# Patient Record
Sex: Male | Born: 1950 | Race: White | Hispanic: No | Marital: Single | Smoking: Former smoker
Health system: Southern US, Community
[De-identification: ages and names within clinical notes are randomized; demographics above are authoritative.]

## PROBLEM LIST (undated history)

## (undated) DIAGNOSIS — I420 Dilated cardiomyopathy: Secondary | ICD-10-CM

## (undated) DIAGNOSIS — N390 Urinary tract infection, site not specified: Secondary | ICD-10-CM

## (undated) DIAGNOSIS — I10 Essential (primary) hypertension: Secondary | ICD-10-CM

## (undated) DIAGNOSIS — M1990 Unspecified osteoarthritis, unspecified site: Secondary | ICD-10-CM

## (undated) DIAGNOSIS — N200 Calculus of kidney: Secondary | ICD-10-CM

## (undated) DIAGNOSIS — N1330 Unspecified hydronephrosis: Secondary | ICD-10-CM

## (undated) DIAGNOSIS — R739 Hyperglycemia, unspecified: Secondary | ICD-10-CM

## (undated) DIAGNOSIS — K8020 Calculus of gallbladder without cholecystitis without obstruction: Secondary | ICD-10-CM

## (undated) DIAGNOSIS — E119 Type 2 diabetes mellitus without complications: Secondary | ICD-10-CM

## (undated) HISTORY — PX: KNEE ARTHROSCOPY: SUR90

## (undated) HISTORY — PX: NOSE SURGERY: SHX723

## (undated) HISTORY — DX: Urinary tract infection, site not specified: N39.0

## (undated) HISTORY — DX: Dilated cardiomyopathy: I42.0

## (undated) HISTORY — DX: Unspecified osteoarthritis, unspecified site: M19.90

## (undated) HISTORY — DX: Type 2 diabetes mellitus without complications: E11.9

## (undated) HISTORY — DX: Calculus of gallbladder without cholecystitis without obstruction: K80.20

---

## 2009-08-20 ENCOUNTER — Ambulatory Visit: Payer: Self-pay | Admitting: Family Medicine

## 2009-08-26 ENCOUNTER — Ambulatory Visit (HOSPITAL_COMMUNITY): Admission: RE | Admit: 2009-08-26 | Discharge: 2009-08-26 | Payer: Self-pay | Admitting: Family Medicine

## 2009-09-09 ENCOUNTER — Encounter (INDEPENDENT_AMBULATORY_CARE_PROVIDER_SITE_OTHER): Payer: Self-pay | Admitting: Family Medicine

## 2009-09-09 ENCOUNTER — Ambulatory Visit: Payer: Self-pay | Admitting: Internal Medicine

## 2009-09-09 LAB — CONVERTED CEMR LAB
Alkaline Phosphatase: 77 units/L (ref 39–117)
Basophils Absolute: 0 10*3/uL (ref 0.0–0.1)
Basophils Relative: 0 % (ref 0–1)
Cholesterol: 302 mg/dL — ABNORMAL HIGH (ref 0–200)
Eosinophils Absolute: 0.2 10*3/uL (ref 0.0–0.7)
Eosinophils Relative: 3 % (ref 0–5)
Glucose, Bld: 284 mg/dL — ABNORMAL HIGH (ref 70–99)
HCT: 49.1 % (ref 39.0–52.0)
HDL: 37 mg/dL — ABNORMAL LOW (ref >39)
Hemoglobin: 16.3 g/dL (ref 13.0–17.0)
LDL Cholesterol: 204 mg/dL — ABNORMAL HIGH (ref 0–99)
Lymphocytes Relative: 27 % (ref 12–46)
MCV: 91.3 fL (ref 78.0–100.0)
Monocytes Absolute: 0.5 10*3/uL (ref 0.1–1.0)
RDW: 13.5 % (ref 11.5–15.5)
Sodium: 134 meq/L — ABNORMAL LOW (ref 135–145)
Total CHOL/HDL Ratio: 8.2
Total Protein: 7 g/dL (ref 6.0–8.3)
Triglycerides: 304 mg/dL — ABNORMAL HIGH (ref <150)
VLDL: 61 mg/dL — ABNORMAL HIGH (ref 0–40)

## 2009-09-18 ENCOUNTER — Ambulatory Visit: Payer: Self-pay | Admitting: Internal Medicine

## 2009-10-04 ENCOUNTER — Encounter (INDEPENDENT_AMBULATORY_CARE_PROVIDER_SITE_OTHER): Payer: Self-pay | Admitting: Family Medicine

## 2009-10-04 ENCOUNTER — Ambulatory Visit: Payer: Self-pay | Admitting: Internal Medicine

## 2009-10-04 LAB — CONVERTED CEMR LAB
ALT: 14 units/L (ref 0–53)
Albumin: 4.6 g/dL (ref 3.5–5.2)
Alkaline Phosphatase: 67 units/L (ref 39–117)
CO2: 25 meq/L (ref 19–32)
Calcium: 9.3 mg/dL (ref 8.4–10.5)
Chloride: 102 meq/L (ref 96–112)
Total Bilirubin: 0.4 mg/dL (ref 0.3–1.2)

## 2009-10-16 ENCOUNTER — Ambulatory Visit: Payer: Self-pay | Admitting: Internal Medicine

## 2018-04-04 ENCOUNTER — Encounter (HOSPITAL_COMMUNITY): Payer: Self-pay

## 2018-04-04 ENCOUNTER — Ambulatory Visit (HOSPITAL_COMMUNITY)
Admission: EM | Admit: 2018-04-04 | Discharge: 2018-04-04 | Disposition: A | Discharge status: Home / Self Care | Payer: Self-pay | Attending: Family Medicine | Admitting: Family Medicine

## 2018-04-04 DIAGNOSIS — R112 Nausea with vomiting, unspecified: Secondary | ICD-10-CM

## 2018-04-04 MED ORDER — ONDANSETRON 4 MG PO TBDP: 4.0000 mg | ORAL_TABLET | Freq: Three times a day (TID) | ORAL | 0 refills | Status: DC | PRN

## 2018-04-04 NOTE — ED Provider Notes (Signed)
[facility]    CSN: [id number] Arrival date & time: 04/04/18  1650     History   Chief Complaint Chief Complaint  Patient presents with  . Emesis  . URI    HPI LAMOUNT BANKSON is a 67 y.o. male.   Patient started vomiting about 48 hours ago.  He denies any pain but has had some hiccups.  He denies postnasal drainage and while in triage notes that he was having symptoms of congestion and drainage he denies that when I asked him.  He did have some wheezing last week but says that resolved.  Also denies cough.  HPI  History reviewed. No pertinent past medical history.  There are no active problems to display for this patient.   Past Surgical History:  Procedure Laterality Date  . KNEE ARTHROSCOPY    . NOSE SURGERY         Home Medications    Prior to Admission medications   Not on File    Family History History reviewed. No pertinent family history.  Social History Social History   Tobacco Use  . Smoking status: Not on file  Substance Use Topics  . Alcohol use: Not on file  . Drug use: Not on file     Allergies   Patient has no known allergies.   Review of Systems Review of Systems  Constitutional: Negative.   HENT: Negative.   Respiratory: Negative.   Cardiovascular: Negative.   Gastrointestinal: Positive for vomiting.  Genitourinary: Negative.   Musculoskeletal: Negative.   Neurological: Negative.      Physical Exam Triage Vital Signs ED Triage Vitals  Enc Vitals Group     BP 04/04/18 1746 121/80     Pulse Rate 04/04/18 1746 (!) 108     Resp 04/04/18 1746 18     Temp 04/04/18 1746 97.7 F (36.5 C)     Temp Source 04/04/18 1746 Oral     SpO2 04/04/18 1746 94 %     Weight --      Height --      Head Circumference --      Peak Flow --      Pain Score 04/04/18 1747 0     Pain Loc --      Pain Edu? --      Excl. in GC? --    No data found.  Updated Vital Signs BP 121/80 (BP Location: Left Arm)   Pulse (!) 108    Temp 97.7 F (36.5 C) (Oral)   Resp 18   SpO2 94%   Visual Acuity Right Eye Distance:   Left Eye Distance:   Bilateral Distance:    Right Eye Near:   Left Eye Near:    Bilateral Near:     Physical Exam  Constitutional: He is oriented to person, place, and time. He appears well-developed and well-nourished.  HENT:  Mouth/Throat: Oropharynx is clear and moist.  Cardiovascular: Normal rate and regular rhythm.  Pulmonary/Chest: Effort normal and breath sounds normal.  Abdominal: Soft. Bowel sounds are normal.  Neurological: He is alert and oriented to person, place, and time.     UC Treatments / Results  Labs (all labs ordered are listed, but only abnormal results are displayed) Labs Reviewed - No data to display  EKG None  Radiology No results found.  Procedures Procedures (including critical care time)  Medications Ordered in UC Medications - No data to display  Initial Impression / Assessment and Plan / UC  Course  I have reviewed the triage vital signs and the nursing notes.  Pertinent labs & imaging results that were available during my care of the patient were reviewed by me and considered in my medical decision making (see chart for details).     Vomiting of uncertain etiology.  Usual contributors including GI problems like ulcers pancreatitis cholecystitis are negative by history.  He denies postnasal drainage.  Will treat symptomatically with Zofran Final Clinical Impressions(s) / UC Diagnoses   Final diagnoses:  None   Discharge Instructions   None    ED Prescriptions    None     Controlled Substance Prescriptions [place] Controlled Substance Registry consulted? No   Frederica Kuster, MD 04/04/18 [phone]

## 2018-04-04 NOTE — ED Triage Notes (Signed)
Pt presents with vomiting and cold symptoms; nasal drainage and congestion.

## 2018-04-25 DIAGNOSIS — R69 Illness, unspecified: Secondary | ICD-10-CM | POA: Diagnosis not present

## 2018-04-25 DIAGNOSIS — I10 Essential (primary) hypertension: Secondary | ICD-10-CM | POA: Diagnosis not present

## 2018-04-25 DIAGNOSIS — R55 Syncope and collapse: Secondary | ICD-10-CM | POA: Diagnosis not present

## 2018-04-25 DIAGNOSIS — K529 Noninfective gastroenteritis and colitis, unspecified: Secondary | ICD-10-CM | POA: Diagnosis not present

## 2018-05-03 ENCOUNTER — Other Ambulatory Visit: Payer: Self-pay

## 2018-05-03 ENCOUNTER — Encounter (HOSPITAL_COMMUNITY): Payer: Self-pay | Admitting: Emergency Medicine

## 2018-05-03 ENCOUNTER — Inpatient Hospital Stay (HOSPITAL_COMMUNITY)
Admission: EM | Admit: 2018-05-03 | Discharge: 2018-05-19 | DRG: 216 | Disposition: A | Discharge status: Home Health | Payer: Medicare HMO | Attending: Thoracic Surgery (Cardiothoracic Vascular Surgery) | Admitting: Thoracic Surgery (Cardiothoracic Vascular Surgery)

## 2018-05-03 ENCOUNTER — Emergency Department (HOSPITAL_COMMUNITY): Payer: Medicare HMO

## 2018-05-03 DIAGNOSIS — Z23 Encounter for immunization: Secondary | ICD-10-CM | POA: Diagnosis not present

## 2018-05-03 DIAGNOSIS — I110 Hypertensive heart disease with heart failure: Principal | ICD-10-CM | POA: Diagnosis present

## 2018-05-03 DIAGNOSIS — D62 Acute posthemorrhagic anemia: Secondary | ICD-10-CM | POA: Diagnosis not present

## 2018-05-03 DIAGNOSIS — I509 Heart failure, unspecified: Secondary | ICD-10-CM | POA: Diagnosis not present

## 2018-05-03 DIAGNOSIS — I5021 Acute systolic (congestive) heart failure: Secondary | ICD-10-CM | POA: Diagnosis not present

## 2018-05-03 DIAGNOSIS — R05 Cough: Secondary | ICD-10-CM | POA: Diagnosis not present

## 2018-05-03 DIAGNOSIS — I5041 Acute combined systolic (congestive) and diastolic (congestive) heart failure: Secondary | ICD-10-CM | POA: Diagnosis not present

## 2018-05-03 DIAGNOSIS — I420 Dilated cardiomyopathy: Secondary | ICD-10-CM | POA: Diagnosis present

## 2018-05-03 DIAGNOSIS — J810 Acute pulmonary edema: Secondary | ICD-10-CM | POA: Diagnosis not present

## 2018-05-03 DIAGNOSIS — I248 Other forms of acute ischemic heart disease: Secondary | ICD-10-CM

## 2018-05-03 DIAGNOSIS — Z87891 Personal history of nicotine dependence: Secondary | ICD-10-CM

## 2018-05-03 DIAGNOSIS — R079 Chest pain, unspecified: Secondary | ICD-10-CM | POA: Diagnosis not present

## 2018-05-03 DIAGNOSIS — I25110 Atherosclerotic heart disease of native coronary artery with unstable angina pectoris: Secondary | ICD-10-CM | POA: Diagnosis not present

## 2018-05-03 DIAGNOSIS — I959 Hypotension, unspecified: Secondary | ICD-10-CM | POA: Diagnosis present

## 2018-05-03 DIAGNOSIS — I340 Nonrheumatic mitral (valve) insufficiency: Secondary | ICD-10-CM | POA: Diagnosis not present

## 2018-05-03 DIAGNOSIS — I214 Non-ST elevation (NSTEMI) myocardial infarction: Secondary | ICD-10-CM | POA: Diagnosis present

## 2018-05-03 DIAGNOSIS — Z79899 Other long term (current) drug therapy: Secondary | ICD-10-CM | POA: Diagnosis not present

## 2018-05-03 DIAGNOSIS — Z823 Family history of stroke: Secondary | ICD-10-CM

## 2018-05-03 DIAGNOSIS — R7989 Other specified abnormal findings of blood chemistry: Secondary | ICD-10-CM

## 2018-05-03 DIAGNOSIS — Z951 Presence of aortocoronary bypass graft: Secondary | ICD-10-CM

## 2018-05-03 DIAGNOSIS — Z01810 Encounter for preprocedural cardiovascular examination: Secondary | ICD-10-CM | POA: Diagnosis not present

## 2018-05-03 DIAGNOSIS — R059 Cough, unspecified: Secondary | ICD-10-CM

## 2018-05-03 DIAGNOSIS — I255 Ischemic cardiomyopathy: Secondary | ICD-10-CM | POA: Diagnosis present

## 2018-05-03 DIAGNOSIS — I10 Essential (primary) hypertension: Secondary | ICD-10-CM

## 2018-05-03 DIAGNOSIS — J9811 Atelectasis: Secondary | ICD-10-CM | POA: Diagnosis not present

## 2018-05-03 DIAGNOSIS — I5043 Acute on chronic combined systolic (congestive) and diastolic (congestive) heart failure: Secondary | ICD-10-CM | POA: Diagnosis not present

## 2018-05-03 DIAGNOSIS — E1165 Type 2 diabetes mellitus with hyperglycemia: Secondary | ICD-10-CM | POA: Diagnosis present

## 2018-05-03 DIAGNOSIS — I4891 Unspecified atrial fibrillation: Secondary | ICD-10-CM | POA: Diagnosis not present

## 2018-05-03 DIAGNOSIS — J811 Chronic pulmonary edema: Secondary | ICD-10-CM | POA: Diagnosis not present

## 2018-05-03 DIAGNOSIS — E119 Type 2 diabetes mellitus without complications: Secondary | ICD-10-CM | POA: Diagnosis not present

## 2018-05-03 DIAGNOSIS — R531 Weakness: Secondary | ICD-10-CM | POA: Diagnosis not present

## 2018-05-03 DIAGNOSIS — I272 Pulmonary hypertension, unspecified: Secondary | ICD-10-CM | POA: Diagnosis present

## 2018-05-03 DIAGNOSIS — K5900 Constipation, unspecified: Secondary | ICD-10-CM | POA: Diagnosis not present

## 2018-05-03 DIAGNOSIS — R0602 Shortness of breath: Secondary | ICD-10-CM | POA: Diagnosis not present

## 2018-05-03 DIAGNOSIS — I25810 Atherosclerosis of coronary artery bypass graft(s) without angina pectoris: Secondary | ICD-10-CM

## 2018-05-03 DIAGNOSIS — I2510 Atherosclerotic heart disease of native coronary artery without angina pectoris: Secondary | ICD-10-CM | POA: Diagnosis not present

## 2018-05-03 DIAGNOSIS — Z954 Presence of other heart-valve replacement: Secondary | ICD-10-CM | POA: Diagnosis not present

## 2018-05-03 DIAGNOSIS — E875 Hyperkalemia: Secondary | ICD-10-CM | POA: Diagnosis present

## 2018-05-03 DIAGNOSIS — I25119 Atherosclerotic heart disease of native coronary artery with unspecified angina pectoris: Secondary | ICD-10-CM | POA: Diagnosis not present

## 2018-05-03 DIAGNOSIS — J90 Pleural effusion, not elsewhere classified: Secondary | ICD-10-CM | POA: Diagnosis not present

## 2018-05-03 DIAGNOSIS — E785 Hyperlipidemia, unspecified: Secondary | ICD-10-CM | POA: Diagnosis present

## 2018-05-03 DIAGNOSIS — Z9889 Other specified postprocedural states: Secondary | ICD-10-CM

## 2018-05-03 DIAGNOSIS — E782 Mixed hyperlipidemia: Secondary | ICD-10-CM

## 2018-05-03 DIAGNOSIS — T17920A Food in respiratory tract, part unspecified causing asphyxiation, initial encounter: Secondary | ICD-10-CM | POA: Diagnosis not present

## 2018-05-03 DIAGNOSIS — R110 Nausea: Secondary | ICD-10-CM | POA: Diagnosis not present

## 2018-05-03 DIAGNOSIS — Z8249 Family history of ischemic heart disease and other diseases of the circulatory system: Secondary | ICD-10-CM | POA: Diagnosis not present

## 2018-05-03 DIAGNOSIS — I341 Nonrheumatic mitral (valve) prolapse: Secondary | ICD-10-CM | POA: Diagnosis not present

## 2018-05-03 DIAGNOSIS — I493 Ventricular premature depolarization: Secondary | ICD-10-CM | POA: Diagnosis not present

## 2018-05-03 LAB — GLUCOSE, CAPILLARY: GLUCOSE-CAPILLARY: 123 mg/dL — AB (ref 70–99)

## 2018-05-03 LAB — COMPREHENSIVE METABOLIC PANEL
ALT: 35 U/L (ref 0–44)
ANION GAP: 13 (ref 5–15)
AST: 34 U/L (ref 15–41)
Albumin: 3.7 g/dL (ref 3.5–5.0)
Alkaline Phosphatase: 45 U/L (ref 38–126)
BUN: 9 mg/dL (ref 8–23)
CALCIUM: 9.1 mg/dL (ref 8.9–10.3)
CO2: 21 mmol/L — AB (ref 22–32)
Chloride: 97 mmol/L — ABNORMAL LOW (ref 98–111)
Creatinine, Ser: 1.12 mg/dL (ref 0.61–1.24)
GFR calc Af Amer: >60 mL/min (ref >60)
Glucose, Bld: 176 mg/dL — ABNORMAL HIGH (ref 70–99)
POTASSIUM: 4.2 mmol/L (ref 3.5–5.1)
Sodium: 131 mmol/L — ABNORMAL LOW (ref 135–145)
TOTAL PROTEIN: 6.8 g/dL (ref 6.5–8.1)
Total Bilirubin: 1.9 mg/dL — ABNORMAL HIGH (ref 0.3–1.2)

## 2018-05-03 LAB — TROPONIN I
Troponin I: 0.05 ng/mL (ref <0.03)
Troponin I: 0.09 ng/mL (ref <0.03)

## 2018-05-03 LAB — CBC WITH DIFFERENTIAL/PLATELET
Abs Immature Granulocytes: 0.03 10*3/uL (ref 0.00–0.07)
BASOS ABS: 0.1 10*3/uL (ref 0.0–0.1)
BASOS PCT: 1 %
Eosinophils Absolute: 0.1 10*3/uL (ref 0.0–0.5)
Eosinophils Relative: 1 %
HCT: 45.3 % (ref 39.0–52.0)
Hemoglobin: 14.1 g/dL (ref 13.0–17.0)
IMMATURE GRANULOCYTES: 0 %
Lymphocytes Relative: 20 %
Lymphs Abs: 1.6 10*3/uL (ref 0.7–4.0)
MCH: 29.4 pg (ref 26.0–34.0)
MCHC: 31.1 g/dL (ref 30.0–36.0)
MCV: 94.4 fL (ref 80.0–100.0)
Monocytes Absolute: 0.8 10*3/uL (ref 0.1–1.0)
Monocytes Relative: 10 %
NEUTROS ABS: 5.5 10*3/uL (ref 1.7–7.7)
NEUTROS PCT: 68 %
NRBC: 0 % (ref 0.0–0.2)
PLATELETS: 363 10*3/uL (ref 150–400)
RBC: 4.8 MIL/uL (ref 4.22–5.81)
RDW: 13.4 % (ref 11.5–15.5)
WBC: 8.1 10*3/uL (ref 4.0–10.5)

## 2018-05-03 LAB — I-STAT TROPONIN, ED: TROPONIN I, POC: 0.09 ng/mL — AB (ref 0.00–0.08)

## 2018-05-03 LAB — CBG MONITORING, ED: GLUCOSE-CAPILLARY: 144 mg/dL — AB (ref 70–99)

## 2018-05-03 LAB — BRAIN NATRIURETIC PEPTIDE: B NATRIURETIC PEPTIDE 5: 2703.3 pg/mL — AB (ref 0.0–100.0)

## 2018-05-03 MED ORDER — INSULIN ASPART 100 UNIT/ML ~~LOC~~ SOLN: 0.0000 [IU] | Freq: Every day | SUBCUTANEOUS | Status: DC

## 2018-05-03 MED ORDER — ENOXAPARIN SODIUM 40 MG/0.4ML ~~LOC~~ SOLN: 40.0000 mg | SUBCUTANEOUS | Status: DC

## 2018-05-03 MED ORDER — ATORVASTATIN CALCIUM 80 MG PO TABS
80.0000 mg | ORAL_TABLET | Freq: Every day | ORAL | Status: DC
  Administered 2018-05-03 – 2018-05-12 (×10): 80 mg via ORAL
  Filled 2018-05-03 (×11): qty 1

## 2018-05-03 MED ORDER — ONDANSETRON HCL 4 MG PO TABS: 4.0000 mg | ORAL_TABLET | Freq: Four times a day (QID) | ORAL | Status: DC | PRN

## 2018-05-03 MED ORDER — ACETAMINOPHEN 650 MG RE SUPP: 650.0000 mg | Freq: Four times a day (QID) | RECTAL | Status: DC | PRN

## 2018-05-03 MED ORDER — FUROSEMIDE 10 MG/ML IJ SOLN
40.0000 mg | Freq: Four times a day (QID) | INTRAMUSCULAR | Status: DC
  Administered 2018-05-04 – 2018-05-05 (×7): 40 mg via INTRAVENOUS
  Filled 2018-05-03 (×7): qty 4

## 2018-05-03 MED ORDER — FUROSEMIDE 10 MG/ML IJ SOLN
40.0000 mg | Freq: Two times a day (BID) | INTRAMUSCULAR | Status: DC
  Administered 2018-05-03: 40 mg via INTRAVENOUS
  Filled 2018-05-03: qty 4

## 2018-05-03 MED ORDER — SODIUM CHLORIDE 0.9% FLUSH: 3.0000 mL | INTRAVENOUS | Status: DC | PRN

## 2018-05-03 MED ORDER — SODIUM CHLORIDE 0.9 % IV SOLN
250.0000 mL | INTRAVENOUS | Status: DC | PRN
  Administered 2018-05-13: 15:00:00 via INTRAVENOUS

## 2018-05-03 MED ORDER — ONDANSETRON HCL 4 MG/2ML IJ SOLN: 4.0000 mg | Freq: Four times a day (QID) | INTRAMUSCULAR | Status: DC | PRN

## 2018-05-03 MED ORDER — ZOLPIDEM TARTRATE 5 MG PO TABS: 5.0000 mg | ORAL_TABLET | Freq: Every evening | ORAL | Status: DC | PRN

## 2018-05-03 MED ORDER — SODIUM CHLORIDE 0.9% FLUSH
3.0000 mL | Freq: Two times a day (BID) | INTRAVENOUS | Status: DC
  Administered 2018-05-03 – 2018-05-12 (×10): 3 mL via INTRAVENOUS

## 2018-05-03 MED ORDER — HEPARIN (PORCINE) 25000 UT/250ML-% IV SOLN
1400.0000 [IU]/h | INTRAVENOUS | Status: DC
  Administered 2018-05-03: 950 [IU]/h via INTRAVENOUS
  Filled 2018-05-03: qty 250

## 2018-05-03 MED ORDER — INSULIN ASPART 100 UNIT/ML ~~LOC~~ SOLN
0.0000 [IU] | Freq: Three times a day (TID) | SUBCUTANEOUS | Status: DC
  Administered 2018-05-03 – 2018-05-06 (×5): 1 [IU] via SUBCUTANEOUS
  Administered 2018-05-07 – 2018-05-08 (×2): 2 [IU] via SUBCUTANEOUS
  Administered 2018-05-08: 1 [IU] via SUBCUTANEOUS
  Administered 2018-05-08 – 2018-05-09 (×2): 2 [IU] via SUBCUTANEOUS
  Administered 2018-05-09 – 2018-05-10 (×2): 1 [IU] via SUBCUTANEOUS
  Administered 2018-05-10: 2 [IU] via SUBCUTANEOUS
  Administered 2018-05-10 – 2018-05-11 (×3): 1 [IU] via SUBCUTANEOUS
  Administered 2018-05-11 – 2018-05-12 (×2): 2 [IU] via SUBCUTANEOUS
  Filled 2018-05-03: qty 1

## 2018-05-03 MED ORDER — IPRATROPIUM-ALBUTEROL 0.5-2.5 (3) MG/3ML IN SOLN
3.0000 mL | Freq: Once | RESPIRATORY_TRACT | Status: AC
  Administered 2018-05-03: 3 mL via RESPIRATORY_TRACT
  Filled 2018-05-03: qty 3

## 2018-05-03 MED ORDER — FUROSEMIDE 10 MG/ML IJ SOLN
40.0000 mg | Freq: Once | INTRAMUSCULAR | Status: AC
  Administered 2018-05-03: 40 mg via INTRAVENOUS
  Filled 2018-05-03: qty 4

## 2018-05-03 MED ORDER — INFLUENZA VAC SPLIT QUAD 0.5 ML IM SUSY
0.5000 mL | PREFILLED_SYRINGE | INTRAMUSCULAR | Status: AC
  Administered 2018-05-08: 0.5 mL via INTRAMUSCULAR
  Filled 2018-05-03: qty 0.5

## 2018-05-03 MED ORDER — ACETAMINOPHEN 325 MG PO TABS: 650.0000 mg | ORAL_TABLET | Freq: Four times a day (QID) | ORAL | Status: DC | PRN

## 2018-05-03 MED ORDER — HEPARIN BOLUS VIA INFUSION
4000.0000 [IU] | Freq: Once | INTRAVENOUS | Status: AC
  Administered 2018-05-03: 4000 [IU] via INTRAVENOUS
  Filled 2018-05-03: qty 4000

## 2018-05-03 NOTE — H&P (Signed)
[facility]                                                                                    Patient Demographics  Michael Banks, is a 67 y.o. male  CSN: [id number]  MRN: [id number]  DOB - 06/05/1951  Admit Date - 05/03/2018  Outpatient Primary MD for the patient is Patient, No Pcp Per   With History of -  No past medical history on file.    Past Surgical History:  Procedure Laterality Date  . KNEE ARTHROSCOPY    . NOSE SURGERY      in for   Chief Complaint  Patient presents with  . Shortness of Breath     HPI  Michael Banks  is a 67 y.o. male, with past medical history significant for self treated hypertension on no medications, presenting with 1 week history of increasing shortness of breath but no chest pain or lower extremity edema.  Patient has no history of coronary artery disease or congestive heart failure but reports that around 1 week ago he started having left arm pain and numbness on exercise that was relieved by sitting down.  Patient denies frank chest pain or palpitations.  In the emergency room the patient was found to be in pulmonary edema and his BNP was 2703.  His troponin was 0.09.  Cardiology was consulted as well    Review of Systems    In addition to the HPI above,  No Fever-chills, No Headache, No changes with Vision or hearing, No problems swallowing food or Liquids, No Chest pain,  No Abdominal pain, No Nausea or Vommitting, Bowel movements are regular, No Blood in stool or Urine, No dysuria, No new skin rashes or bruises, No new joints pains-aches,  No new weakness, tingling, numbness in any extremity, No recent weight gain or loss, No polyuria, polydypsia or polyphagia, No significant Mental Stressors.  A full 10 point Review of Systems was done, except as stated above, all other Review of Systems were negative.   Social History Social History   Tobacco Use  . Smoking status: Former Games developermoker  . Smokeless  tobacco: Never Used  Substance Use Topics  . Alcohol use: Never    Frequency: Never     Family History  For hypertension in his father  Prior to Admission medications   Medication Sig Start Date End Date Taking? Authorizing Provider  DM-Doxylamine-Acetaminophen (NYQUIL COLD & FLU PO) Take 15 mLs by mouth as needed (cold).   Yes [provider]  ondansetron (ZOFRAN ODT) 4 MG disintegrating tablet Take 1 tablet (4 mg total) by mouth every 8 (eight) hours as needed for nausea or vomiting. 04/04/18  Yes Frederica KusterMiller, Stephen M, MD    No Known Allergies  Physical Exam  Vitals  Blood pressure 106/81, pulse (!) 107, resp. rate (!) 22, SpO2 98 %.   1. General Young male, well-developed, anxious  2. Not Suicidal or Homicidal, Awake Alert, Oriented X 3.  3. No F.N deficits, grossly.  4. Ears and Eyes appear Normal, Conjunctivae clear, PERRLA. Moist Oral Mucosa.  5. Supple Neck, No JVD, No cervical lymphadenopathy appriciated, No Carotid Bruits.  6. Symmetrical Chest wall movement, bilateral basilar crackles with decreased breath sounds at the bases as well.  7. RRR, tachycardic.  8. Positive Bowel Sounds, Abdomen Soft, Non tender, No organomegaly appriciated,No rebound -guarding or rigidity.  9.  No Cyanosis, Normal Skin Turgor, No Skin Rash or Bruise.  10. Good muscle tone,  joints appear normal , no effusions, Normal ROM.    Data Review  CBC Recent Labs  Lab 05/03/18 1324  WBC 8.1  HGB 14.1  HCT 45.3  PLT 363  MCV 94.4  MCH 29.4  MCHC 31.1  RDW 13.4  LYMPHSABS 1.6  MONOABS 0.8  EOSABS 0.1  BASOSABS 0.1   ------------------------------------------------------------------------------------------------------------------  Chemistries  Recent Labs  Lab 05/03/18 1324  NA 131*  K 4.2  CL 97*  CO2 21*  GLUCOSE 176*  BUN 9  CREATININE 1.12  CALCIUM 9.1  AST 34  ALT 35  ALKPHOS 45  BILITOT 1.9*    ------------------------------------------------------------------------------------------------------------------ CrCl cannot be calculated (Unknown ideal weight.). ------------------------------------------------------------------------------------------------------------------ No results for input(s): TSH, T4TOTAL, T3FREE, THYROIDAB in the last 72 hours.  Invalid input(s): FREET3   Coagulation profile No results for input(s): INR, PROTIME in the last 168 hours. ------------------------------------------------------------------------------------------------------------------- No results for input(s): DDIMER in the last 72 hours. -------------------------------------------------------------------------------------------------------------------  Cardiac Enzymes No results for input(s): CKMB, TROPONINI, MYOGLOBIN in the last 168 hours.  Invalid input(s): CK ------------------------------------------------------------------------------------------------------------------ Invalid input(s): POCBNP   ---------------------------------------------------------------------------------------------------------------  Urinalysis No results found for: COLORURINE, APPEARANCEUR, LABSPEC, PHURINE, GLUCOSEU, HGBUR, BILIRUBINUR, KETONESUR, PROTEINUR, UROBILINOGEN, NITRITE, LEUKOCYTESUR  ----------------------------------------------------------------------------------------------------------------   Imaging results:   Dg Chest 2 View  Result Date: 05/03/2018 CLINICAL DATA:  Cough and shortness of breath EXAM: CHEST - 2 VIEW COMPARISON:  None. FINDINGS: There are small pleural effusions bilaterally. There is atelectatic change in the left base. There is cardiomegaly with mild pulmonary venous hypertension. There is underlying interstitial edema. No frank consolidation. No adenopathy evident. There is degenerative change in the thoracic spine. IMPRESSION: Pulmonary vascular congestion. Small  pleural effusions bilaterally with interstitial edema. There may be a degree of underlying congestive heart failure. No frank consolidation, although there is atelectatic change in the left base. Electronically Signed   By: Bretta Bang III M.D.   On: 05/03/2018 13:51    My personal review of EKG: Sinus tach at 113 bpm with LVH  Assessment & Plan  Pulmonary edema/congestive heart failure, unknown type Check echocardiogram Serial troponins Lasix IV Cardiology consulted  Elevated troponins Check serial troponin  History of hypertension, not on medications  Elevated blood sugar, check hemoglobin A1c and start insulin sliding scale   DVT Prophylaxis Lovenox  AM Labs Ordered, also please review Full Orders    Code Status full  Disposition Plan: Home  Time spent in minutes : 44 minutes  Condition GUARDED   @SIGNATURE @

## 2018-05-03 NOTE — ED Notes (Signed)
Pt transported to xray 

## 2018-05-03 NOTE — Progress Notes (Signed)
ANTICOAGULATION CONSULT NOTE - Initial Consult  Pharmacy Consult for Heparin Indication: chest pain/ACS  No Known Allergies  Patient Measurements: Height: 5\' 10"  (177.8 cm) Weight: 175 lb (79.4 kg) IBW/kg (Calculated) : 73 Heparin Dosing Weight: 79.4   Vital Signs: BP: 106/78 (11/12 1845) Pulse Rate: 106 (11/12 1845)  Labs: Recent Labs    05/03/18 1324 05/03/18 1706  HGB 14.1  --   HCT 45.3  --   PLT 363  --   CREATININE 1.12  --   TROPONINI  --  0.05*    Estimated Creatinine Clearance: 68.8 mL/min (by C-G formula based on SCr of 1.12 mg/dL).   Medical History: No past medical history on file.  Assessment: Patient is 25 yoM presenting with shortness of breath and left arm pain with elevated troponins. Pharmacy consulted for heparin dosing. No anticoagulation PTA. CBC wnl.   Goal of Therapy:  Heparin level 0.3-0.7 units/ml Monitor platelets by anticoagulation protocol: Yes   Plan:  Give 4000 units bolus x 1 Start heparin infusion at 950 units/hr Check anti-Xa level in 6 hours and daily while on heparin Continue to monitor H&H and platelets  Monitor s/sx bleed  Chauncey Mann, Pharmacy Student

## 2018-05-03 NOTE — ED Notes (Signed)
Pt satting 88% on room air while standing to urinate. Pt placed on 2L O2, brought up to 96% and returned to bed.

## 2018-05-03 NOTE — Progress Notes (Signed)
  Vital Signs MEWS/VS Documentation      05/03/2018 1845 05/03/2018 1900 05/03/2018 1930 05/03/2018 2002   MEWS Score:  1  0  0  2   MEWS Score Color:  Green  Green  Green  Yellow   Resp:  16  16  -  20   Pulse:  (!) 106  100  99  (!) 105   BP:  106/78  110/85  102/83  92/70   Temp:  -  -  -  98.6 F (37 C)   O2 Device:  -  Nasal Cannula  -  Nasal Cannula   O2 Flow Rate (L/min):  -  2 L/min  -  2 L/min           Suzan SlickAshley N Tobias-Diakun 05/03/2018,10:39 PM

## 2018-05-03 NOTE — Consult Note (Addendum)
Cardiology Consult    Patient ID: Michael Banks MRN: [id number], DOB/AGE: 02/19/1951   Admit date: 05/03/2018 Date of Consult: 05/03/2018  Primary Physician: Patient, No Pcp Per Primary Cardiologist: Tobias Alexander, MD (New Consult) Requesting Provider: Geoffery Lyons, MD (Emergency Department)  Patient Profile    Michael Banks is a 67 y.o. male with a history of hypertension, hyperlipidemia, and tobacco abuse, who is being seen today for the evaluation of shortness of breath at the request of Dr. Judd Lien.  History of Present Illness    Mr. Michael Banks is a 67 year old male with no known CAD who does has never had any cardiac workup. Patient recently presented to the ED on 04/04/2018 with nausea and vomiting. Etiology unknown but patient was discharged with Zofran. Since then, patient reports worsening shortness of breath with exertion, orthopnea, and PND over the last 3 weeks as well as general fatigue and weakness. Patient reports shortness of breath with minimal exertion and has even had to stop and sit down and catch his breath. His orthopnea and PND have also been so bad that he has not been able to get much sleep. He has noticed worsening abdominal bloating over the past couple of weeks as well. Patient also reports a productive cough for the past week. He denies any chest pain but does report a couple of episodes of left arm pain with exertion over the last 1.5 months that resolved with Aspirin. Patient states he watches his sodium intake and typically follows the DASH diet. He was previously diagnosed with hypertension and hyperlipidemia and was started on Atenolol and Lipitor but those medications were reportedly discontinued by his PCP due to improvement of his blood pressure and cholesterol. He last saw his PCP last week after going 10 years without being seen.   Upon arrival to the ED, patient mildly tachycardic at 113 bpm but vitals stable. EKG showed sinus tachycardia (rate 113 bpm)  and LVH with secondary repolarization abnormalities. I-stat troponin minimally elevated at 0.09. Chest x-ray showed pulmonary vascular congestion with small pleural effusions bilaterally and interstitial edema. BNP elevated at 2,703.3. CBC unremarkable. Na 131, K 4.2, Glucose 176, SCr 1.12. Patient given IV Lasix 40mg  and a Duoneb in the ED.   Patient has a 30+ pack year smoking history but recently quit last week after moving into a place that does not allow smoking. He reports previous alcohol and marijuana use but none currently. He does have a family history of CAD with his brother dying of a "heart attack" at 62 years old.   Past Medical History   No past medical history on file.  Past Surgical History:  Procedure Laterality Date  . KNEE ARTHROSCOPY    . NOSE SURGERY       Allergies  No Known Allergies  Inpatient Medications      Family History    Family History  Problem Relation Age of Onset  . CVA Father        died of "cerebral hemorrhage"  . CAD Brother        died of "heart attack" at age 32   He indicated that his mother is deceased. He indicated that his father is deceased. He indicated that the status of his brother is unknown.   Social History    Social History   Socioeconomic History  . Marital status: Single    Spouse name: Not on file  . Number of children: Not on file  . Years of  education: Not on file  . Highest education level: Not on file  Occupational History  . Not on file  Social Needs  . Financial resource strain: Not on file  . Food insecurity:    Worry: Not on file    Inability: Not on file  . Transportation needs:    Medical: Not on file    Non-medical: Not on file  Tobacco Use  . Smoking status: Former Games developer  . Smokeless tobacco: Never Used  Substance and Sexual Activity  . Alcohol use: Never    Frequency: Never  . Drug use: Not on file  . Sexual activity: Not on file  Lifestyle  . Physical activity:    Days per week: Not on  file    Minutes per session: Not on file  . Stress: Not on file  Relationships  . Social connections:    Talks on phone: Not on file    Gets together: Not on file    Attends religious service: Not on file    Active member of club or organization: Not on file    Attends meetings of clubs or organizations: Not on file    Relationship status: Not on file  . Intimate partner violence:    Fear of current or ex partner: Not on file    Emotionally abused: Not on file    Physically abused: Not on file    Forced sexual activity: Not on file  Other Topics Concern  . Not on file  Social History Narrative  . Not on file     Review of Systems    Review of Systems  Constitutional: Positive for malaise/fatigue. Negative for chills, diaphoresis and fever.  HENT: Negative for congestion and sore throat.   Eyes: Positive for blurred vision.  Respiratory: Positive for cough, sputum production and shortness of breath.   Cardiovascular: Positive for orthopnea, leg swelling and PND. Negative for chest pain and palpitations.  Gastrointestinal: Negative for abdominal pain, diarrhea, nausea and vomiting.  Musculoskeletal: Negative for falls and myalgias.       Left arm pain with exertion  Skin: Positive for rash (eczema).  Neurological: Positive for tingling (feet at night) and weakness. Negative for dizziness, loss of consciousness and headaches.  Endo/Heme/Allergies: Negative for environmental allergies.  Psychiatric/Behavioral: Positive for substance abuse (tobacco use).    Physical Exam    Blood pressure (!) 112/91, pulse (!) 108, resp. rate 17, SpO2 94 %.  General: 67 y.o. Caucasian male resting comfortably in no acute distress. Pleasant and cooperative. Currently on supplemental O2 via nasal cannula. HEENT: Normal  Neck: Supple. Elevated JVD. No carotid bruits. Lungs: No increased work of breathing. Minimal crackles in right lower lobe but lungs relatively clear. No wheezes or rhonchi  appreciated. Heart: Tachycardic with regular rhythm. Distinct S1 and S2. II/VI systolic murmur best heard at apex. No gallops or rubs appreciated. Abdomen: Soft, non-distended, and non-tender to palpation. Bowel sounds present.   Extremities: Mild bilateral lower extremity edema. Radial pulses 2+ and equal bilaterally. Neuro: Alert and oriented x3. No focal deficits. Moves all extremities spontaneously. Psych: Normal affect.  Labs    Troponin (Point of Care Test) Recent Labs    05/03/18 1328  TROPIPOC 0.09*   No results for input(s): CKTOTAL, CKMB, TROPONINI in the last 72 hours. Lab Results  Component Value Date   WBC 8.1 05/03/2018   HGB 14.1 05/03/2018   HCT 45.3 05/03/2018   MCV 94.4 05/03/2018   PLT 363 05/03/2018  Recent Labs  Lab 05/03/18 1324  NA 131*  K 4.2  CL 97*  CO2 21*  BUN 9  CREATININE 1.12  CALCIUM 9.1  PROT 6.8  BILITOT 1.9*  ALKPHOS 45  ALT 35  AST 34  GLUCOSE 176*   Lab Results  Component Value Date   CHOL 302 (H) 09/09/2009   HDL 37 (L) 09/09/2009   LDLCALC 204 (H) 09/09/2009   TRIG 304 (H) 09/09/2009   No results found for: [facility]   Radiology Studies    Dg Chest 2 View  Result Date: 05/03/2018 CLINICAL DATA:  Cough and shortness of breath EXAM: CHEST - 2 VIEW COMPARISON:  None. FINDINGS: There are small pleural effusions bilaterally. There is atelectatic change in the left base. There is cardiomegaly with mild pulmonary venous hypertension. There is underlying interstitial edema. No frank consolidation. No adenopathy evident. There is degenerative change in the thoracic spine. IMPRESSION: Pulmonary vascular congestion. Small pleural effusions bilaterally with interstitial edema. There may be a degree of underlying congestive heart failure. No frank consolidation, although there is atelectatic change in the left base. Electronically Signed   By: Bretta Bang III M.D.   On: 05/03/2018 13:51    EKG     EKG: EKG was personally  reviewed and demonstrates: Sinus tachycardia, rate 113 bpm, and LVH with repolarization abnormalities (no prior tracings available for comparison).   Telemetry: Telemetry was personally reviewed and demonstrates: Sinus tachycardia with rate between 100s and 110s. bpm.  Cardiac Imaging    None. Echo pending.  Assessment & Plan    1. Acute Congestive Heart Failure  - Patient presents with worsening shortness of breath, orthopnea, and PND over the last 3 weeks.  - Chest x-ray showed pulmonary vascular congestion with small pleural effusions bilaterally and interstitial edema.  - BNP elevated at 2,703.3. - Patient given IV Lasix 40mg  and Duoneb in the ED. - Will check Echo.  - Continue diuresis with IV Lasix 40mg  twice daily.   - Pending Echo results, may need to add evidence based heart failure medications. If results show reduced EF or wall motion abnormalities, will likely need ischemic workup.  - Monitor daily weights and strict I's&O's. - Monitor renal function daily while being diuresed.   2. Elevated Troponin - Patient denies any current angina or any history of exertional chest pain but does note multiple episodes of left arm pain with exertion over the past 1.5 months which resolves with Aspirin.  - EKG showed sinus tachycardia and LVH with secondary repolarization abnormalities (no previous tracing available for comparison).  - I-stat troponin minimally elevated at 0.09. - Will cycle troponin. - Will check Echo. - Will check fasting lipid panel and Hgb A1c. - Troponin may be elevated due to demand ischemia in the setting of acute CHF and tachycardia. If Echo reveals reduced EF or wall motion abnormalities, may need further ischemic evaluation with cardiac catheterization.  Signed, Corrin Parker, PA-C 05/03/2018, 5:11 PM  For questions or updates, please contact   Please consult [email or web address] for contact info under Cardiology/STEMI.  The patient was seen, examined and  discussed with Corrin Parker, PA-C and I agree with the above.    67 y.o. male with a history of hypertension, hyperlipidemia, and tobacco abuse, with FH of early CAD in his brother who presented with DOE x 1 months, progressively worsening as well as 2 pillow orthopnea, PND, cough, no fever, no chest pain.  BNP 2700, Crea 1.1, Hb 14.1, Troponin 0.09.  LDL 204. TG 304. Physical exam shows JVDs up to jaw, S1,2, mild systolic murmur, B/L crackles up to mid lungs, no significant pulmonary edema. ECG shows SR, Q wave in the anterior leads, non specific changes.  A/P Acute CHF (echo pending) Elevated troponin Untreated HTN HLP  Start heparin infusion, lasix 40 mg iv Q6H, order echocardiogram, trand troponin, if increases --Cath in the am (keep NPO), if downtrending or flat - coronary CTA.  Tobias Alexander, MD 05/03/2018

## 2018-05-03 NOTE — ED Notes (Signed)
Pt requesting rest

## 2018-05-03 NOTE — ED Provider Notes (Signed)
[facility] EMERGENCY DEPARTMENT Provider Note   CSN: [id number] Arrival date & time: 05/03/18    History   Chief Complaint Chief Complaint  Patient presents with  . Shortness of Breath    HPI Michael Banks is a 67 y.o. male.  Patient is a 67 year old male with past medical history of tobacco abuse presenting with complaints of difficulty breathing.  This is been worsening for the past several days.  Patient has an extensive history of tobacco use, however quit approximately 1 week ago due to his breathing.  He was seen by his primary doctor, however no tests were performed.  Patient denies chest pain.  He does report cough and chest congestion, however cannot cough anything up.  The history is provided by the patient.  Shortness of Breath  This is a new problem. The average episode lasts 3 weeks. The problem occurs continuously.The problem has been gradually worsening. Associated symptoms include cough. Pertinent negatives include no fever, no sputum production, no chest pain, no leg pain and no leg swelling. It is unknown what precipitated the problem. He has tried nothing for the symptoms.    No past medical history on file.  There are no active problems to display for this patient.   Past Surgical History:  Procedure Laterality Date  . KNEE ARTHROSCOPY    . NOSE SURGERY          Home Medications    Prior to Admission medications   Medication Sig Start Date End Date Taking? Authorizing Provider  ondansetron (ZOFRAN ODT) 4 MG disintegrating tablet Take 1 tablet (4 mg total) by mouth every 8 (eight) hours as needed for nausea or vomiting. 04/04/18   Frederica Kuster, MD    Family History No family history on file.  Social History Social History   Tobacco Use  . Smoking status: Former Games developer  . Smokeless tobacco: Never Used  Substance Use Topics  . Alcohol use: Never    Frequency: Never  . Drug use: Not on file     Allergies     Patient has no known allergies.   Review of Systems Review of Systems  Constitutional: Negative for fever.  Respiratory: Positive for cough and shortness of breath. Negative for sputum production.   Cardiovascular: Negative for chest pain and leg swelling.  All other systems reviewed and are negative.    Physical Exam Updated Vital Signs BP 119/87 (BP Location: Right Arm)   Pulse 91   Resp 20   SpO2 100%   Physical Exam  Constitutional: He is oriented to person, place, and time. He appears well-developed and well-nourished. No distress.  HENT:  Head: Normocephalic and atraumatic.  Mouth/Throat: Oropharynx is clear and moist.  Neck: Normal range of motion. Neck supple.  Cardiovascular: Normal rate and regular rhythm. Exam reveals no friction rub.  No murmur heard. Pulmonary/Chest: Effort normal. No respiratory distress. He has no wheezes. He has rhonchi in the right middle field and the left middle field. He has no rales.  There are slight rhonchi audible bilaterally.  Abdominal: Soft. Bowel sounds are normal. He exhibits no distension. There is no tenderness.  Musculoskeletal: Normal range of motion. He exhibits no edema.  Neurological: He is alert and oriented to person, place, and time. Coordination normal.  Skin: Skin is warm and dry. He is not diaphoretic.  Nursing note and vitals reviewed.    ED Treatments / Results  Labs (all labs ordered are listed, but only abnormal results  are displayed) Labs Reviewed  COMPREHENSIVE METABOLIC PANEL  CBC WITH DIFFERENTIAL/PLATELET  BRAIN NATRIURETIC PEPTIDE  I-STAT TROPONIN, ED    EKG None  Radiology No results found.  Procedures Procedures (including critical care time)  Medications Ordered in ED Medications  ipratropium-albuterol (DUONEB) 0.5-2.5 (3) MG/3ML nebulizer solution 3 mL (has no administration in time range)     Initial Impression / Assessment and Plan / ED Course  I have reviewed the triage vital  signs and the nursing notes.  Pertinent labs & imaging results that were available during my care of the patient were reviewed by me and considered in my medical decision making (see chart for details).  Patient presents here with a one-week history of dyspnea.  He reports orthopnea and dyspnea on exertion.  His work-up today reveals a BNP of 2700, troponin of 0.09, and findings of congestive heart failure/pulmonary edema on his chest x-ray.  He was given IV Lasix and will be admitted to the hospitalist service for further work-up.  He will likely require an echocardiogram and cardiology consultation.  I have spoken with Dr. Sharyon MedicusHijazi who agrees to admit.  Final Clinical Impressions(s) / ED Diagnoses   Final diagnoses:  None    ED Discharge Orders    None       Michael Lyonselo, Michael House, MD 05/03/18 1513

## 2018-05-03 NOTE — ED Triage Notes (Signed)
Pt arrives to today by [facility] called out for sob that started while he was slying flat- pt felt like he was choking. Pt lives alone in a retirement apartment. Pt states his pcp has told him he may need a cpap machine. Pt reports his abd feeling bloated and has been using laxatives- pt states he had 4 BM's yesterday.

## 2018-05-04 ENCOUNTER — Inpatient Hospital Stay (HOSPITAL_COMMUNITY): Payer: Medicare HMO

## 2018-05-04 DIAGNOSIS — I340 Nonrheumatic mitral (valve) insufficiency: Secondary | ICD-10-CM

## 2018-05-04 DIAGNOSIS — I248 Other forms of acute ischemic heart disease: Secondary | ICD-10-CM

## 2018-05-04 DIAGNOSIS — I5021 Acute systolic (congestive) heart failure: Secondary | ICD-10-CM

## 2018-05-04 LAB — GLUCOSE, CAPILLARY
GLUCOSE-CAPILLARY: 99 mg/dL (ref 70–99)
GLUCOSE-CAPILLARY: 118 mg/dL — AB (ref 70–99)
Glucose-Capillary: 116 mg/dL — ABNORMAL HIGH (ref 70–99)

## 2018-05-04 LAB — CBC
HCT: 39 % (ref 39.0–52.0)
HEMOGLOBIN: 13 g/dL (ref 13.0–17.0)
MCH: 30.4 pg (ref 26.0–34.0)
MCHC: 33.3 g/dL (ref 30.0–36.0)
MCV: 91.3 fL (ref 80.0–100.0)
PLATELETS: 279 10*3/uL (ref 150–400)
RBC: 4.27 MIL/uL (ref 4.22–5.81)
RDW: 13.3 % (ref 11.5–15.5)
WBC: 7.4 10*3/uL (ref 4.0–10.5)
nRBC: 0 % (ref 0.0–0.2)

## 2018-05-04 LAB — HEMOGLOBIN A1C
HEMOGLOBIN A1C: 7.1 % — AB (ref 4.8–5.6)
Mean Plasma Glucose: 157.07 mg/dL

## 2018-05-04 LAB — LIPID PANEL
CHOL/HDL RATIO: 4.2 ratio
Cholesterol: 129 mg/dL (ref 0–200)
HDL: 31 mg/dL — ABNORMAL LOW (ref >40)
LDL CALC: 87 mg/dL (ref 0–99)
Triglycerides: 57 mg/dL (ref <150)
VLDL: 11 mg/dL (ref 0–40)

## 2018-05-04 LAB — HIV ANTIBODY (ROUTINE TESTING W REFLEX): HIV SCREEN 4TH GENERATION: NONREACTIVE

## 2018-05-04 LAB — BASIC METABOLIC PANEL
ANION GAP: 12 (ref 5–15)
BUN: 9 mg/dL (ref 8–23)
CALCIUM: 8.7 mg/dL — AB (ref 8.9–10.3)
CO2: 27 mmol/L (ref 22–32)
Chloride: 98 mmol/L (ref 98–111)
Creatinine, Ser: 1.04 mg/dL (ref 0.61–1.24)
GFR calc non Af Amer: >60 mL/min (ref >60)
GLUCOSE: 127 mg/dL — AB (ref 70–99)
POTASSIUM: 3.8 mmol/L (ref 3.5–5.1)
SODIUM: 137 mmol/L (ref 135–145)

## 2018-05-04 LAB — ECHOCARDIOGRAM COMPLETE
Height: 70 in
WEIGHTICAEL: 2928 [oz_av]

## 2018-05-04 LAB — HEPARIN LEVEL (UNFRACTIONATED)
Heparin Unfractionated: 0.16 IU/mL — ABNORMAL LOW (ref 0.30–0.70)
Heparin Unfractionated: 0.25 IU/mL — ABNORMAL LOW (ref 0.30–0.70)

## 2018-05-04 LAB — TROPONIN I: TROPONIN I: 0.07 ng/mL — AB (ref <0.03)

## 2018-05-04 MED ORDER — SODIUM CHLORIDE 0.9% FLUSH
3.0000 mL | Freq: Two times a day (BID) | INTRAVENOUS | Status: DC
  Administered 2018-05-04 – 2018-05-05 (×2): 3 mL via INTRAVENOUS

## 2018-05-04 MED ORDER — ASPIRIN 81 MG PO CHEW
81.0000 mg | CHEWABLE_TABLET | ORAL | Status: AC
  Administered 2018-05-05: 81 mg via ORAL
  Filled 2018-05-04: qty 1

## 2018-05-04 MED ORDER — SODIUM CHLORIDE 0.9 % IV SOLN: 250.0000 mL | INTRAVENOUS | Status: DC | PRN

## 2018-05-04 MED ORDER — SODIUM CHLORIDE 0.9% FLUSH: 3.0000 mL | INTRAVENOUS | Status: DC | PRN

## 2018-05-04 MED ORDER — HEPARIN BOLUS VIA INFUSION
2000.0000 [IU] | Freq: Once | INTRAVENOUS | Status: AC
  Administered 2018-05-04: 2000 [IU] via INTRAVENOUS
  Filled 2018-05-04: qty 2000

## 2018-05-04 MED ORDER — SODIUM CHLORIDE 0.9 % IV SOLN
INTRAVENOUS | Status: DC
  Administered 2018-05-05: 05:00:00 via INTRAVENOUS

## 2018-05-04 NOTE — Progress Notes (Signed)
  Echocardiogram 2D Echocardiogram has been performed.  Celene SkeenVijay  Junette Bernat 05/04/2018, 12:18 PM

## 2018-05-04 NOTE — Progress Notes (Signed)
ANTICOAGULATION CONSULT NOTE  Pharmacy Consult for Heparin Indication: chest pain/ACS  No Known Allergies  Patient Measurements: Height: 5\' 10"  (177.8 cm) Weight: 183 lb (83 kg)(Scale B) IBW/kg (Calculated) : 73 Heparin Dosing Weight: 79.4   Vital Signs: Temp: 97.7 F (36.5 C) (11/13 0023) Temp Source: Oral (11/13 0023) BP: 110/73 (11/13 0510) Pulse Rate: 92 (11/13 0510)  Labs: Recent Labs    05/03/18 1324 05/03/18 1706 05/03/18 2217 05/04/18 0117 05/04/18 0433 05/04/18 0826  HGB 14.1  --   --   --  13.0  --   HCT 45.3  --   --   --  39.0  --   PLT 363  --   --   --  279  --   HEPARINUNFRC  --   --   --  0.16*  --  0.25*  CREATININE 1.12  --   --   --   --   --   TROPONINI  --  0.05* 0.09*  --  0.07*  --     Estimated Creatinine Clearance: 68.8 mL/min (by C-G formula based on SCr of 1.12 mg/dL).   Medical History: History reviewed. No pertinent past medical history.  Assessment: Patient is [age] yoM presenting with shortness of breath and left arm pain with elevated troponin but trend was flat. Pharmacy consulted for heparin dosing. No anticoagulation PTA.  Heparin level is below goal at 0.25 on 1250 units/hr. No bleeding noted, CBC is stable. Coronary CTA pending.   Goal of Therapy:  Heparin level 0.3-0.7 units/ml Monitor platelets by anticoagulation protocol: Yes   Plan:  Increase heparin drip to 1400 units/hr 6 hr heparin level Daily heparin level and CBC Monitor for s/sx of bleeding   Loura BackJennifer [place], PharmD, BCPS Clinical Pharmacist Clinical phone for 05/04/2018 until 3p is [phone] 05/04/2018 9:35 AM  **Pharmacist phone directory can now be found on [email or web address] listed under [facility]**

## 2018-05-04 NOTE — Progress Notes (Signed)
Heparin gtt infusing pt lying in bed, cardiology paged regarding NPO and cath status.

## 2018-05-04 NOTE — Progress Notes (Signed)
Patient's Troponin is 0.09. Vitals are stable and MD has been notified. Will continue to monitor.

## 2018-05-04 NOTE — Progress Notes (Signed)
  Was notified by Dr. Delton SeeNelson that based on preliminary read on echo, pt has severe LV dysfunction with EF ~20% and RWMA. He will need R/LHC. I have notified pt of echo results and indication for cath.    I have reviewed the risks, indications, and alternatives to cardiac catheterization and possible angioplasty/stenting with the patient. Risks include but are not limited to bleeding, infection, vascular injury, stroke, myocardial infection, arrhythmia, kidney injury, radiation-related injury in the case of prolonged fluoroscopy use, emergency cardiac surgery, and death. The patient understands the risks of serious complication is low (<1%).   He is scheduled for 05/05/18 with Dr. Herbie BaltimoreHarding ~9:00AM. Will make NPO at midnight. Pre cath orders placed.   Michael Banks , PA-C

## 2018-05-04 NOTE — Progress Notes (Addendum)
Progress Note  Patient Name: Michael Banks Date of Encounter: 05/04/2018  Primary Cardiologist: Tobias AlexanderKatarina Shaun Runyon, MD   Subjective   Feeling much better. Breathing improved. Finally able to lay flat in bed w/o orthopnea and PND. Denies CP.   Inpatient Medications    Scheduled Meds: . atorvastatin  80 mg Oral q1800  . furosemide  40 mg Intravenous Q6H  . [START ON 05/06/2018] Influenza vac split quadrivalent PF  0.5 mL Intramuscular Tomorrow-1000  . insulin aspart  0-5 Units Subcutaneous QHS  . insulin aspart  0-9 Units Subcutaneous TID WC  . sodium chloride flush  3 mL Intravenous Q12H   Continuous Infusions: . sodium chloride    . heparin 1,250 Units/hr (05/04/18 0223)   PRN Meds: sodium chloride, acetaminophen **OR** acetaminophen, ondansetron **OR** ondansetron (ZOFRAN) IV, sodium chloride flush, zolpidem   Vital Signs    Vitals:   05/03/18 1930 05/03/18 2002 05/04/18 0023 05/04/18 0510  BP: 102/83 92/70 101/75 110/73  Pulse: 99 (!) 105 96 92  Resp:  20 16   Temp:  98.6 F (37 C) 97.7 F (36.5 C)   TempSrc:  Oral Oral   SpO2: 97% (!) 82% 98% 92%  Weight:  83 kg    Height:  5\' 10"  (1.778 m)      Intake/Output Summary (Last 24 hours) at 05/04/2018 0940 Last data filed at 05/04/2018 0715 Gross per 24 hour  Intake 319.07 ml  Output 2600 ml  Net -2280.93 ml   Filed Weights   05/03/18 1853 05/03/18 2002  Weight: 79.4 kg 83 kg    Telemetry    NSR - Personally Reviewed  ECG    SR, possible LAE - Personally Reviewed  Physical Exam  GEN: No acute distress.   Neck: No JVD Cardiac: RRR, no murmurs, rubs, or gallops.  Respiratory: Clear to auscultation bilaterally. GI: Soft, nontender, non-distended  MS: No edema; No deformity. Neuro:  Nonfocal  Psych: Normal affect   Labs    Chemistry Recent Labs  Lab 05/03/18 1324  NA 131*  K 4.2  CL 97*  CO2 21*  GLUCOSE 176*  BUN 9  CREATININE 1.12  CALCIUM 9.1  PROT 6.8  ALBUMIN 3.7  AST 34    ALT 35  ALKPHOS 45  BILITOT 1.9*  GFRNONAA >60  GFRAA >60  ANIONGAP 13     Hematology Recent Labs  Lab 05/03/18 1324 05/04/18 0433  WBC 8.1 7.4  RBC 4.80 4.27  HGB 14.1 13.0  HCT 45.3 39.0  MCV 94.4 91.3  MCH 29.4 30.4  MCHC 31.1 33.3  RDW 13.4 13.3  PLT 363 279    Cardiac Enzymes Recent Labs  Lab 05/03/18 1706 05/03/18 2217 05/04/18 0433  TROPONINI 0.05* 0.09* 0.07*    Recent Labs  Lab 05/03/18 1328  TROPIPOC 0.09*     BNP Recent Labs  Lab 05/03/18 1324  BNP 2,703.3*     DDimer No results for input(s): DDIMER in the last 168 hours.   Radiology    Dg Chest 2 View  Result Date: 05/03/2018 CLINICAL DATA:  Cough and shortness of breath EXAM: CHEST - 2 VIEW COMPARISON:  None. FINDINGS: There are small pleural effusions bilaterally. There is atelectatic change in the left base. There is cardiomegaly with mild pulmonary venous hypertension. There is underlying interstitial edema. No frank consolidation. No adenopathy evident. There is degenerative change in the thoracic spine. IMPRESSION: Pulmonary vascular congestion. Small pleural effusions bilaterally with interstitial edema. There may be a degree  of underlying congestive heart failure. No frank consolidation, although there is atelectatic change in the left base. Electronically Signed   By: Bretta Bang III M.D.   On: 05/03/2018 13:51    Cardiac Studies   2D echo pending   Patient Profile     Michael Banks is a 67 y.o. male with a history of hypertension, hyperlipidemia, FH of premature CAD in brother and tobacco abuse, who is being seen today for the evaluation of shortness of breath at the request of Dr. Waymon Amato, Internal Medicine. After initial w/u, he has been diagnosed with acute CHF and found to have mildly elevated troponin.   Assessment & Plan   1. Acute CHF: recent history includes progressive dyspnea, orthopnea and PND x 3 weeks. ED w/u notable for elevated BNP at 2,703 and CXR also  c/w acute CHF with pulmonary vascular congestion with small pleural effusions bilaterally and interstitial edema. 2D echo has been ordered to assess LVEF. He has been started on IV Lasix for diuresis. Great urinary response thus far w/ 2L out yesterday. Breathing significantly improved. No PND or orthopnea last night. We will need to monitor renal function and electrolytes. Will order BMP today and will follow daily. Continue strict I/Os and daily weights. Low sodium diet. We will continue diuresis w/ IV Lasix. BP at this time is too soft for ACE/ARB. Once BP allows, will initiate ARB vs Entrestro depending on LVEF. Will later add BB, once euvolemic, if BP and HR allows.   2. Elevated Troponin: Flat trend, 0.05>>0.09>>0.07. This may be demand ischemia in the setting of acute CHF. However given cardiac risk factors, we will plan ischemic w/u. We will wait until echo is done to assess LVEF and wall motion. If reduced EF and or wall motion abnormalities, he will need LHC. If EF and wall motion normal, will plan coronary CTA. Can d/c IV heparin for now.   3. Lipids: only notable finding on lipid panel is low HDL at 31 mg/dL. LDL is well controlled at 87 and TG 57.   4. DM: better controlled. His Hgb A1c 8 years ago was 15.2. Hgb A1c now is 7.1. Continue management per PCP.   For questions or updates, please contact [facility] Please consult [email or web address] for contact info under     Signed, Robbie Lis, PA-C  05/04/2018, 9:40 AM    The patient was seen, examined and discussed with Brittainy M. Sharol Harness, PA-C and I agree with the above.   Patient symptoms have significantly improved overnight, he has diuresed 2.3 L since yesterday, his lower extremity edema has resolved, he only has residual crackles in the right lung base, his troponin is minimally elevated with flat trend and most probably can be attributed to demand ischemia associated with CHF.  At this point we will await echo cardiogram  results and only proceed with ischemic work-up if abnormal LVEF or regional wall motion abnormalities.  His creatinine is stable and normal, he has normal LDL and triglycerides.  Given low blood pressure I would prefer low dose ACE inhibitor or ARB rather than Entresto, and low-dose beta-blocker, I will decide about management based on echo results.  I will continue IV Lasix for another day, anticipated discharge tomorrow.  Tobias Alexander, MD 05/04/2018

## 2018-05-04 NOTE — Progress Notes (Signed)
PROGRESS NOTE   Michael Banks  ZOX:[id number]    DOB: 1950/08/01    DOA: 05/03/2018  PCP: Patient, No Pcp Per   I have briefly reviewed patients [facility]  Brief Narrative:  67 year old male with PMH of HTN, HLD, tobacco abuse, no known CAD, presented to ED on 05/03/2018 with 3 to 4 weeks history of DOE, orthopnea, PND, generalized fatigue and weakness and abdominal bloating.  He reports couple of episodes of left arm pain with exertion about 4 weeks ago but no recurrence since.  Strong family history of early cardiac death in his brother.  Admitted for acute systolic CHF, elevated troponin and new diagnosed cardiomyopathy.  Cardiology on board and plan cardiac catheter 11/14.   Assessment & Plan:   Active Problems:   Pulmonary edema   Demand ischemia (HCC)   Acute systolic CHF: 3 to 4 weeks of progressive symptoms as noted above.  BNP 07/29/2001 and chest x-ray consistent with pulmonary edema.  Cardiology was consulted.  Started on IV Lasix 40 mg every 6 hourly.  -3.7 L since admission.  ACEI/ARB not started due to soft blood pressures and beta-blockers not started due to acute CHF.  TTE 11/13: LVEF 20-25% with severe diffuse hypokinesis.  Clinically improved.  Cardiomyopathy: Cardiology on board and plan cardiac cath in a.m. to look for ischemic etiology.  ACEI/ARB to be started as blood pressure allows post-cath.  Beta-blocker she had to be resumed.  Elevated troponin: Flat trend.  TTE results as above.  Cardiac cath planned for tomorrow.  Essential hypertension: Has been off of medications PTA.  Soft blood pressures while being diuresed now.  Hyperlipidemia: Continue statins.  Type II DM: A1c 7.1.  SSI here.  Diet controlled at home.   DVT prophylaxis: SCDs Code Status: Full Family Communication: None at bedside Disposition: DC home pending clinical improvement.   Consultants:  Cardiology  Procedures:  None  Antimicrobials:   None   Subjective: Had chest pain.  No recurrence of left upper extremity pain since approximately 4 weeks.  Dyspnea improved and able to lie flat.  Leg edema resolved.  Denies any other complaints.  ROS: As above, otherwise negative.  Objective:  Vitals:   05/03/18 2002 05/04/18 0023 05/04/18 0510 05/04/18 1255  BP: 92/70 101/75 110/73 105/81  Pulse: (!) 105 96 92 90  Resp: 20 16  20   Temp: 98.6 F (37 C) 97.7 F (36.5 C)  97.6 F (36.4 C)  TempSrc: Oral Oral  Oral  SpO2: (!) 82% 98% 92% 100%  Weight: 83 kg     Height: 5\' 10"  (1.778 m)       Examination:  General exam: Pleasant young male, moderately built and nourished lying comfortably supine in bed. Respiratory system: Occasional basal crackles but otherwise clear to auscultation. Respiratory effort normal. Cardiovascular system: S1 & S2 heard, RRR. No JVD, murmurs, rubs, gallops or clicks. No pedal edema.  Telemetry personally reviewed: Sinus rhythm. Gastrointestinal system: Abdomen is nondistended, soft and nontender. No organomegaly or masses felt. Normal bowel sounds heard. Central nervous system: Alert and oriented. No focal neurological deficits. Extremities: Symmetric 5 x 5 power. Skin: No rashes, lesions or ulcers Psychiatry: Judgement and insight appear normal. Mood & affect appropriate.     Data Reviewed: I have personally reviewed following labs and imaging studies  CBC: Recent Labs  Lab 05/03/18 1324 05/04/18 0433  WBC 8.1 7.4  NEUTROABS 5.5  --   HGB 14.1 13.0  HCT  45.3 39.0  MCV 94.4 91.3  PLT 363 279   Basic Metabolic Panel: Recent Labs  Lab 05/03/18 1324 05/04/18 1114  NA 131* 137  K 4.2 3.8  CL 97* 98  CO2 21* 27  GLUCOSE 176* 127*  BUN 9 9  CREATININE 1.12 1.04  CALCIUM 9.1 8.7*   Liver Function Tests: Recent Labs  Lab 05/03/18 1324  AST 34  ALT 35  ALKPHOS 45  BILITOT 1.9*  PROT 6.8  ALBUMIN 3.7   Coagulation Profile: No results for input(s): INR, PROTIME in the  last 168 hours. Cardiac Enzymes: Recent Labs  Lab 05/03/18 1706 05/03/18 2217 05/04/18 0433  TROPONINI 0.05* 0.09* 0.07*   HbA1C: Recent Labs    05/04/18 0117  HGBA1C 7.1*   CBG: Recent Labs  Lab 05/03/18 1750 05/03/18 2209 05/04/18 0731 05/04/18 1252 05/04/18 1602  GLUCAP 144* 123* 116* 99 118*    No results found for this or any previous visit (from the past 240 hour(s)).       Radiology Studies: Dg Chest 2 View  Result Date: 05/03/2018 CLINICAL DATA:  Cough and shortness of breath EXAM: CHEST - 2 VIEW COMPARISON:  None. FINDINGS: There are small pleural effusions bilaterally. There is atelectatic change in the left base. There is cardiomegaly with mild pulmonary venous hypertension. There is underlying interstitial edema. No frank consolidation. No adenopathy evident. There is degenerative change in the thoracic spine. IMPRESSION: Pulmonary vascular congestion. Small pleural effusions bilaterally with interstitial edema. There may be a degree of underlying congestive heart failure. No frank consolidation, although there is atelectatic change in the left base. Electronically Signed   By: Bretta Bang III M.D.   On: 05/03/2018 13:51        Scheduled Meds: . atorvastatin  80 mg Oral q1800  . furosemide  40 mg Intravenous Q6H  . [START ON 05/06/2018] Influenza vac split quadrivalent PF  0.5 mL Intramuscular Tomorrow-1000  . insulin aspart  0-5 Units Subcutaneous QHS  . insulin aspart  0-9 Units Subcutaneous TID WC  . sodium chloride flush  3 mL Intravenous Q12H   Continuous Infusions: . sodium chloride       LOS: 1 day     Marcellus Scott, MD, FACP, [facility]. [facility] Pager [phone]  If 7PM-7AM, please contact night-coverage [email or web address] Password [email or web address] 05/04/2018, 7:25 PM

## 2018-05-04 NOTE — H&P (View-Only) (Signed)
 Progress Note  Patient Name: Michael Banks Date of Encounter: 05/04/2018  Primary Cardiologist: Crespin Forstrom, MD   Subjective   Feeling much better. Breathing improved. Finally able to lay flat in bed w/o orthopnea and PND. Denies CP.   Inpatient Medications    Scheduled Meds: . atorvastatin  80 mg Oral q1800  . furosemide  40 mg Intravenous Q6H  . [START ON 05/06/2018] Influenza vac split quadrivalent PF  0.5 mL Intramuscular Tomorrow-1000  . insulin aspart  0-5 Units Subcutaneous QHS  . insulin aspart  0-9 Units Subcutaneous TID WC  . sodium chloride flush  3 mL Intravenous Q12H   Continuous Infusions: . sodium chloride    . heparin 1,250 Units/hr (05/04/18 0223)   PRN Meds: sodium chloride, acetaminophen **OR** acetaminophen, ondansetron **OR** ondansetron (ZOFRAN) IV, sodium chloride flush, zolpidem   Vital Signs    Vitals:   05/03/18 1930 05/03/18 2002 05/04/18 0023 05/04/18 0510  BP: 102/83 92/70 101/75 110/73  Pulse: 99 (!) 105 96 92  Resp:  20 16   Temp:  98.6 F (37 C) 97.7 F (36.5 C)   TempSrc:  Oral Oral   SpO2: 97% (!) 82% 98% 92%  Weight:  83 kg    Height:  5' 10" (1.778 m)      Intake/Output Summary (Last 24 hours) at 05/04/2018 0940 Last data filed at 05/04/2018 0715 Gross per 24 hour  Intake 319.07 ml  Output 2600 ml  Net -2280.93 ml   Filed Weights   05/03/18 1853 05/03/18 2002  Weight: 79.4 kg 83 kg    Telemetry    NSR - Personally Reviewed  ECG    SR, possible LAE - Personally Reviewed  Physical Exam  GEN: No acute distress.   Neck: No JVD Cardiac: RRR, no murmurs, rubs, or gallops.  Respiratory: Clear to auscultation bilaterally. GI: Soft, nontender, non-distended  MS: No edema; No deformity. Neuro:  Nonfocal  Psych: Normal affect   Labs    Chemistry Recent Labs  Lab 05/03/18 1324  NA 131*  K 4.2  CL 97*  CO2 21*  GLUCOSE 176*  BUN 9  CREATININE 1.12  CALCIUM 9.1  PROT 6.8  ALBUMIN 3.7  AST 34    ALT 35  ALKPHOS 45  BILITOT 1.9*  GFRNONAA >60  GFRAA >60  ANIONGAP 13     Hematology Recent Labs  Lab 05/03/18 1324 05/04/18 0433  WBC 8.1 7.4  RBC 4.80 4.27  HGB 14.1 13.0  HCT 45.3 39.0  MCV 94.4 91.3  MCH 29.4 30.4  MCHC 31.1 33.3  RDW 13.4 13.3  PLT 363 279    Cardiac Enzymes Recent Labs  Lab 05/03/18 1706 05/03/18 2217 05/04/18 0433  TROPONINI 0.05* 0.09* 0.07*    Recent Labs  Lab 05/03/18 1328  TROPIPOC 0.09*     BNP Recent Labs  Lab 05/03/18 1324  BNP 2,703.3*     DDimer No results for input(s): DDIMER in the last 168 hours.   Radiology    Dg Chest 2 View  Result Date: 05/03/2018 CLINICAL DATA:  Cough and shortness of breath EXAM: CHEST - 2 VIEW COMPARISON:  None. FINDINGS: There are small pleural effusions bilaterally. There is atelectatic change in the left base. There is cardiomegaly with mild pulmonary venous hypertension. There is underlying interstitial edema. No frank consolidation. No adenopathy evident. There is degenerative change in the thoracic spine. IMPRESSION: Pulmonary vascular congestion. Small pleural effusions bilaterally with interstitial edema. There may be a degree   of underlying congestive heart failure. No frank consolidation, although there is atelectatic change in the left base. Electronically Signed   By: William  Woodruff III M.D.   On: 05/03/2018 13:51    Cardiac Studies   2D echo pending   Patient Profile     Michael Banks is a 66 y.o. male with a history of hypertension, hyperlipidemia, FH of premature CAD in brother and tobacco abuse, who is being seen today for the evaluation of shortness of breath at the request of Dr. Hongalgi, Internal Medicine. After initial w/u, he has been diagnosed with acute CHF and found to have mildly elevated troponin.   Assessment & Plan   1. Acute CHF: recent history includes progressive dyspnea, orthopnea and PND x 3 weeks. ED w/u notable for elevated BNP at 2,703 and CXR also  c/w acute CHF with pulmonary vascular congestion with small pleural effusions bilaterally and interstitial edema. 2D echo has been ordered to assess LVEF. He has been started on IV Lasix for diuresis. Great urinary response thus far w/ 2L out yesterday. Breathing significantly improved. No PND or orthopnea last night. We will need to monitor renal function and electrolytes. Will order BMP today and will follow daily. Continue strict I/Os and daily weights. Low sodium diet. We will continue diuresis w/ IV Lasix. BP at this time is too soft for ACE/ARB. Once BP allows, will initiate ARB vs Entrestro depending on LVEF. Will later add BB, once euvolemic, if BP and HR allows.   2. Elevated Troponin: Flat trend, 0.05>>0.09>>0.07. This may be demand ischemia in the setting of acute CHF. However given cardiac risk factors, we will plan ischemic w/u. We will wait until echo is done to assess LVEF and wall motion. If reduced EF and or wall motion abnormalities, he will need LHC. If EF and wall motion normal, will plan coronary CTA. Can d/c IV heparin for now.   3. Lipids: only notable finding on lipid panel is low HDL at 31 mg/dL. LDL is well controlled at 87 and TG 57.   4. DM: better controlled. His Hgb A1c 8 years ago was 15.2. Hgb A1c now is 7.1. Continue management per PCP.   For questions or updates, please contact [facility] Please consult [email or web address] for contact info under     Signed, Brittainy Simmons, PA-C  05/04/2018, 9:40 AM    The patient was seen, examined and discussed with Brittainy M. Simmons, PA-C and I agree with the above.   Patient symptoms have significantly improved overnight, he has diuresed 2.3 L since yesterday, his lower extremity edema has resolved, he only has residual crackles in the right lung base, his troponin is minimally elevated with flat trend and most probably can be attributed to demand ischemia associated with CHF.  At this point we will await echo cardiogram  results and only proceed with ischemic work-up if abnormal LVEF or regional wall motion abnormalities.  His creatinine is stable and normal, he has normal LDL and triglycerides.  Given low blood pressure I would prefer low dose ACE inhibitor or ARB rather than Entresto, and low-dose beta-blocker, I will decide about management based on echo results.  I will continue IV Lasix for another day, anticipated discharge tomorrow.  Annalicia Renfrew, MD 05/04/2018   

## 2018-05-04 NOTE — Progress Notes (Signed)
ANTICOAGULATION CONSULT NOTE   Pharmacy Consult for Heparin Indication: chest pain/ACS  No Known Allergies  Patient Measurements: Height: 5\' 10"  (177.8 cm) Weight: 183 lb (83 kg)(Scale B) IBW/kg (Calculated) : 73 Heparin Dosing Weight: 79.4   Vital Signs: Temp: 97.7 F (36.5 C) (11/13 0023) Temp Source: Oral (11/13 0023) BP: 101/75 (11/13 0023) Pulse Rate: 96 (11/13 0023)  Labs: Recent Labs    05/03/18 1324 05/03/18 1706 05/03/18 2217 05/04/18 0117  HGB 14.1  --   --   --   HCT 45.3  --   --   --   PLT 363  --   --   --   HEPARINUNFRC  --   --   --  0.16*  CREATININE 1.12  --   --   --   TROPONINI  --  0.05* 0.09*  --     Estimated Creatinine Clearance: 68.8 mL/min (by C-G formula based on SCr of 1.12 mg/dL).   Medical History: History reviewed. No pertinent past medical history.  Assessment: Patient is [age] yoM presenting with shortness of breath and left arm pain with elevated troponins. Pharmacy consulted for heparin dosing. No anticoagulation PTA. CBC wnl.  Initial heparin level 0.16 units/ml  Goal of Therapy:  Heparin level 0.3-0.7 units/ml Monitor platelets by anticoagulation protocol: Yes   Plan:  Heparin bolus 2000 units and increase heparin drip to 1250 units/hr Check heparin level in 6-8 hours  Monitor s/sx bleed  Thanks for allowing pharmacy to be a part of this patient's care.  Michael Banks, PharmD Clinical Pharmacist

## 2018-05-05 ENCOUNTER — Other Ambulatory Visit: Payer: Self-pay

## 2018-05-05 ENCOUNTER — Encounter (HOSPITAL_COMMUNITY): Payer: Self-pay | Admitting: Cardiology

## 2018-05-05 ENCOUNTER — Encounter (HOSPITAL_COMMUNITY)
Admission: EM | Disposition: A | Discharge status: Home Health | Payer: Self-pay | Source: Home / Self Care | Attending: Thoracic Surgery (Cardiothoracic Vascular Surgery)

## 2018-05-05 ENCOUNTER — Inpatient Hospital Stay (HOSPITAL_COMMUNITY): Payer: Medicare HMO

## 2018-05-05 DIAGNOSIS — I509 Heart failure, unspecified: Secondary | ICD-10-CM

## 2018-05-05 DIAGNOSIS — I2510 Atherosclerotic heart disease of native coronary artery without angina pectoris: Secondary | ICD-10-CM

## 2018-05-05 DIAGNOSIS — I10 Essential (primary) hypertension: Secondary | ICD-10-CM

## 2018-05-05 DIAGNOSIS — I420 Dilated cardiomyopathy: Secondary | ICD-10-CM

## 2018-05-05 DIAGNOSIS — I25110 Atherosclerotic heart disease of native coronary artery with unstable angina pectoris: Secondary | ICD-10-CM

## 2018-05-05 DIAGNOSIS — I340 Nonrheumatic mitral (valve) insufficiency: Secondary | ICD-10-CM

## 2018-05-05 DIAGNOSIS — E119 Type 2 diabetes mellitus without complications: Secondary | ICD-10-CM

## 2018-05-05 DIAGNOSIS — I25119 Atherosclerotic heart disease of native coronary artery with unspecified angina pectoris: Secondary | ICD-10-CM

## 2018-05-05 DIAGNOSIS — I255 Ischemic cardiomyopathy: Secondary | ICD-10-CM

## 2018-05-05 HISTORY — PX: RIGHT/LEFT HEART CATH AND CORONARY ANGIOGRAPHY: CATH118266

## 2018-05-05 HISTORY — PX: ULTRASOUND GUIDANCE FOR VASCULAR ACCESS: SHX6516

## 2018-05-05 LAB — POCT I-STAT 3, VENOUS BLOOD GAS (G3P V)
ACID-BASE EXCESS: 3 mmol/L — AB (ref 0.0–2.0)
Acid-Base Excess: 3 mmol/L — ABNORMAL HIGH (ref 0.0–2.0)
BICARBONATE: 27 mmol/L (ref 20.0–28.0)
Bicarbonate: 26.6 mmol/L (ref 20.0–28.0)
O2 SAT: 71 %
O2 SAT: 68 %
PH VEN: 7.459 — AB (ref 7.250–7.430)
TCO2: 28 mmol/L (ref 22–32)
TCO2: 28 mmol/L (ref 22–32)
pCO2, Ven: 38.1 mmHg — ABNORMAL LOW (ref 44.0–60.0)
pCO2, Ven: 38.1 mmHg — ABNORMAL LOW (ref 44.0–60.0)
pH, Ven: 7.452 — ABNORMAL HIGH (ref 7.250–7.430)
pO2, Ven: 36 mmHg (ref 32.0–45.0)
pO2, Ven: 33 mmHg (ref 32.0–45.0)

## 2018-05-05 LAB — PULMONARY FUNCTION TEST
FEF 25-75 Pre: 1.1 L/s
FEF2575-%Pred-Pre: 40 %
FEV1-%Pred-Pre: 61 %
FEV1-Pre: 2.05 L
FEV1FVC-%Pred-Pre: 88 %
FEV6-%Pred-Pre: 73 %
FEV6-Pre: 3.11 L
FEV6FVC-%Pred-Pre: 105 %
FVC-%Pred-Pre: 69 %
FVC-Pre: 3.11 L
Pre FEV1/FVC ratio: 66 %
Pre FEV6/FVC Ratio: 100 %
RV % pred: 85 %
RV: 1.95 L
TLC % pred: 72 %
TLC: 4.92 L

## 2018-05-05 LAB — GLUCOSE, CAPILLARY
GLUCOSE-CAPILLARY: 122 mg/dL — AB (ref 70–99)
Glucose-Capillary: 109 mg/dL — ABNORMAL HIGH (ref 70–99)
Glucose-Capillary: 116 mg/dL — ABNORMAL HIGH (ref 70–99)
Glucose-Capillary: 143 mg/dL — ABNORMAL HIGH (ref 70–99)
Glucose-Capillary: 155 mg/dL — ABNORMAL HIGH (ref 70–99)

## 2018-05-05 LAB — POCT I-STAT 3, ART BLOOD GAS (G3+)
Acid-Base Excess: 2 mmol/L (ref 0.0–2.0)
Bicarbonate: 25.9 mmol/L (ref 20.0–28.0)
O2 SAT: 96 %
PO2 ART: 77 mmHg — AB (ref 83.0–108.0)
TCO2: 27 mmol/L (ref 22–32)
pCO2 arterial: 36.4 mmHg (ref 32.0–48.0)
pH, Arterial: 7.459 — ABNORMAL HIGH (ref 7.350–7.450)

## 2018-05-05 LAB — BASIC METABOLIC PANEL
ANION GAP: 14 (ref 5–15)
BUN: 8 mg/dL (ref 8–23)
CALCIUM: 8.4 mg/dL — AB (ref 8.9–10.3)
CO2: 26 mmol/L (ref 22–32)
Chloride: 96 mmol/L — ABNORMAL LOW (ref 98–111)
Creatinine, Ser: 1.13 mg/dL (ref 0.61–1.24)
GFR calc Af Amer: >60 mL/min (ref >60)
GLUCOSE: 116 mg/dL — AB (ref 70–99)
POTASSIUM: 3.3 mmol/L — AB (ref 3.5–5.1)
SODIUM: 136 mmol/L (ref 135–145)

## 2018-05-05 LAB — CBC
HCT: 44.8 % (ref 39.0–52.0)
Hemoglobin: 14.6 g/dL (ref 13.0–17.0)
MCH: 29.7 pg (ref 26.0–34.0)
MCHC: 32.6 g/dL (ref 30.0–36.0)
MCV: 91.1 fL (ref 80.0–100.0)
NRBC: 0 % (ref 0.0–0.2)
PLATELETS: 306 10*3/uL (ref 150–400)
RBC: 4.92 MIL/uL (ref 4.22–5.81)
RDW: 13.3 % (ref 11.5–15.5)
WBC: 6.6 10*3/uL (ref 4.0–10.5)

## 2018-05-05 SURGERY — ULTRASOUND GUIDANCE, FOR VASCULAR ACCESS

## 2018-05-05 MED ORDER — HEPARIN (PORCINE) 25000 UT/250ML-% IV SOLN
1350.0000 [IU]/h | INTRAVENOUS | Status: DC
  Administered 2018-05-05 – 2018-05-07 (×2): 1400 [IU]/h via INTRAVENOUS
  Administered 2018-05-08 – 2018-05-09 (×3): 1250 [IU]/h via INTRAVENOUS
  Administered 2018-05-10 – 2018-05-12 (×4): 1350 [IU]/h via INTRAVENOUS
  Filled 2018-05-05 (×10): qty 250

## 2018-05-05 MED ORDER — POTASSIUM CHLORIDE CRYS ER 20 MEQ PO TBCR
60.0000 meq | EXTENDED_RELEASE_TABLET | Freq: Once | ORAL | Status: AC
  Administered 2018-05-05: 60 meq via ORAL
  Filled 2018-05-05: qty 3

## 2018-05-05 MED ORDER — DIGOXIN 125 MCG PO TABS
0.1250 mg | ORAL_TABLET | Freq: Every day | ORAL | Status: DC
  Administered 2018-05-05 – 2018-05-12 (×8): 0.125 mg via ORAL
  Filled 2018-05-05 (×8): qty 1

## 2018-05-05 MED ORDER — SODIUM CHLORIDE 0.9 % IV SOLN
INTRAVENOUS | Status: AC
  Administered 2018-05-05: 11:00:00 via INTRAVENOUS

## 2018-05-05 MED ORDER — HEPARIN (PORCINE) IN NACL 1000-0.9 UT/500ML-% IV SOLN
INTRAVENOUS | Status: AC
  Filled 2018-05-05: qty 1000

## 2018-05-05 MED ORDER — FUROSEMIDE 40 MG PO TABS
40.0000 mg | ORAL_TABLET | Freq: Every day | ORAL | Status: DC
  Administered 2018-05-06 – 2018-05-12 (×7): 40 mg via ORAL
  Filled 2018-05-05 (×8): qty 1

## 2018-05-05 MED ORDER — VERAPAMIL HCL 2.5 MG/ML IV SOLN
INTRAVENOUS | Status: AC
  Filled 2018-05-05: qty 2

## 2018-05-05 MED ORDER — POTASSIUM CHLORIDE CRYS ER 20 MEQ PO TBCR
40.0000 meq | EXTENDED_RELEASE_TABLET | Freq: Once | ORAL | Status: AC
  Administered 2018-05-05: 40 meq via ORAL
  Filled 2018-05-05: qty 2

## 2018-05-05 MED ORDER — LIDOCAINE HCL (PF) 1 % IJ SOLN
INTRAMUSCULAR | Status: AC
  Filled 2018-05-05: qty 30

## 2018-05-05 MED ORDER — SODIUM CHLORIDE 0.9% FLUSH: 3.0000 mL | INTRAVENOUS | Status: DC | PRN

## 2018-05-05 MED ORDER — SODIUM CHLORIDE 0.9% FLUSH
3.0000 mL | Freq: Two times a day (BID) | INTRAVENOUS | Status: DC
  Administered 2018-05-05 – 2018-05-12 (×9): 3 mL via INTRAVENOUS

## 2018-05-05 MED ORDER — LIDOCAINE HCL (PF) 1 % IJ SOLN
INTRAMUSCULAR | Status: DC | PRN
  Administered 2018-05-05: 2 mL

## 2018-05-05 MED ORDER — SODIUM CHLORIDE 0.9 % IV SOLN: 250.0000 mL | INTRAVENOUS | Status: DC | PRN

## 2018-05-05 MED ORDER — HEPARIN (PORCINE) IN NACL 1000-0.9 UT/500ML-% IV SOLN
INTRAVENOUS | Status: DC | PRN
  Administered 2018-05-05 (×2): 500 mL

## 2018-05-05 MED ORDER — IOHEXOL 350 MG/ML SOLN
INTRAVENOUS | Status: DC | PRN
  Administered 2018-05-05: 110 mL via INTRAVENOUS

## 2018-05-05 MED ORDER — HEPARIN SODIUM (PORCINE) 1000 UNIT/ML IJ SOLN
INTRAMUSCULAR | Status: DC | PRN
  Administered 2018-05-05: 4500 [IU] via INTRAVENOUS

## 2018-05-05 MED ORDER — VERAPAMIL HCL 2.5 MG/ML IV SOLN
INTRAVENOUS | Status: DC | PRN
  Administered 2018-05-05: 10 mL via INTRA_ARTERIAL

## 2018-05-05 SURGICAL SUPPLY — 13 items
CATH BALLN WEDGE 5F 110CM (CATHETERS) ×3 IMPLANT
CATH INFINITI 5FR AL1 (CATHETERS) ×3 IMPLANT
CATH OPTITORQUE TIG 4.0 5F (CATHETERS) ×3 IMPLANT
DEVICE RAD COMP TR BAND LRG (VASCULAR PRODUCTS) ×3 IMPLANT
GLIDESHEATH SLEND SS 6F .021 (SHEATH) ×3 IMPLANT
GUIDEWIRE INQWIRE 1.5J.035X260 (WIRE) ×2 IMPLANT
INQWIRE 1.5J .035X260CM (WIRE) ×3
KIT HEART LEFT (KITS) ×3 IMPLANT
PACK CARDIAC CATHETERIZATION (CUSTOM PROCEDURE TRAY) ×3 IMPLANT
SHEATH GLIDE SLENDER 4/5FR (SHEATH) ×3 IMPLANT
SHEATH PROBE COVER 6X72 (BAG) ×3 IMPLANT
TRANSDUCER W/STOPCOCK (MISCELLANEOUS) ×3 IMPLANT
TUBING CIL FLEX 10 FLL-RA (TUBING) ×3 IMPLANT

## 2018-05-05 NOTE — Progress Notes (Addendum)
Progress Note  Patient Name: Michael Banks Date of Encounter: 05/05/2018  Primary Cardiologist: Tobias AlexanderKatarina Cordaro Mukai, MD   Subjective   Feeling much better. Breathing improved. Finally able to lay flat in bed w/o orthopnea and PND. Denies CP.   Inpatient Medications    Scheduled Meds: . atorvastatin  80 mg Oral q1800  . furosemide  40 mg Intravenous Q6H  . [START ON 05/06/2018] Influenza vac split quadrivalent PF  0.5 mL Intramuscular Tomorrow-1000  . insulin aspart  0-5 Units Subcutaneous QHS  . insulin aspart  0-9 Units Subcutaneous TID WC  . sodium chloride flush  3 mL Intravenous Q12H  . sodium chloride flush  3 mL Intravenous Q12H   Continuous Infusions: . sodium chloride    . sodium chloride 75 mL/hr at 05/05/18 1052  . sodium chloride    . heparin     PRN Meds: sodium chloride, sodium chloride, acetaminophen **OR** acetaminophen, ondansetron **OR** ondansetron (ZOFRAN) IV, sodium chloride flush, sodium chloride flush, zolpidem   Vital Signs    Vitals:   05/05/18 0947 05/05/18 0952 05/05/18 0957 05/05/18 1113  BP: 102/75 107/77 102/75 101/71  Pulse: 100 (!) 102 (!) 103 87  Resp: 14 11 12 18   Temp:    98.5 F (36.9 C)  TempSrc:    Oral  SpO2: 94% 97% 95% 96%  Weight:      Height:        Intake/Output Summary (Last 24 hours) at 05/05/2018 1208 Last data filed at 05/05/2018 1053 Gross per 24 hour  Intake 252.9 ml  Output 3200 ml  Net -2947.1 ml   Filed Weights   05/03/18 1853 05/03/18 2002 05/05/18 0511  Weight: 79.4 kg 83 kg 76.2 kg    Telemetry    NSR - Personally Reviewed  ECG    SR, possible LAE - Personally Reviewed  Physical Exam  GEN: No acute distress.   Neck: No JVD Cardiac: RRR, no murmurs, rubs, or gallops.  Respiratory: Clear to auscultation bilaterally. GI: Soft, nontender, non-distended  MS: No edema; No deformity. Neuro:  Nonfocal  Psych: Normal affect   Labs    Chemistry Recent Labs  Lab 05/03/18 1324 05/04/18 1114  05/05/18 0314  NA 131* 137 136  K 4.2 3.8 3.3*  CL 97* 98 96*  CO2 21* 27 26  GLUCOSE 176* 127* 116*  BUN 9 9 8   CREATININE 1.12 1.04 1.13  CALCIUM 9.1 8.7* 8.4*  PROT 6.8  --   --   ALBUMIN 3.7  --   --   AST 34  --   --   ALT 35  --   --   ALKPHOS 45  --   --   BILITOT 1.9*  --   --   GFRNONAA >60 >60 >60  GFRAA >60 >60 >60  ANIONGAP 13 12 14      Hematology Recent Labs  Lab 05/03/18 1324 05/04/18 0433 05/05/18 0314  WBC 8.1 7.4 6.6  RBC 4.80 4.27 4.92  HGB 14.1 13.0 14.6  HCT 45.3 39.0 44.8  MCV 94.4 91.3 91.1  MCH 29.4 30.4 29.7  MCHC 31.1 33.3 32.6  RDW 13.4 13.3 13.3  PLT 363 279 306    Cardiac Enzymes Recent Labs  Lab 05/03/18 1706 05/03/18 2217 05/04/18 0433  TROPONINI 0.05* 0.09* 0.07*    Recent Labs  Lab 05/03/18 1328  TROPIPOC 0.09*     BNP Recent Labs  Lab 05/03/18 1324  BNP 2,703.3*     DDimer No  results for input(s): DDIMER in the last 168 hours.   Radiology    Dg Chest 2 View  Result Date: 05/03/2018 CLINICAL DATA:  Cough and shortness of breath EXAM: CHEST - 2 VIEW COMPARISON:  None. FINDINGS: There are small pleural effusions bilaterally. There is atelectatic change in the left base. There is cardiomegaly with mild pulmonary venous hypertension. There is underlying interstitial edema. No frank consolidation. No adenopathy evident. There is degenerative change in the thoracic spine. IMPRESSION: Pulmonary vascular congestion. Small pleural effusions bilaterally with interstitial edema. There may be a degree of underlying congestive heart failure. No frank consolidation, although there is atelectatic change in the left base. Electronically Signed   By: Bretta Bang III M.D.   On: 05/03/2018 13:51    Cardiac Studies   2D echo pending   Patient Profile     Michael Banks is a 67 y.o. male with a history of hypertension, hyperlipidemia, FH of premature CAD in brother and tobacco abuse, who is being seen today for the  evaluation of shortness of breath at the request of Dr. Waymon Amato, Internal Medicine. After initial w/u, he has been diagnosed with acute CHF and found to have mildly elevated troponin.   Assessment & Plan   1. 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 Ischemic cardiomyopathy 3. NSTEMI 4. Severe MR 5. Hyperlipidemia 6. DM  Cath showed 3 vessel disease and severe MR, CT surgery consult was called.  He has diuresed 5.2 L in 36 hours, lower extremity edema has resolved, he has residual small pleural effusion on the right, otherwise clear lungs.  I will decrease lasix to 40 mg PO daily. Continue aspirin, atorvastatin, digoxin 0.125 mg po daily. Crea normal, K 3.3, I will replace.  Tobias Alexander, MD 05/05/2018

## 2018-05-05 NOTE — Progress Notes (Signed)
Pt's  BP is low  and he is asymptomatic, the MD on call (cardiologist) has been notified about the low BP. He recommends that we continue to monitor the patient and notify him again if his condition changes.

## 2018-05-05 NOTE — Consult Note (Addendum)
[address]       [place] [id number]             [phone]        [name] E Kady [facility] Medical Record #[id number] Date of Birth: June 07, 1951  Referring: No ref. provider found Primary Care: Patient, No Pcp Per Primary Cardiologist:Katarina Delton SeeNelson, MD  Chief Complaint:    Chief Complaint  Patient presents with  . Shortness of Breath    History of Present Illness:      Michael Banks is a 67 year old male with past medical history significant for hypertension, hyperlipidemia, Type 2 Diabetes Mellitus, tobacco abuse, dilated cardiomyopathy, and mitral valve insufficiency who presented with 1 week of increasing shortness of breath but he did not have any chest pain.  The patient has no history of coronary artery disease or congestive heart failure but reports that around 1 week ago he started having left arm pain and numbness with exercise which was relieved with rest. He stated he had a bout of food poisoning the week before and was consisently vomiting.  He was hospitalized at that time.  He then woke up during the night feeling like he was choking, therefore he reported to the emergency department.  In the emergency room he was found to have pulmonary edema and his BNP was 2703.  Peak troponin was 0.09.  Cardiology was consulted for assistance.  Cardiac catheterization was performed on 05/05/2018 which showed severe multivessel coronary artery disease including diffuse proximal RCA disease with 90% stenosis and 100% at the mid RCA.  Proximal LAD lesion has 99% stenosis and proximal circumflex lesion has 95% stenosis.  There is 90% stenosis of the second OM branch of the circumflex.  There is severe (4+) mitral regurgitation.  Echocardiogram performed on 05/04/2018 showed an estimated left ejection fraction of 20 to 25%.  There was severe diffuse hypokinesis.  He is currently chest pain-free.  We are consulted for possible surgical revascularization and/or  mitral valve repair /replacement.    Current Activity/ Functional Status: Patient was independent with mobility/ambulation, transfers, ADL's, IADL's.   Zubrod Score: At the time of surgery this patient's most appropriate activity status/level should be described as: []     0    Normal activity, no symptoms [x]     1    Restricted in physical strenuous activity but ambulatory, able to do out light work []     2    Ambulatory and capable of self care, unable to do work activities, up and about                 more than 50%  Of the time                            []     3    Only limited self care, in bed greater than 50% of waking hours []     4    Completely disabled, no self care, confined to bed or chair []     5    Moribund  History reviewed. No pertinent past medical history.  Past Surgical History:  Procedure Laterality Date  . KNEE ARTHROSCOPY    . NOSE SURGERY    . RIGHT/LEFT HEART CATH AND CORONARY ANGIOGRAPHY N/A 05/05/2018   Procedure: RIGHT/LEFT HEART CATH AND CORONARY ANGIOGRAPHY;  Surgeon: Marykay LexHarding, David W, MD;  [facility];  Service: Cardiovascular;  Laterality: N/A;  .  ULTRASOUND GUIDANCE FOR VASCULAR ACCESS  05/05/2018   Procedure: Ultrasound Guidance For Vascular Access;  Surgeon: Marykay Lex, MD;  Location: [facility] INVASIVE CV LAB;  Service: Cardiovascular;;    Social History   Tobacco Use  Smoking Status Former Smoker  Smokeless Tobacco Never Used    Social History   Substance and Sexual Activity  Alcohol Use Never  . Frequency: Never     No Known Allergies  Current Facility-Administered Medications  Medication Dose Route Frequency Provider Last Rate Last Dose  . 0.9 %  sodium chloride infusion  250 mL Intravenous PRN Marykay Lex, MD      . 0.9 %  sodium chloride infusion   Intravenous Continuous Marykay Lex, MD 75 mL/hr at 05/05/18 1052    . 0.9 %  sodium chloride infusion  250 mL Intravenous PRN Marykay Lex, MD      .  acetaminophen (TYLENOL) tablet 650 mg  650 mg Oral Q6H PRN Marykay Lex, MD       Or  . acetaminophen (TYLENOL) suppository 650 mg  650 mg Rectal Q6H PRN Marykay Lex, MD      . atorvastatin (LIPITOR) tablet 80 mg  80 mg Oral q1800 Marykay Lex, MD   80 mg at 05/04/18 1930  . digoxin (LANOXIN) tablet 0.125 mg  0.125 mg Oral Daily Lars Masson, MD   0.125 mg at 05/05/18 1320  . [START ON 05/06/2018] furosemide (LASIX) tablet 40 mg  40 mg Oral Daily Tobias Alexander H, MD      . heparin ADULT infusion 100 units/mL (25000 units/237mL sodium chloride 0.45%)  1,400 Units/hr Intravenous Continuous Silvana Newness, RPH      . [START ON 05/06/2018] Influenza vac split quadrivalent PF (FLUARIX) injection 0.5 mL  0.5 mL Intramuscular Tomorrow-1000 Marykay Lex, MD      . insulin aspart (novoLOG) injection 0-5 Units  0-5 Units Subcutaneous QHS Marykay Lex, MD      . insulin aspart (novoLOG) injection 0-9 Units  0-9 Units Subcutaneous TID WC Marykay Lex, MD   1 Units at 05/03/18 1830  . ondansetron (ZOFRAN) tablet 4 mg  4 mg Oral Q6H PRN Marykay Lex, MD       Or  . ondansetron [facility]) injection 4 mg  4 mg Intravenous Q6H PRN Marykay Lex, MD      . sodium chloride flush (NS) 0.9 % injection 3 mL  3 mL Intravenous Q12H Marykay Lex, MD   3 mL at 05/05/18 0806  . sodium chloride flush (NS) 0.9 % injection 3 mL  3 mL Intravenous PRN Marykay Lex, MD      . sodium chloride flush (NS) 0.9 % injection 3 mL  3 mL Intravenous Q12H Marykay Lex, MD   3 mL at 05/05/18 1320  . sodium chloride flush (NS) 0.9 % injection 3 mL  3 mL Intravenous PRN Marykay Lex, MD      . zolpidem [facility]) tablet 5 mg  5 mg Oral QHS PRN,MR X 1 Marykay Lex, MD        Medications Prior to Admission  Medication Sig Dispense Refill Last Dose  . DM-Doxylamine-Acetaminophen (NYQUIL COLD & FLU PO) Take 15 mLs by mouth as needed (cold).   05/02/2018 at prn  . ondansetron (ZOFRAN  ODT) 4 MG disintegrating tablet Take 1 tablet (4 mg total) by mouth every 8 (eight) hours as needed for nausea or vomiting.  10 tablet 0 Past Month at prn    Family History  Problem Relation Age of Onset  . CVA Father        died of "cerebral hemorrhage"  . CAD Brother        died of "heart attack" at age 61     Review of Systems:   Review of Systems  Constitutional: Positive for malaise/fatigue and weight loss. Negative for chills and fever.  Respiratory: Positive for shortness of breath. Negative for cough and sputum production.   Cardiovascular: Positive for leg swelling. Negative for chest pain and palpitations.  Gastrointestinal: Positive for nausea and vomiting.  Musculoskeletal: Positive for myalgias (left arm pain).   Pertinent items are noted in HPI.    Physical Exam: BP 101/71 (BP Location: Left Arm)   Pulse 87   Temp 98.5 F (36.9 C) (Oral)   Resp 18   Ht 5\' 10"  (1.778 m)   Wt 76.2 kg   SpO2 96%   BMI 24.09 kg/m    General appearance: alert, cooperative and no distress Resp: clear to auscultation bilaterally Cardio: regular rate and rhythm, S1, S2 normal, no murmur, click, rub or gallop GI: soft, non-tender; bowel sounds normal; no masses,  no organomegaly Extremities: extremities normal, atraumatic, no cyanosis or edema Neurologic: Grossly normal     Recent Radiology Findings:   No results found.   I have independently reviewed the above radiologic studies and discussed with the patient   Recent Lab Findings: Lab Results  Component Value Date   WBC 6.6 05/05/2018   HGB 14.6 05/05/2018   HCT 44.8 05/05/2018   PLT 306 05/05/2018   GLUCOSE 116 (H) 05/05/2018   CHOL 129 05/04/2018   TRIG 57 05/04/2018   HDL 31 (L) 05/04/2018   LDLCALC 87 05/04/2018   ALT 35 05/03/2018   AST 34 05/03/2018   NA 136 05/05/2018   K 3.3 (L) 05/05/2018   CL 96 (L) 05/05/2018   CREATININE 1.13 05/05/2018   BUN 8 05/05/2018   CO2 26 05/05/2018   HGBA1C 7.1 (H)  05/04/2018      Assessment / Plan:      1. CAD-multivessel disease on cardiac cath.  Pain medication PRN. Possible revascularization with Dr. Dorris Fetch next week.  2. Ischemic Cardiomyopathy- EF is 20-25% with severe hypokinesis on Echocardiogram. Continue to maximize medical therapy. Continue ASA, statin, dig, and lasix.  3. Hyperlipidemia-continue statin therapy 4. Hypertension-BP too soft for BB or ACEI at this time.  5. Diabetes Mellitus Type 2-continue insulin coverage. Last CBGs are 109/122/116. Hemoglobin A1C is 15.2 on 3/21, recently 7.1 on 11/13 6. Tobacco abuse-quit earlier this month.    Plan: Reviewed the procedure of coronary artery bypass grafting in detail. He may also need intervention of his mitral valve given he has severe regurgitation. All questions answered to the patient's satisfaction. Possible CABG +/- MVR early next week with Dr. Dorris Fetch. Continue pre-op testing.    I  spent 30 minutes counseling the patient face to face.   Jari Favre, PA-C  05/05/2018 2:16 PM   Patient seen and examined. 67 yo man with a history of hypertension, hyperlipidemia and tobacco abuse. Presented with Class IV CHF. Workup revealed 3 vessel CAD with severe ischemic cardiomyopathy and 4+ mitral regurgitation.  Would probably benefit from CABG + mitral repair/ replacement. Would be a high risk operation given severity of LV dysfunction.  Needs a viability study- MR or nuclear- will defer to Cardiology   Will follow up  next week to make final decision re: surgery and timing  Continue medical treatment of CHF  Viviann Spare C. Dorris Fetch, MD [facility] [phone]

## 2018-05-05 NOTE — Interval H&P Note (Signed)
History and Physical Interval Note:  05/05/2018 9:01 AM  Zoila ShutterPhilip E Riepe  has presented today for surgery, with the diagnosis of Cardiomyopathy.  Was notified by Dr. Delton SeeNelson that based on preliminary read on echo, pt has severe LV dysfunction with EF ~20% and RWMA. He will need R/LHC. I have notified pt of echo results and indication for cath.    I have reviewed the risks, indications, and alternatives to cardiac catheterization and possible angioplasty/stenting with the patient. Risks include but are not limited to bleeding, infection, vascular injury, stroke, myocardial infection, arrhythmia, kidney injury, radiation-related injury in the case of prolonged fluoroscopy use, emergency cardiac surgery, and death. The patient understands the risks of serious complication is low (<1%).   He is scheduled for 05/05/18 with Dr. Herbie Baltimore ~9:00AM. Will make NPO at midnight. Pre cath orders placed.   Robbie LisBrittainy Simmons , PA-C  The various methods of treatment have been discussed with the patient and family. After consideration of risks, benefits and other options for treatment, the patient has consented to  Procedure(s): LEFT HEART CATH AND CORONARY ANGIOGRAPHY (N/A), RIGHT HEART CATHETERIZATION with possible PERCUTANEOUS CORONARY INTERVENTION as a surgical intervention .  The patient's history has been reviewed, patient examined, no change in status, stable for surgery.  I have reviewed the patient's chart and labs.  Questions were answered to the patient's satisfaction.    Cath Lab Visit (complete for each Cath Lab visit)  Clinical Evaluation Leading to the Procedure:   ACS: No.  Non-ACS:    Anginal Classification: CCS III - NYHA CHF Class III  Anti-ischemic medical therapy: Minimal Therapy (1 class of medications)  Non-Invasive Test Results: High-risk stress test findings: cardiac mortality >3%/year - severely reduced LVEF.  Prior CABG: No previous CABG   Bryan Lemmaavid 

## 2018-05-05 NOTE — Progress Notes (Addendum)
PROGRESS NOTE   Michael Banks  ZOX:[id number]    DOB: [birth year]    DOA: 05/03/2018  PCP: Patient, No Pcp Per   Brief Narrative:  67 year old male with PMH of HTN, HLD, tobacco abuse, no known CAD, presented to ED on 05/03/2018 with 3 to 4 weeks history of DOE, orthopnea, PND, generalized fatigue and weakness and abdominal bloating.  He reports couple of episodes of left arm pain with exertion about 4 weeks ago but no recurrence since.  Strong family history of early cardiac death in his brother.  Admitted for acute systolic CHF, elevated troponin and new diagnosed cardiomyopathy.  Cardiology on board and plan cardiac catheter 11/14.  05/05/2018: Cardiac catheterization revealed multi-vessel disease.  Cardiothoracic team is been consulted.  Will await cardiothoracic team input.  No chest pain.  No shortness of breath.   Assessment & Plan:   Active Problems:   Pulmonary edema   Demand ischemia (HCC)   New onset of congestive heart failure (HCC)   Dilated cardiomyopathy (HCC)   Mitral valve insufficiency   Acute systolic CHF: 3 to 4 weeks of progressive symptoms as noted above.  BNP 07/29/2001 and chest x-ray consistent with pulmonary edema.  Cardiology was consulted.  Started on IV Lasix 40 mg every 6 hourly.  -3.7 L since admission.  ACEI/ARB not started due to soft blood pressures and beta-blockers not started due to acute CHF.  TTE 11/13: LVEF 20-25% with severe diffuse hypokinesis.  Clinically improved. 05/05/2018: Kindly see above.  Cardiac cath revealed severe triple-vessel disease.  Cardiothoracic team input is awaited.  Cardiomyopathy: Cardiology on board and plan cardiac cath in a.m. to look for ischemic etiology.  ACEI/ARB to be started as blood pressure allows post-cath.  Beta-blocker she had to be resumed. 05/05/2018: Echo revealed EF of 20 to 25%.  Continue to optimize.  Elevated troponin: Flat trend.  TTE results as above.  Cardiac cath planned for tomorrow.  Essential  hypertension: Has been off of medications PTA.  Soft blood pressures while being diuresed now.  Hyperlipidemia: Continue statins.  Type II DM: A1c 7.1.  SSI here.  Diet controlled at home.   DVT prophylaxis: SCDs Code Status: Full Family Communication: None at bedside Disposition: DC home pending clinical improvement.   Consultants:  Cardiology  Procedures:  None  Antimicrobials:  None   Subjective: No chest pain. No shortness of breath. Awaiting cardiac surgery input.   Objective:  Vitals:   05/05/18 0952 05/05/18 0957 05/05/18 1113 05/05/18 1610  BP: 107/77 102/75 101/71 (!) 89/69  Pulse: (!) 102 (!) 103 87 (!) 102  Resp: 11 12 18 18   Temp:   98.5 F (36.9 C) 98.4 F (36.9 C)  TempSrc:   Oral Oral  SpO2: 97% 95% 96% 100%  Weight:      Height:        Examination:  General exam: Pleasant young male, moderately built and nourished lying comfortably supine in bed. Respiratory system: Occasional basal crackles but otherwise clear to auscultation. Respiratory effort normal. Cardiovascular system: S1 & S2 heard, RRR. No JVD, murmurs, rubs, gallops or clicks. No pedal edema.  Telemetry personally reviewed: Sinus rhythm. Gastrointestinal system: Abdomen is nondistended, soft and nontender. No organomegaly or masses felt. Normal bowel sounds heard. Central nervous system: Alert and oriented. No focal neurological deficits. Extremities: Symmetric 5 x 5 power. Skin: No rashes, lesions or ulcers Psychiatry: Judgement and insight appear normal. Mood & affect appropriate.     Data Reviewed: I have personally  reviewed following labs and imaging studies  CBC: Recent Labs  Lab 05/03/18 1324 05/04/18 0433 05/05/18 0314  WBC 8.1 7.4 6.6  NEUTROABS 5.5  --   --   HGB 14.1 13.0 14.6  HCT 45.3 39.0 44.8  MCV 94.4 91.3 91.1  PLT 363 279 306   Basic Metabolic Panel: Recent Labs  Lab 05/03/18 1324 05/04/18 1114 05/05/18 0314  NA 131* 137 136  K 4.2 3.8 3.3*    CL 97* 98 96*  CO2 21* 27 26  GLUCOSE 176* 127* 116*  BUN 9 9 8   CREATININE 1.12 1.04 1.13  CALCIUM 9.1 8.7* 8.4*   Liver Function Tests: Recent Labs  Lab 05/03/18 1324  AST 34  ALT 35  ALKPHOS 45  BILITOT 1.9*  PROT 6.8  ALBUMIN 3.7   Coagulation Profile: No results for input(s): INR, PROTIME in the last 168 hours. Cardiac Enzymes: Recent Labs  Lab 05/03/18 1706 05/03/18 2217 05/04/18 0433  TROPONINI 0.05* 0.09* 0.07*   HbA1C: Recent Labs    05/04/18 0117  HGBA1C 7.1*   CBG: Recent Labs  Lab 05/04/18 1252 05/04/18 1602 05/04/18 2050 05/05/18 0748 05/05/18 1136  GLUCAP 99 118* 109* 122* 116*    No results found for this or any previous visit (from the past 240 hour(s)).       Radiology Studies: No results found.      Scheduled Meds: . atorvastatin  80 mg Oral q1800  . digoxin  0.125 mg Oral Daily  . [START ON 05/06/2018] furosemide  40 mg Oral Daily  . [START ON 05/06/2018] Influenza vac split quadrivalent PF  0.5 mL Intramuscular Tomorrow-1000  . insulin aspart  0-5 Units Subcutaneous QHS  . insulin aspart  0-9 Units Subcutaneous TID WC  . sodium chloride flush  3 mL Intravenous Q12H  . sodium chloride flush  3 mL Intravenous Q12H   Continuous Infusions: . sodium chloride    . sodium chloride    . heparin       LOS: 2 days     Barnetta Chapel, MD. [facility] Pager [phone].  If 7PM-7AM, please contact night-coverage [email or web address] Password [email or web address] 05/05/2018, 4:14 PM

## 2018-05-06 ENCOUNTER — Inpatient Hospital Stay (HOSPITAL_COMMUNITY): Payer: Medicare HMO

## 2018-05-06 ENCOUNTER — Encounter (HOSPITAL_COMMUNITY): Payer: Medicare HMO

## 2018-05-06 DIAGNOSIS — Z01810 Encounter for preprocedural cardiovascular examination: Secondary | ICD-10-CM

## 2018-05-06 DIAGNOSIS — I509 Heart failure, unspecified: Secondary | ICD-10-CM

## 2018-05-06 DIAGNOSIS — I255 Ischemic cardiomyopathy: Secondary | ICD-10-CM

## 2018-05-06 LAB — CBC
HEMATOCRIT: 43.6 % (ref 39.0–52.0)
Hemoglobin: 14 g/dL (ref 13.0–17.0)
MCH: 29.7 pg (ref 26.0–34.0)
MCHC: 32.1 g/dL (ref 30.0–36.0)
MCV: 92.6 fL (ref 80.0–100.0)
NRBC: 0 % (ref 0.0–0.2)
PLATELETS: 350 10*3/uL (ref 150–400)
RBC: 4.71 MIL/uL (ref 4.22–5.81)
RDW: 13.4 % (ref 11.5–15.5)
WBC: 7.4 10*3/uL (ref 4.0–10.5)

## 2018-05-06 LAB — GLUCOSE, CAPILLARY
GLUCOSE-CAPILLARY: 122 mg/dL — AB (ref 70–99)
GLUCOSE-CAPILLARY: 118 mg/dL — AB (ref 70–99)
Glucose-Capillary: 138 mg/dL — ABNORMAL HIGH (ref 70–99)
Glucose-Capillary: 147 mg/dL — ABNORMAL HIGH (ref 70–99)

## 2018-05-06 LAB — BASIC METABOLIC PANEL
ANION GAP: 10 (ref 5–15)
BUN: 10 mg/dL (ref 8–23)
CALCIUM: 8.8 mg/dL — AB (ref 8.9–10.3)
CO2: 27 mmol/L (ref 22–32)
CREATININE: 1.02 mg/dL (ref 0.61–1.24)
Chloride: 99 mmol/L (ref 98–111)
Glucose, Bld: 141 mg/dL — ABNORMAL HIGH (ref 70–99)
Potassium: 4 mmol/L (ref 3.5–5.1)
SODIUM: 136 mmol/L (ref 135–145)

## 2018-05-06 LAB — HEPARIN LEVEL (UNFRACTIONATED)
Heparin Unfractionated: 0.26 IU/mL — ABNORMAL LOW (ref 0.30–0.70)
Heparin Unfractionated: 0.55 IU/mL (ref 0.30–0.70)

## 2018-05-06 MED ORDER — GADOBUTROL 1 MMOL/ML IV SOLN
12.0000 mL | Freq: Once | INTRAVENOUS | Status: AC | PRN
  Administered 2018-05-06: 12 mL via INTRAVENOUS

## 2018-05-06 NOTE — Progress Notes (Signed)
Writer arrived to place PIV per consult. Patient refused, stated existing PIV is no longer painful and would like a reassessment after shower today. Primary care RN notified. Instructed to contact [facility] if PIV is still needed.

## 2018-05-06 NOTE — Progress Notes (Signed)
Pharmacist Heart Failure Core Measure Documentation  Assessment: Michael Banks has an EF documented as 20-25% on 05/04/2018 by ECHO.  Rationale: Heart failure patients with left ventricular systolic dysfunction (LVSD) and an EF < 40% should be prescribed an angiotensin converting enzyme inhibitor (ACEI) or angiotensin receptor blocker (ARB) at discharge unless a contraindication is documented in the medical record.  This patient is not currently on an ACEI or ARB for HF.  This note is being placed in the record in order to provide documentation that a contraindication to the use of these agents is present for this encounter.  ACE Inhibitor or Angiotensin Receptor Blocker is contraindicated (specify all that apply)  []   ACEI allergy AND ARB allergy []   Angioedema []   Moderate or severe aortic stenosis []   Hyperkalemia [x]   Hypotension []   Renal artery stenosis []   Worsening renal function, preexisting renal disease or dysfunction  Could consider starting if BP improves.  Sherron MondayKimberly Loree Shehata, PharmD Clinical Pharmacist  Pager: [phone] Phone: [phone]  05/06/2018 1:40 PM

## 2018-05-06 NOTE — Plan of Care (Signed)
  Problem: Education: Goal: Knowledge of General Education information will improve Description Including pain rating scale, medication(s)/side effects and non-pharmacologic comfort measures Outcome: Progressing   Problem: Clinical Measurements: Goal: Ability to maintain clinical measurements within normal limits will improve Outcome: Progressing   Problem: Activity: Goal: Risk for activity intolerance will decrease Outcome: Progressing   Problem: Cardiac: Goal: Ability to achieve and maintain adequate cardiopulmonary perfusion will improve Outcome: Progressing

## 2018-05-06 NOTE — Plan of Care (Signed)
  Problem: Clinical Measurements: Goal: Ability to maintain clinical measurements within normal limits will improve 05/06/2018 2207 by Elnita Maxwellodoo, Margaree Sandhu A, RN Outcome: Progressing 05/06/2018 2207 by Elnita Maxwellodoo, King Pinzon A, RN Outcome: Progressing   Problem: Activity: Goal: Risk for activity intolerance will decrease 05/06/2018 2207 by Elnita Maxwellodoo, Jerl Munyan A, RN Outcome: Progressing 05/06/2018 2207 by Elnita Maxwellodoo, Daksha Koone A, RN Outcome: Progressing   Problem: Coping: Goal: Level of anxiety will decrease 05/06/2018 2207 by Elnita Maxwellodoo, Shonte Soderlund A, RN Outcome: Progressing 05/06/2018 2207 by Elnita Maxwellodoo, [place] Retana A, RN Outcome: Progressing   Problem: Skin Integrity: Goal: Risk for impaired skin integrity will decrease 05/06/2018 2207 by Elnita Maxwellodoo, Demarri Elie A, RN Outcome: Progressing 05/06/2018 2207 by Elnita Maxwellodoo, Astrid Vides A, RN Outcome: Progressing

## 2018-05-06 NOTE — Progress Notes (Signed)
Pre-op Cardiac Surgery  Carotid Findings:  Bilateral 1% to 39% ICA stenosis. Vertebral artery flow is antegrade.96  Upper Extremity Right Left  Brachial Pressures 96 Triphasic 95 Triphasic  Radial Waveforms Triphasic Triphasic  Ulnar Waveforms Triphasic Triphasic  Palmar Arch (Allen's Test) Normal Normal   Findings:  Palmar arch evaluation - Doppler waveforms remained normal bilaterally with both radial and ulnar compressions.    Lower  Extremity Right Left  Dorsalis Pedis 104 Triphasic 98 Triphasic  Posterior Tibial 96 Triphasic 98 Triphasic  Ankle/Brachial Indices 1.08 1.02    Findings:  ABIs and Doppler waveforms indicate normal arterial flow bilaterally at rest.  [facility] CenterVirginia Shaunae Sieloff,RVS 05/06/2018 4:36 PM

## 2018-05-06 NOTE — Care Management Important Message (Signed)
Important Message  Patient Details  Name: Michael Banks MRN: [id number] Date of Birth: Mar 27, 1951   Medicare Important Message Given:  Yes    Dorena BodoIris Jamesyn Moorefield 05/06/2018, 3:21 PM

## 2018-05-06 NOTE — Progress Notes (Signed)
ANTICOAGULATION CONSULT NOTE - Follow Up Consult  Pharmacy Consult for heparin Indication: CAD awaiting possible CABG+/-MVR  Labs: Recent Labs    05/03/18 1706 05/03/18 2217 05/04/18 0117 05/04/18 0433 05/04/18 0826 05/04/18 1114 05/05/18 0314 05/05/18 2352  HGB  --   --   --  13.0  --   --  14.6 14.0  HCT  --   --   --  39.0  --   --  44.8 43.6  PLT  --   --   --  279  --   --  306 350  HEPARINUNFRC  --   --  0.16*  --  0.25*  --   --  0.26*  CREATININE  --   --   --   --   --  1.04 1.13 1.02  TROPONINI 0.05* 0.09*  --  0.07*  --   --   --   --     Assessment/Plan:  67yo male slightly subtherapeutic on heparin after resumed post-cath though likely needs more time to accumulate. Will continue gtt at current rate and check additional level.   Vernard GamblesVeronda Kaybree Williams, PharmD, BCPS  05/06/2018,12:53 AM

## 2018-05-06 NOTE — Progress Notes (Addendum)
Progress Note  Patient Name: Michael Banks Date of Encounter: 05/06/2018  Primary Cardiologist: Tobias AlexanderKatarina Vibha Ferdig, MD   Subjective   Doing ok. Breathing improved. No resting dyspnea. Denies CP. Radial access site for cath is stable. No complications.   Inpatient Medications    Scheduled Meds: . atorvastatin  80 mg Oral q1800  . digoxin  0.125 mg Oral Daily  . furosemide  40 mg Oral Daily  . Influenza vac split quadrivalent PF  0.5 mL Intramuscular Tomorrow-1000  . insulin aspart  0-5 Units Subcutaneous QHS  . insulin aspart  0-9 Units Subcutaneous TID WC  . sodium chloride flush  3 mL Intravenous Q12H  . sodium chloride flush  3 mL Intravenous Q12H   Continuous Infusions: . sodium chloride    . sodium chloride    . heparin 1,400 Units/hr (05/06/18 0400)   PRN Meds: sodium chloride, sodium chloride, acetaminophen **OR** acetaminophen, ondansetron **OR** ondansetron (ZOFRAN) IV, sodium chloride flush, sodium chloride flush, zolpidem   Vital Signs    Vitals:   05/05/18 1943 05/06/18 0015 05/06/18 0515 05/06/18 0518  BP: 91/70 90/71 95/70    Pulse: (!) 102 (!) 105 98   Resp: 18 18 18    Temp: 97.6 F (36.4 C) (!) 97.4 F (36.3 C) 97.8 F (36.6 C)   TempSrc: Oral Oral Oral   SpO2:  96% 95%   Weight:    75.7 kg  Height:        Intake/Output Summary (Last 24 hours) at 05/06/2018 1115 Last data filed at 05/06/2018 0900 Gross per 24 hour  Intake 910.53 ml  Output 1000 ml  Net -89.47 ml   Filed Weights   05/03/18 2002 05/05/18 0511 05/06/18 0518  Weight: 83 kg 76.2 kg 75.7 kg    Telemetry    NSR. No ventricular ectopy.  - Personally Reviewed  ECG    Not performed today- Personally Reviewed  Physical Exam   GEN: No acute distress.   Neck: No JVD Cardiac: RRR, 2/6 murmur at Apex Respiratory: Clear to auscultation bilaterally. GI: Soft, nontender, non-distended  MS: No edema; No deformity. Neuro:  Nonfocal  Psych: Normal affect   Labs     Chemistry Recent Labs  Lab 05/03/18 1324 05/04/18 1114 05/05/18 0314 05/05/18 2352  NA 131* 137 136 136  K 4.2 3.8 3.3* 4.0  CL 97* 98 96* 99  CO2 21* 27 26 27   GLUCOSE 176* 127* 116* 141*  BUN 9 9 8 10   CREATININE 1.12 1.04 1.13 1.02  CALCIUM 9.1 8.7* 8.4* 8.8*  PROT 6.8  --   --   --   ALBUMIN 3.7  --   --   --   AST 34  --   --   --   ALT 35  --   --   --   ALKPHOS 45  --   --   --   BILITOT 1.9*  --   --   --   GFRNONAA >60 >60 >60 >60  GFRAA >60 >60 >60 >60  ANIONGAP 13 12 14 10      Hematology Recent Labs  Lab 05/04/18 0433 05/05/18 0314 05/05/18 2352  WBC 7.4 6.6 7.4  RBC 4.27 4.92 4.71  HGB 13.0 14.6 14.0  HCT 39.0 44.8 43.6  MCV 91.3 91.1 92.6  MCH 30.4 29.7 29.7  MCHC 33.3 32.6 32.1  RDW 13.3 13.3 13.4  PLT 279 306 350    Cardiac Enzymes Recent Labs  Lab 05/03/18 1706 05/03/18 2217  05/04/18 0433  TROPONINI 0.05* 0.09* 0.07*    Recent Labs  Lab 05/03/18 1328  TROPIPOC 0.09*     BNP Recent Labs  Lab 05/03/18 1324  BNP 2,703.3*     DDimer No results for input(s): DDIMER in the last 168 hours.   Radiology    No results found.  Cardiac Studies   2D Echo 05/04/18 Study Conclusions  - Left ventricle: The cavity size was moderately dilated. Systolic   function was severely reduced. The estimated ejection fraction   was in the range of 20% to 25%. Indeterminate diastolic function.   Severe diffuse hypokinesis. - Regional wall motion abnormality: Akinesis of the mid   inferolateral, basal-mid anterolateral, and apical lateral   myocardium. - Aortic valve: Transvalvular velocity was within the normal range.   There was no stenosis. There was no regurgitation. - Mitral valve: Mitral valve leaflet tenting. There was moderate   regurgitation. The PISA quantitation may slightly underestimate   severity, with one jet more central, and one jet eccentric and   anteriorly directed (clip 71). - Left atrium: The atrium was mildly  dilated. - Right ventricle: The cavity size was mildly dilated. Wall   thickness was normal. Systolic function was moderately reduced. - Tricuspid valve: There was trivial regurgitation. - Inferior vena cava: The vessel was dilated. The respirophasic   diameter changes were blunted (< 50%), consistent with elevated   central venous pressure. - Pericardium, extracardiac: Pleural effusion.   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  There is severe (4+) mitral regurgitation.  SEVERE MULTIVESSEL DISEASE  Prox RCA to Mid RCA lesion is 90% stenosed. Mid RCA to Dist RCA lesion is 100% stenosed with 100% stenosed side branch in Post Atrio.  Prox LAD lesion is 99% stenosed. Mid LAD-1 lesion is 80% stenosed. Mid LAD-2 lesion is 90% stenosed. Dist LAD lesion is 85% stenosed.  Prox Cx lesion is 95% stenosed. 2nd Mrg lesion is 90% stenosed with 90% stenosed side branch in Lat 2nd Mrg.   SUMMARY  Severe multivessel CAD: Diffuse proximal RCA disease with mid 100% CTO filling with bridging collaterals and right-to-left collaterals, separate ostium circumflex with proximal 95% stenosis and bifurcation 90% stenosis of OM1, 99% mid LAD after very large septal perforator trunk (perfusing PDA) with bridging collaterals filling mid to distal LAD with a large diagonal branch and diffuse disease throughout the distal LAD.  Moderate pulmonary hypertension likely secondary to pulmonary venous congestion and mitral regurgitation.  At least moderate-severe if not severe mitral regurgitation (likely ischemic)  Moderately elevated LVEDP and PCWP consistent with acute diastolic heart failure in conjunction with acute systolic heart failure from  Ischemic Cardiomyopathy   At least mildly reduced cardiac output of 4.8, index 2.39.   RECOMMENDATION  Return to nursing unit for TR band removal.  Continue aggressive CHF management  The patient would likely best benefit from CABG plus possible mitral valve repair. --We will consult CVTS  Will place on IV heparin 8 hours after sheath removal  Consider viability study as part of assessment for revascularization options.  Percutaneous options would be limited and extremely high risk given his low output and multivessel disease.  Would likely need support with Impella  Recommend Aspirin 81mg  daily for severe CAD.     Patient Profile  Michael Whittlesey Cooperis a 67 y.o.malewith a history of hypertension, hyperlipidemia, FH of premature CAD in brother andtobacco abuse, who is being seen today for the evaluation ofshortness of breathat the request of Dr. Waymon Amato, Internal Medicine. After initial w/u, he has been diagnosed with acute CHF and found to have severe LV systolic dysfunction, moderate to severe MR and severe multivessel CAD.   Assessment & Plan   1. Acute CHF: recent history includes progressive dyspnea, orthopnea and PND x 3 weeks. ED w/u notable for elevated BNP at 2,703 and CXR also c/w acute CHF with pulmonary vascular congestion with small pleural effusions bilaterally and interstitial edema. Found to have systolic HF with reduced EF of 20-25%, moderate MR and severe multivessel disease.  -  He was started on IV Lasix for diuresis. Great urinary response. Net I/Os negative 5.3 L since admit. Breathing significantly improved. His RHC yesterday confirmed elevated LVEDP c/w volume overload. We will continue diuretics. Lasix 40 daily. Renal function and electrolytes are stable. Continue strict I/Os and daily weights. Low sodium diet. BP at this time is too soft for ACE/ARB. Once BP allows, will initiate ARB vs Entrestro. Will later add BB, once euvolemic, if BP and HR allows.    2. CAD: LHC yesterday showed severe multivessel disease, as outlined in cath report above. He is CP free. The patient would likely best benefit from CABG plus possible mitral valve repair. He was seen by Dr. Dorris Fetch yesterday who has recommended a viability study (MRI). Final determination regarding CABG will be made next week. For now ,continue high dose statin. No BB due to acute HF and soft BP.   3. 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 BP limiting medical therapy. No room in pressures to add an ARB or Entresto. No room for BB. We will continue to monitor and try to initiate guidelines directed medical therapy for systolic HF if/when possible. After revascularization and period of medical therapy, he will ultimately need a repeat echocardiogram to reassess LVEF and if EF remains <35%, he will need referral to EP for consideration for ICD. I have reviewed tele. He has had no ventricular ectopy. Will continue to monitor.  4. Mitral Regurgitation: moderate to severe. Felt to be ischemic. He will likely need MV replacement vs repair, per CT surgery recommendations. Further surgical w/u pending.   5. HLD HDL low at 31 mg/dL. LDL is elevated at 87 and TG 57. Given multivessel CAD, LDL goal is < 70 mg/dL. He is now on high dose statin therapy with atorvastatin 80 mg. He will need repeat FLP and HFTs in 6-8 weeks.   6. DM: better controlled. His Hgb A1c 8 years ago was 15.2. Hgb A1c now is 7.1. Continue management per PCP.   MD to follow with further recs.   For questions or updates, please contact [facility] Please consult [email or web address] for contact info under     Signed, Robbie Lis, PA-C  05/06/2018, 11:15 AM    The patient was seen, examined and discussed with Brittainy M. Sharol Harness, PA-C and I agree with the above.   S/P cath yesterday with findings of Severe multivessel CAD and severe MR< elevated LVEDP< moderate pulmonary hypertension. CT surgery consulted and planning for a  possible CABG and MV repair. We will order cardiac MRI. He has diuresed 8 L in 3 days with significant improvement, now on PO lasix. Crea is stable.  BP soft. Continue ASA, atorvastatin, digoxin, no BB or ACEI/ARB yet.  Tobias Alexander, MD 05/06/2018

## 2018-05-06 NOTE — Progress Notes (Signed)
PROGRESS NOTE   [name] E Dula  WUJ:[id number]    DOB: 12-26-1950    DOA: 05/03/2018  PCP: Patient, No Pcp Per   Brief Narrative:  67 year old male with PMH of HTN, HLD, tobacco abuse, no known CAD, presented to ED on 05/03/2018 with 3 to 4 weeks history of DOE, orthopnea, PND, generalized fatigue and weakness and abdominal bloating.  He reports couple of episodes of left arm pain with exertion about 4 weeks ago but no recurrence since.  Strong family history of early cardiac death in his brother.  Admitted for acute systolic CHF, elevated troponin and new diagnosed cardiomyopathy.  Cardiology on board and plan cardiac catheter 11/14.  05/05/2018: Cardiac catheterization revealed multi-vessel disease.  Cardiothoracic team is been consulted.  Will await cardiothoracic team input.  No chest pain.  No shortness of breath.  05/06/2018: Patient seen.  Reported vague chest discomfort that woke him up last night.  Work-up is in progress for likely CABG.  Tentatively, CABG is planned for next week.  Remained asymptomatic when seen today.  Cardiothoracic surgery input is appreciated.  Cardiology input is appreciated.  Continue current management.   Assessment & Plan:   Active Problems:   Pulmonary edema   Demand ischemia (HCC)   New onset of congestive heart failure (HCC)   Dilated cardiomyopathy (HCC)   Mitral valve insufficiency   Acute systolic CHF: 3 to 4 weeks of progressive symptoms as noted above.  BNP 07/29/2001 and chest x-ray consistent with pulmonary edema.  Cardiology was consulted.  Started on IV Lasix 40 mg every 6 hourly.  -3.7 L since admission.  ACEI/ARB not started due to soft blood pressures and beta-blockers not started due to acute CHF.  TTE 11/13: LVEF 20-25% with severe diffuse hypokinesis.  Clinically improved. 05/06/2018: Cardiac cath revealed multi-vessel disease.  Cardiothoracic team input is awaited.  Cardiomyopathy: Cardiology on board and plan cardiac cath in a.m. to  look for ischemic etiology.  ACEI/ARB to be started as blood pressure allows post-cath.  Beta-blocker she had to be resumed. 05/05/2018: Echo revealed EF of 20 to 25%.  Continue to optimize.  Elevated troponin: Flat trend.  TTE results as above.  Cardiac cath planned for tomorrow.  Essential hypertension: Has been off of medications PTA.  Soft blood pressures while being diuresed now.  Hyperlipidemia: Continue statins.  Type II DM: A1c 7.1.  SSI here.  Diet controlled at home.   DVT prophylaxis: SCDs Code Status: Full Family Communication: None at bedside Disposition: DC home pending clinical improvement.   Consultants:  Cardiology  Procedures:  None  Antimicrobials:  None   Subjective: No chest pain. No shortness of breath. Awaiting cardiac surgery input. Events of last night noted   Objective:  Vitals:   05/05/18 1943 05/06/18 0015 05/06/18 0515 05/06/18 0518  BP: 91/70 90/71 95/70    Pulse: (!) 102 (!) 105 98   Resp: 18 18 18    Temp: 97.6 F (36.4 C) (!) 97.4 F (36.3 C) 97.8 F (36.6 C)   TempSrc: Oral Oral Oral   SpO2:  96% 95%   Weight:    75.7 kg  Height:        Examination:  General exam: Pleasant young male, moderately built and nourished lying comfortably supine in bed. Respiratory system: Occasional basal crackles but otherwise clear to auscultation. Respiratory effort normal. Cardiovascular system: S1 & S2 heard, RRR. No JVD, murmurs, rubs, gallops or clicks. No pedal edema.  Telemetry personally reviewed: Sinus rhythm. Gastrointestinal system: Abdomen  is nondistended, soft and nontender. No organomegaly or masses felt. Normal bowel sounds heard. Central nervous system: Alert and oriented. No focal neurological deficits. Extremities: Symmetric 5 x 5 power. Skin: No rashes, lesions or ulcers Psychiatry: Judgement and insight appear normal. Mood & affect appropriate.     Data Reviewed: I have personally reviewed following labs and imaging  studies  CBC: Recent Labs  Lab 05/03/18 1324 05/04/18 0433 05/05/18 0314 05/05/18 2352  WBC 8.1 7.4 6.6 7.4  NEUTROABS 5.5  --   --   --   HGB 14.1 13.0 14.6 14.0  HCT 45.3 39.0 44.8 43.6  MCV 94.4 91.3 91.1 92.6  PLT 363 279 306 350   Basic Metabolic Panel: Recent Labs  Lab 05/03/18 1324 05/04/18 1114 05/05/18 0314 05/05/18 2352  NA 131* 137 136 136  K 4.2 3.8 3.3* 4.0  CL 97* 98 96* 99  CO2 21* 27 26 27   GLUCOSE 176* 127* 116* 141*  BUN 9 9 8 10   CREATININE 1.12 1.04 1.13 1.02  CALCIUM 9.1 8.7* 8.4* 8.8*   Liver Function Tests: Recent Labs  Lab 05/03/18 1324  AST 34  ALT 35  ALKPHOS 45  BILITOT 1.9*  PROT 6.8  ALBUMIN 3.7   Coagulation Profile: No results for input(s): INR, PROTIME in the last 168 hours. Cardiac Enzymes: Recent Labs  Lab 05/03/18 1706 05/03/18 2217 05/04/18 0433  TROPONINI 0.05* 0.09* 0.07*   HbA1C: Recent Labs    05/04/18 0117  HGBA1C 7.1*   CBG: Recent Labs  Lab 05/05/18 1136 05/05/18 1654 05/05/18 2122 05/06/18 0707 05/06/18 1205  GLUCAP 116* 143* 155* 122* 138*    No results found for this or any previous visit (from the past 240 hour(s)).       Radiology Studies: No results found.      Scheduled Meds: . atorvastatin  80 mg Oral q1800  . digoxin  0.125 mg Oral Daily  . furosemide  40 mg Oral Daily  . Influenza vac split quadrivalent PF  0.5 mL Intramuscular Tomorrow-1000  . insulin aspart  0-5 Units Subcutaneous QHS  . insulin aspart  0-9 Units Subcutaneous TID WC  . sodium chloride flush  3 mL Intravenous Q12H  . sodium chloride flush  3 mL Intravenous Q12H   Continuous Infusions: . sodium chloride    . sodium chloride    . heparin 1,400 Units/hr (05/06/18 0400)     LOS: 3 days     Barnetta Chapel, MD. [facility] Pager [phone].  If 7PM-7AM, please contact night-coverage [email or web address] Password [email or web address] 05/06/2018, 5:10 PM

## 2018-05-06 NOTE — Progress Notes (Signed)
ANTICOAGULATION CONSULT NOTE - Follow Up Consult  Pharmacy Consult for heparin Indication: CAD awaiting possible CABG+/-MVR  Labs: Recent Labs    05/03/18 1706 05/03/18 2217  05/04/18 0433 05/04/18 0826 05/04/18 1114 05/05/18 0314 05/05/18 2352 05/06/18 0525  HGB  --   --   --  13.0  --   --  14.6 14.0  --   HCT  --   --   --  39.0  --   --  44.8 43.6  --   PLT  --   --   --  279  --   --  306 350  --   HEPARINUNFRC  --   --    < >  --  0.25*  --   --  0.26* 0.55  CREATININE  --   --   --   --   --  1.04 1.13 1.02  --   TROPONINI 0.05* 0.09*  --  0.07*  --   --   --   --   --    < > = values in this interval not displayed.    Assessment/Plan:  Patient is [age] yoM presenting with shortness of breath and left arm pain with elevated troponin but trend was flat. Pharmacy consulted for heparin dosing. No anticoagulation PTA.  Heparin level is therapeutic at 0.55, on 1400 units/hr. Hgb 14, plt 350. No s/sx of bleeding. No infusion issues.   Goal of Therapy:  Heparin level 0.3-0.7 units/ml Monitor platelets by anticoagulation protocol: Yes   Plan:  Continue heparin drip at 1400 units/hr Daily heparin level and CBC Monitor for s/sx of bleeding  Sherron MondayKimberly Lizeth Bencosme, PharmD Clinical Pharmacist  Pager: [phone] Phone: [phone] 05/06/2018,7:57 AM

## 2018-05-07 LAB — CBC
HCT: 43.3 % (ref 39.0–52.0)
HEMOGLOBIN: 13.7 g/dL (ref 13.0–17.0)
MCH: 29.2 pg (ref 26.0–34.0)
MCHC: 31.6 g/dL (ref 30.0–36.0)
MCV: 92.3 fL (ref 80.0–100.0)
Platelets: 328 10*3/uL (ref 150–400)
RBC: 4.69 MIL/uL (ref 4.22–5.81)
RDW: 13.4 % (ref 11.5–15.5)
WBC: 6 10*3/uL (ref 4.0–10.5)
nRBC: 0 % (ref 0.0–0.2)

## 2018-05-07 LAB — BASIC METABOLIC PANEL
ANION GAP: 12 (ref 5–15)
BUN: 8 mg/dL (ref 8–23)
CHLORIDE: 95 mmol/L — AB (ref 98–111)
CO2: 26 mmol/L (ref 22–32)
CREATININE: 1.05 mg/dL (ref 0.61–1.24)
Calcium: 8.9 mg/dL (ref 8.9–10.3)
GFR calc non Af Amer: >60 mL/min (ref >60)
Glucose, Bld: 130 mg/dL — ABNORMAL HIGH (ref 70–99)
Potassium: 3.8 mmol/L (ref 3.5–5.1)
SODIUM: 133 mmol/L — AB (ref 135–145)

## 2018-05-07 LAB — GLUCOSE, CAPILLARY
GLUCOSE-CAPILLARY: 164 mg/dL — AB (ref 70–99)
GLUCOSE-CAPILLARY: 95 mg/dL (ref 70–99)
GLUCOSE-CAPILLARY: 111 mg/dL — AB (ref 70–99)
Glucose-Capillary: 116 mg/dL — ABNORMAL HIGH (ref 70–99)
Glucose-Capillary: 108 mg/dL — ABNORMAL HIGH (ref 70–99)

## 2018-05-07 LAB — HEPARIN LEVEL (UNFRACTIONATED)
Heparin Unfractionated: 0.74 IU/mL — ABNORMAL HIGH (ref 0.30–0.70)
Heparin Unfractionated: 0.71 IU/mL — ABNORMAL HIGH (ref 0.30–0.70)

## 2018-05-07 MED ORDER — SENNOSIDES-DOCUSATE SODIUM 8.6-50 MG PO TABS
2.0000 | ORAL_TABLET | Freq: Once | ORAL | Status: AC
  Administered 2018-05-07: 2 via ORAL
  Filled 2018-05-07: qty 2

## 2018-05-07 NOTE — Progress Notes (Signed)
Hand off report taken by RN Joyce GrossKay and continuity of care started from 3pm, reassement done focusing on Neuro and chest and lungs, no any specific complain this time, IV heparin going on @13 .5cc/hr, will continue to monitor.  Lonia Farberekha, RN

## 2018-05-07 NOTE — Progress Notes (Signed)
ANTICOAGULATION CONSULT NOTE  Pharmacy Consult for Heparin Indication: chest pain/ACS  No Known Allergies  Patient Measurements: Height: 5\' 10"  (177.8 cm) Weight: 169 lb 3.2 oz (76.7 kg)(scale c) IBW/kg (Calculated) : 73 Heparin Dosing Weight: 79.4   Vital Signs: Temp: 97.6 F (36.4 C) (11/16 1147) Temp Source: Oral (11/16 1147) BP: 99/70 (11/16 2019) Pulse Rate: 94 (11/16 2019)  Labs: Recent Labs    05/05/18 0314  05/05/18 2352 05/06/18 0525 05/07/18 0558 05/07/18 2004  HGB 14.6  --  14.0  --  13.7  --   HCT 44.8  --  43.6  --  43.3  --   PLT 306  --  350  --  328  --   HEPARINUNFRC  --    < > 0.26* 0.55 0.74* 0.71*  CREATININE 1.13  --  1.02  --  1.05  --    < > = values in this interval not displayed.    Estimated Creatinine Clearance: 73.4 mL/min (by C-G formula based on SCr of 1.05 mg/dL).   Medical History: History reviewed. No pertinent past medical history.  Assessment: Patient is [age] yoM presenting with shortness of breath and left arm pain with elevated troponin but trend was flat. Pharmacy consulted for heparin dosing. No anticoagulation PTA. Tentative plan is for CABG next week.  Heparin level is slightly supratherapeutic at 0.71 this evening on 1350 units/hr. CBC is stable and within normal limits. Per RN, no bleeding reported and no issues with the infusion.   Goal of Therapy:  Heparin level 0.3-0.7 units/ml Monitor platelets by anticoagulation protocol: Yes   Plan:  Decrease heparin infusion to 1250 units/hr 6 hr heparin level Daily heparin level and CBC while on heparin Monitor for s/sx of bleeding F/u final plans for CABG next week   Cylee Dattilo A. Jeanella CrazePierce, PharmD, BCPS Clinical Pharmacist [place] Pager: [phone] Please utilize Amion for appropriate phone number to reach the unit pharmacist [facility]    05/07/2018   8:57 PM

## 2018-05-07 NOTE — Progress Notes (Signed)
ANTICOAGULATION CONSULT NOTE  Pharmacy Consult for Heparin Indication: chest pain/ACS  No Known Allergies  Patient Measurements: Height: 5\' 10"  (177.8 cm) Weight: 169 lb 3.2 oz (76.7 kg)(scale c) IBW/kg (Calculated) : 73 Heparin Dosing Weight: 79.4   Vital Signs: Temp: 97.6 F (36.4 C) (11/16 1147) Temp Source: Oral (11/16 1147) BP: 99/70 (11/16 1147) Pulse Rate: 98 (11/16 1147)  Labs: Recent Labs    05/05/18 0314 05/05/18 2352 05/06/18 0525 05/07/18 0558  HGB 14.6 14.0  --  13.7  HCT 44.8 43.6  --  43.3  PLT 306 350  --  328  HEPARINUNFRC  --  0.26* 0.55 0.74*  CREATININE 1.13 1.02  --  1.05    Estimated Creatinine Clearance: 73.4 mL/min (by C-G formula based on SCr of 1.05 mg/dL).   Medical History: History reviewed. No pertinent past medical history.  Assessment: Patient is [age] yoM presenting with shortness of breath and left arm pain with elevated troponin but trend was flat. Pharmacy consulted for heparin dosing. No anticoagulation PTA. Tentative plan is for CABG next week.  Heparin level is slightly supratherapeutic at 0.74 this morning on 1400 units/hr. CBC is stable and within normal limits. Per RN, no bleeding reported and no issues with the infusion.   Goal of Therapy:  Heparin level 0.3-0.7 units/ml Monitor platelets by anticoagulation protocol: Yes   Plan:  Decrease heparin infusion to 1350 units/hr 6 hr heparin level Daily heparin level and CBC while on heparin Monitor for s/sx of bleeding F/u final plans for CABG next week   Harlow MaresAmy Ajahnae Rathgeber, PharmD PGY1 Pharmacy Resident Phone [phone]  05/07/2018   12:43 PM

## 2018-05-07 NOTE — Progress Notes (Signed)
PROGRESS NOTE   Michael Banks  ZOX:[id number]    DOB: July 31, 1950    DOA: 05/03/2018  PCP: Patient, No Pcp Per   Brief Narrative:  67 year old male with PMH of HTN, HLD, tobacco abuse, no known CAD, presented to ED on 05/03/2018 with 3 to 4 weeks history of DOE, orthopnea, PND, generalized fatigue and weakness and abdominal bloating.  He reports couple of episodes of left arm pain with exertion about 4 weeks ago but no recurrence since.  Strong family history of early cardiac death in his brother.  Admitted for acute systolic CHF, elevated troponin and new diagnosed cardiomyopathy.  Cardiology on board and plan cardiac catheter 11/14.  05/05/2018: Cardiac catheterization revealed multi-vessel disease.  Cardiothoracic team is been consulted.  Will await cardiothoracic team input.  No chest pain.  No shortness of breath.  05/06/2018: Patient seen.  Reported vague chest discomfort that woke him up last night.  Work-up is in progress for likely CABG.  Tentatively, CABG is planned for next week.  Remained asymptomatic when seen today.  Cardiothoracic surgery input is appreciated.  Cardiology input is appreciated.  Continue current management.  05/07/2018: Patient seen alongside patient's brother.  No new complaints.  No chest pain, no shortness of breath.  Patient feels stable today.  Hopefully, patient will undergo CABG early next week.   Assessment & Plan:   Active Problems:   Pulmonary edema   Demand ischemia (HCC)   New onset of congestive heart failure (HCC)   Dilated cardiomyopathy (HCC)   Mitral valve insufficiency   Acute systolic CHF: 3 to 4 weeks of progressive symptoms as noted above.  BNP 07/29/2001 and chest x-ray consistent with pulmonary edema.  Cardiology was consulted.  Started on IV Lasix 40 mg every 6 hourly.  -3.7 L since admission.  ACEI/ARB not started due to soft blood pressures and beta-blockers not started due to acute CHF.  TTE 11/13: LVEF 20-25% with severe diffuse  hypokinesis.  Clinically improved. 05/06/2018: Cardiac cath revealed multi-vessel disease.  Cardiothoracic team input is awaited. 05/07/2018: Patient remains stable.  Cardiology and cardiothoracic team said directing care.  Cardiomyopathy: Cardiology on board and plan cardiac cath in a.m. to look for ischemic etiology.  ACEI/ARB to be started as blood pressure allows post-cath.  Beta-blocker she had to be resumed. 05/05/2018: Echo revealed EF of 20 to 25%.  Continue to optimize.  Elevated troponin: Flat trend.  TTE results as above.  Cardiac cath planned for tomorrow.  Essential hypertension: Has been off of medications PTA.  Soft blood pressures while being diuresed now.  Hyperlipidemia: Continue statins.  Type II DM: A1c 7.1.  SSI here.  Diet controlled at home.   DVT prophylaxis: SCDs Code Status: Full Family Communication: None at bedside Disposition: DC home pending clinical improvement.   Consultants:  Cardiology  Procedures:  None  Antimicrobials:  None   Subjective: No chest pain. No shortness of breath. Awaiting cardiac surgery input. Events of last night noted   Objective:  Vitals:   05/06/18 1130 05/06/18 1932 05/07/18 0346 05/07/18 1147  BP: 110/67 94/69 91/66  99/70  Pulse: 95 100 90 98  Resp: 18 18 18 18   Temp: 98 F (36.7 C) (!) 97.4 F (36.3 C) 97.8 F (36.6 C) 97.6 F (36.4 C)  TempSrc: Oral Oral Oral Oral  SpO2: 97% 96% 98% 93%  Weight:   76.7 kg   Height:        Examination:  General exam: Pleasant young male, moderately built  and nourished lying comfortably supine in bed. Respiratory system: Occasional basal crackles but otherwise clear to auscultation. Respiratory effort normal. Cardiovascular system: S1 & S2 heard, RRR. No JVD, murmurs, rubs, gallops or clicks. No pedal edema.  Telemetry personally reviewed: Sinus rhythm. Gastrointestinal system: Abdomen is nondistended, soft and nontender. No organomegaly or masses felt. Normal bowel  sounds heard. Central nervous system: Alert and oriented. No focal neurological deficits. Extremities: Symmetric 5 x 5 power. Skin: No rashes, lesions or ulcers Psychiatry: Judgement and insight appear normal. Mood & affect appropriate.     Data Reviewed: I have personally reviewed following labs and imaging studies  CBC: Recent Labs  Lab 05/03/18 1324 05/04/18 0433 05/05/18 0314 05/05/18 2352 05/07/18 0558  WBC 8.1 7.4 6.6 7.4 6.0  NEUTROABS 5.5  --   --   --   --   HGB 14.1 13.0 14.6 14.0 13.7  HCT 45.3 39.0 44.8 43.6 43.3  MCV 94.4 91.3 91.1 92.6 92.3  PLT 363 279 306 350 328   Basic Metabolic Panel: Recent Labs  Lab 05/03/18 1324 05/04/18 1114 05/05/18 0314 05/05/18 2352 05/07/18 0558  NA 131* 137 136 136 133*  K 4.2 3.8 3.3* 4.0 3.8  CL 97* 98 96* 99 95*  CO2 21* 27 26 27 26   GLUCOSE 176* 127* 116* 141* 130*  BUN 9 9 8 10 8   CREATININE 1.12 1.04 1.13 1.02 1.05  CALCIUM 9.1 8.7* 8.4* 8.8* 8.9   Liver Function Tests: Recent Labs  Lab 05/03/18 1324  AST 34  ALT 35  ALKPHOS 45  BILITOT 1.9*  PROT 6.8  ALBUMIN 3.7   Coagulation Profile: No results for input(s): INR, PROTIME in the last 168 hours. Cardiac Enzymes: Recent Labs  Lab 05/03/18 1706 05/03/18 2217 05/04/18 0433  TROPONINI 0.05* 0.09* 0.07*   HbA1C: No results for input(s): HGBA1C in the last 72 hours. CBG: Recent Labs  Lab 05/06/18 1205 05/06/18 1701 05/06/18 2125 05/07/18 0739 05/07/18 1145  GLUCAP 138* 147* 118* 116* 164*    No results found for this or any previous visit (from the past 240 hour(s)).       Radiology Studies: Mr Cardiac Morphology W Wo Contrast  Result Date: 05/07/2018 CLINICAL DATA:  67 year old male wit new diagnosis of CHF and 3-vessel disease. Evaluate for viability. EXAM: CARDIAC MRI TECHNIQUE: The patient was scanned on a 1.5 Tesla GE magnet. A dedicated cardiac coil was used. Functional imaging was done using Fiesta sequences. 2,3, and 4 chamber  views were done to assess for RWMA's. Modified Simpson's rule using a short axis stack was used to calculate an ejection fraction on a dedicated work [employer]. The patient received 10 cc of Gadavist. After 10 minutes inversion recovery sequences were used to assess for infiltration and scar tissue. CONTRAST:  10 cc  of Gadavist FINDINGS: 1. Severely dilated left ventricle with normal thickness and severely decreased systolic function (LVEF = 21%). There is global hypokinesis and akinesis in the basal and mid inferolateral, anterolateral and apical lateral and inferior walls. There is late gadolinium enhancement in the basal and mid inferolateral and apical lateral, inferior walls (75-100%, poor chance of recovery if revascularized) and mid inferior and anterolateral walls (25-50%, good chance of recovery if revascularized). LVEDD: 71 mm LVESD: 63 mm LVEDV: 374 ml LVESV: 296 ml SV: 78 ml CO: 7.9 L/min Myocardial mass: 187 g 2. Mildly dilated right ventricle with moderately to severely decreased systolic function (LVEF = 25%). There is diffuse hypokinesis.  3.  Moderately dilated left atrium and mildly dilated right atrium. 4. Normal size of the aortic root, ascending aorta and pulmonary artery. 5.  Severe mitral and mild tricuspid regurgitation. 6.  Normal pericardium.  Trivial pericardial effusion. IMPRESSION: 1. Severely dilated left ventricle with normal thickness and severely decreased systolic function (LVEF = 21%). There is global hypokinesis and akinesis in the basal and mid inferolateral, anterolateral and apical lateral and inferior walls. There is late gadolinium enhancement in the basal and mid inferolateral and apical lateral, inferior walls (75-100%, poor chance of recovery if revascularized) and mid inferior and anterolateral walls (25-50%, good chance of recovery if revascularized). 2. Mildly dilated right ventricle with moderately to severely decreased systolic function (LVEF =  25%). There is diffuse hypokinesis. 3. Moderately dilated left atrium and mildly dilated right atrium. 4. Severe mitral and mild tricuspid regurgitation. 5. Trivial pericardial effusion. Myocardium viable in 12 out of 17 left ventricular segments. Electronically Signed   By: Tobias Alexander   On: 05/07/2018 12:18        Scheduled Meds: . atorvastatin  80 mg Oral q1800  . digoxin  0.125 mg Oral Daily  . furosemide  40 mg Oral Daily  . Influenza vac split quadrivalent PF  0.5 mL Intramuscular Tomorrow-1000  . insulin aspart  0-5 Units Subcutaneous QHS  . insulin aspart  0-9 Units Subcutaneous TID WC  . sodium chloride flush  3 mL Intravenous Q12H  . sodium chloride flush  3 mL Intravenous Q12H   Continuous Infusions: . sodium chloride    . sodium chloride    . heparin 1,400 Units/hr (05/07/18 0452)     LOS: 4 days     Barnetta Chapel, MD. [facility] Pager [phone].  If 7PM-7AM, please contact night-coverage [email or web address] Password [facility] 05/07/2018, 12:36 PM

## 2018-05-07 NOTE — Progress Notes (Signed)
Progress Note  Patient Name: JAMI OHLIN Date of Encounter: 05/07/2018  Primary Cardiologist: Tobias Alexander, MD   Subjective   Doing well, no chest pain or SOB.  Inpatient Medications    Scheduled Meds: . atorvastatin  80 mg Oral q1800  . digoxin  0.125 mg Oral Daily  . furosemide  40 mg Oral Daily  . Influenza vac split quadrivalent PF  0.5 mL Intramuscular Tomorrow-1000  . insulin aspart  0-5 Units Subcutaneous QHS  . insulin aspart  0-9 Units Subcutaneous TID WC  . sodium chloride flush  3 mL Intravenous Q12H  . sodium chloride flush  3 mL Intravenous Q12H   Continuous Infusions: . sodium chloride    . sodium chloride    . heparin 1,400 Units/hr (05/07/18 0452)   PRN Meds: sodium chloride, sodium chloride, acetaminophen **OR** acetaminophen, ondansetron **OR** ondansetron (ZOFRAN) IV, sodium chloride flush, sodium chloride flush, zolpidem   Vital Signs    Vitals:   05/06/18 0518 05/06/18 1130 05/06/18 1932 05/07/18 0346  BP:  110/67 94/69 91/66   Pulse:  95 100 90  Resp:  18 18 18   Temp:  98 F (36.7 C) (!) 97.4 F (36.3 C) 97.8 F (36.6 C)  TempSrc:  Oral Oral Oral  SpO2:  97% 96% 98%  Weight: 75.7 kg   76.7 kg  Height:        Intake/Output Summary (Last 24 hours) at 05/07/2018 1103 Last data filed at 05/07/2018 0740 Gross per 24 hour  Intake 1087.02 ml  Output 1275 ml  Net -187.98 ml   Filed Weights   05/05/18 0511 05/06/18 0518 05/07/18 0346  Weight: 76.2 kg 75.7 kg 76.7 kg    Telemetry    NSR. No ventricular ectopy.  - Personally Reviewed  ECG    Not performed today- Personally Reviewed  Physical Exam   GEN: No acute distress.   Neck: No JVD Cardiac: RRR, 2/6 murmur at Apex Respiratory: Clear to auscultation bilaterally. GI: Soft, nontender, non-distended  MS: No edema; No deformity. Neuro:  Nonfocal  Psych: Normal affect   Labs    Chemistry Recent Labs  Lab 05/03/18 1324  05/05/18 0314 05/05/18 2352 05/07/18 0558    NA 131*   < > 136 136 133*  K 4.2   < > 3.3* 4.0 3.8  CL 97*   < > 96* 99 95*  CO2 21*   < > 26 27 26   GLUCOSE 176*   < > 116* 141* 130*  BUN 9   < > 8 10 8   CREATININE 1.12   < > 1.13 1.02 1.05  CALCIUM 9.1   < > 8.4* 8.8* 8.9  PROT 6.8  --   --   --   --   ALBUMIN 3.7  --   --   --   --   AST 34  --   --   --   --   ALT 35  --   --   --   --   ALKPHOS 45  --   --   --   --   BILITOT 1.9*  --   --   --   --   GFRNONAA >60   < > >60 >60 >60  GFRAA >60   < > >60 >60 >60  ANIONGAP 13   < > 14 10 12    < > = values in this interval not displayed.     Hematology Recent Labs  Lab 05/05/18 [phone]  05/05/18 2352 05/07/18 0558  WBC 6.6 7.4 6.0  RBC 4.92 4.71 4.69  HGB 14.6 14.0 13.7  HCT 44.8 43.6 43.3  MCV 91.1 92.6 92.3  MCH 29.7 29.7 29.2  MCHC 32.6 32.1 31.6  RDW 13.3 13.4 13.4  PLT 306 350 328    Cardiac Enzymes Recent Labs  Lab 05/03/18 1706 05/03/18 2217 05/04/18 0433  TROPONINI 0.05* 0.09* 0.07*    Recent Labs  Lab 05/03/18 1328  TROPIPOC 0.09*     BNP Recent Labs  Lab 05/03/18 1324  BNP 2,703.3*     DDimer No results for input(s): DDIMER in the last 168 hours.   Radiology    No results found.  Cardiac Studies   2D Echo 05/04/18 Study Conclusions  - Left ventricle: The cavity size was moderately dilated. Systolic   function was severely reduced. The estimated ejection fraction   was in the range of 20% to 25%. Indeterminate diastolic function.   Severe diffuse hypokinesis. - Regional wall motion abnormality: Akinesis of the mid   inferolateral, basal-mid anterolateral, and apical lateral   myocardium. - Aortic valve: Transvalvular velocity was within the normal range.   There was no stenosis. There was no regurgitation. - Mitral valve: Mitral valve leaflet tenting. There was moderate   regurgitation. The PISA quantitation may slightly underestimate   severity, with one jet more central, and one jet eccentric and   anteriorly directed  (clip 71). - Left atrium: The atrium was mildly dilated. - Right ventricle: The cavity size was mildly dilated. Wall   thickness was normal. Systolic function was moderately reduced. - Tricuspid valve: There was trivial regurgitation. - Inferior vena cava: The vessel was dilated. The respirophasic   diameter changes were blunted (< 50%), consistent with elevated   central venous pressure. - Pericardium, extracardiac: Pleural effusion.   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  There is severe (4+) mitral regurgitation.  SEVERE MULTIVESSEL DISEASE  Prox RCA to Mid RCA lesion is 90% stenosed. Mid RCA to Dist RCA lesion is 100% stenosed with 100% stenosed side branch in Post Atrio.  Prox LAD lesion is 99% stenosed. Mid LAD-1 lesion is 80% stenosed. Mid LAD-2 lesion is 90% stenosed. Dist LAD lesion is 85% stenosed.  Prox Cx lesion is 95% stenosed. 2nd Mrg lesion is 90% stenosed with 90% stenosed side branch in Lat 2nd Mrg.   SUMMARY  Severe multivessel CAD: Diffuse proximal RCA disease with mid 100% CTO filling with bridging collaterals and right-to-left collaterals, separate ostium circumflex with proximal 95% stenosis and bifurcation 90% stenosis of OM1, 99% mid LAD after very large septal perforator trunk (perfusing PDA) with bridging collaterals filling mid to distal LAD with a large diagonal branch and diffuse disease throughout the distal LAD.  Moderate pulmonary hypertension likely secondary to pulmonary venous congestion and mitral regurgitation.  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   RECOMMENDATION  Return to nursing unit for TR band removal.  Continue aggressive CHF management  The patient would likely best benefit from CABG plus possible mitral valve repair. --We will consult  CVTS  Will place on IV heparin 8 hours after sheath removal  Consider viability study as part of assessment for revascularization options.  Percutaneous options would be limited and extremely high risk given his low output and multivessel disease.  Would likely need support with Impella  Recommend Aspirin 81mg  daily for severe CAD.     Patient Profile     Kayden Hutmacher Cooperis a 67 y.o.malewith a history of hypertension, hyperlipidemia, FH of premature CAD in brother andtobacco abuse, who is being seen today for the evaluation ofshortness of breathat the request of Dr. Waymon Amato, Internal Medicine. After initial w/u, he has been diagnosed with acute CHF and found to have severe LV systolic dysfunction, moderate to severe MR and severe multivessel CAD.   Assessment & Plan   1. Acute CHF: recent history includes progressive dyspnea, orthopnea and PND x 3 weeks. ED w/u notable for elevated BNP at 2,703 and CXR also c/w acute CHF with pulmonary vascular congestion with small pleural effusions bilaterally and interstitial edema. Found to have systolic HF with reduced EF of 20-25%, moderate MR and severe multivessel disease.  -  He was started on IV Lasix for diuresis. Great urinary response. Net I/Os negative 5.3 L since admit. Breathing significantly improved. His RHC yesterday confirmed elevated LVEDP c/w volume overload. We will continue diuretics. Lasix 40 daily. Renal function and electrolytes are stable. Continue strict I/Os and daily weights. Low sodium diet. BP at this time is too soft for ACE/ARB. Once BP allows, will initiate ARB vs Entrestro. Will  later add BB, once euvolemic, if BP and HR allows.   2. CAD: LHC yesterday showed severe multivessel disease, as outlined in cath report above. He is CP free. The patient would likely best benefit from CABG plus possible mitral valve repair. He was seen by Dr. Dorris Fetch yesterday who has recommended a viability study (MRI). Final determination regarding CABG will be made next week. For now ,continue high dose statin. No BB due to acute HF and soft BP.   3. 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 BP limiting medical therapy. No room in pressures to add an ARB or Entresto. No room for BB. We will continue to monitor and try to initiate guidelines directed medical therapy for systolic HF if/when possible. After revascularization and period of medical therapy, he will ultimately need a repeat echocardiogram to reassess LVEF and if EF remains <35%, he will need referral to EP for consideration for ICD. I have reviewed tele. He has had no ventricular ectopy. Will continue to monitor.  4. Mitral Regurgitation: moderate to severe. Felt to be ischemic. He will likely need MV replacement vs repair, per CT surgery recommendations. Further surgical w/u pending.   5. HLD HDL low at 31 mg/dL. LDL is elevated at 87 and TG 57. Given multivessel CAD, LDL goal is < 70 mg/dL. He is now on high dose statin therapy with atorvastatin 80 mg. He will need repeat FLP and HFTs in 6-8 weeks.   6. DM: better controlled. His Hgb A1c 8 years ago was 15.2. Hgb A1c now is 7.1. Continue management per PCP.   S/P cath with findings of Severe multivessel CAD and severe MR, elevated LVEDP, moderate pulmonary hypertension. CT surgery consulted and planning for a possible CABG and MV repair. Cardiac MRI done last night, results are pending. He has diuresed > 8 L in 4 days with significant improvement, now on PO lasix, appears euvolemic. Crea is stable.  BP soft. Continue ASA, atorvastatin, digoxin, no  BB or ACEI/ARB yet.  Tobias AlexanderKatarina  Codie Krogh, MD 05/07/2018

## 2018-05-08 DIAGNOSIS — I25810 Atherosclerosis of coronary artery bypass graft(s) without angina pectoris: Secondary | ICD-10-CM

## 2018-05-08 DIAGNOSIS — I340 Nonrheumatic mitral (valve) insufficiency: Secondary | ICD-10-CM

## 2018-05-08 DIAGNOSIS — I420 Dilated cardiomyopathy: Secondary | ICD-10-CM

## 2018-05-08 DIAGNOSIS — I25119 Atherosclerotic heart disease of native coronary artery with unspecified angina pectoris: Secondary | ICD-10-CM

## 2018-05-08 DIAGNOSIS — I214 Non-ST elevation (NSTEMI) myocardial infarction: Secondary | ICD-10-CM

## 2018-05-08 LAB — GLUCOSE, CAPILLARY
GLUCOSE-CAPILLARY: 128 mg/dL — AB (ref 70–99)
Glucose-Capillary: 126 mg/dL — ABNORMAL HIGH (ref 70–99)
Glucose-Capillary: 152 mg/dL — ABNORMAL HIGH (ref 70–99)
Glucose-Capillary: 112 mg/dL — ABNORMAL HIGH (ref 70–99)

## 2018-05-08 LAB — BASIC METABOLIC PANEL
Anion gap: 10 (ref 5–15)
BUN: 7 mg/dL — ABNORMAL LOW (ref 8–23)
CALCIUM: 8.8 mg/dL — AB (ref 8.9–10.3)
CO2: 26 mmol/L (ref 22–32)
Chloride: 97 mmol/L — ABNORMAL LOW (ref 98–111)
Creatinine, Ser: 1.14 mg/dL (ref 0.61–1.24)
GFR calc non Af Amer: >60 mL/min (ref >60)
GLUCOSE: 131 mg/dL — AB (ref 70–99)
Potassium: 3.8 mmol/L (ref 3.5–5.1)
Sodium: 133 mmol/L — ABNORMAL LOW (ref 135–145)

## 2018-05-08 LAB — CBC
HCT: 40.6 % (ref 39.0–52.0)
Hemoglobin: 13.7 g/dL (ref 13.0–17.0)
MCH: 30.7 pg (ref 26.0–34.0)
MCHC: 33.7 g/dL (ref 30.0–36.0)
MCV: 91 fL (ref 80.0–100.0)
Platelets: 265 10*3/uL (ref 150–400)
RBC: 4.46 MIL/uL (ref 4.22–5.81)
RDW: 13.2 % (ref 11.5–15.5)
WBC: 5.6 10*3/uL (ref 4.0–10.5)
nRBC: 0 % (ref 0.0–0.2)

## 2018-05-08 LAB — HEPARIN LEVEL (UNFRACTIONATED)
Heparin Unfractionated: 0.51 IU/mL (ref 0.30–0.70)
Heparin Unfractionated: 0.49 IU/mL (ref 0.30–0.70)

## 2018-05-08 MED ORDER — ASPIRIN EC 81 MG PO TBEC
81.0000 mg | DELAYED_RELEASE_TABLET | Freq: Every day | ORAL | Status: DC
  Administered 2018-05-08 – 2018-05-12 (×5): 81 mg via ORAL
  Filled 2018-05-08 (×5): qty 1

## 2018-05-08 NOTE — Progress Notes (Signed)
Progress Note  Patient Name: Michael Banks Date of Encounter: 05/08/2018  Primary Cardiologist: Tobias Alexander, MD   Subjective   No complaints today. Denies dyspnea. No CP.   Inpatient Medications    Scheduled Meds: . atorvastatin  80 mg Oral q1800  . digoxin  0.125 mg Oral Daily  . furosemide  40 mg Oral Daily  . insulin aspart  0-5 Units Subcutaneous QHS  . insulin aspart  0-9 Units Subcutaneous TID WC  . sodium chloride flush  3 mL Intravenous Q12H  . sodium chloride flush  3 mL Intravenous Q12H   Continuous Infusions: . sodium chloride    . sodium chloride    . heparin 1,250 Units/hr (05/08/18 0008)   PRN Meds: sodium chloride, sodium chloride, acetaminophen **OR** acetaminophen, ondansetron **OR** ondansetron (ZOFRAN) IV, sodium chloride flush, sodium chloride flush, zolpidem   Vital Signs    Vitals:   05/07/18 1147 05/07/18 2019 05/08/18 0536 05/08/18 0539  BP: 99/70 99/70 95/70    Pulse: 98 94 93   Resp: 18 18 18    Temp: 97.6 F (36.4 C) (!) 97.5 F (36.4 C) (!) 97.4 F (36.3 C)   TempSrc: Oral Oral Oral   SpO2: 93% 94% 93%   Weight:    76.9 kg  Height:        Intake/Output Summary (Last 24 hours) at 05/08/2018 1014 Last data filed at 05/08/2018 0900 Gross per 24 hour  Intake 958 ml  Output 1300 ml  Net -342 ml   Filed Weights   05/06/18 0518 05/07/18 0346 05/08/18 0539  Weight: 75.7 kg 76.7 kg 76.9 kg    Telemetry    NSR - Personally Reviewed  ECG    Not performed today - Personally Reviewed  Physical Exam   GEN: No acute distress.   Neck: No JVD Cardiac: RRR, no murmurs, rubs, or gallops.  Respiratory: Clear to auscultation bilaterally. GI: Soft, nontender, non-distended  MS: No edema; No deformity. Neuro:  Nonfocal  Psych: Normal affect   Labs    Chemistry Recent Labs  Lab 05/03/18 1324  05/05/18 2352 05/07/18 0558 05/08/18 0304  NA 131*   < > 136 133* 133*  K 4.2   < > 4.0 3.8 3.8  CL 97*   < > 99 95* 97*  CO2  21*   < > 27 26 26   GLUCOSE 176*   < > 141* 130* 131*  BUN 9   < > 10 8 7*  CREATININE 1.12   < > 1.02 1.05 1.14  CALCIUM 9.1   < > 8.8* 8.9 8.8*  PROT 6.8  --   --   --   --   ALBUMIN 3.7  --   --   --   --   AST 34  --   --   --   --   ALT 35  --   --   --   --   ALKPHOS 45  --   --   --   --   BILITOT 1.9*  --   --   --   --   GFRNONAA >60   < > >60 >60 >60  GFRAA >60   < > >60 >60 >60  ANIONGAP 13   < > 10 12 10    < > = values in this interval not displayed.     Hematology Recent Labs  Lab 05/05/18 2352 05/07/18 0558 05/08/18 0304  WBC 7.4 6.0 5.6  RBC 4.71 4.69  4.46  HGB 14.0 13.7 13.7  HCT 43.6 43.3 40.6  MCV 92.6 92.3 91.0  MCH 29.7 29.2 30.7  MCHC 32.1 31.6 33.7  RDW 13.4 13.4 13.2  PLT 350 328 265    Cardiac Enzymes Recent Labs  Lab 05/03/18 1706 05/03/18 2217 05/04/18 0433  TROPONINI 0.05* 0.09* 0.07*    Recent Labs  Lab 05/03/18 1328  TROPIPOC 0.09*     BNP Recent Labs  Lab 05/03/18 1324  BNP 2,703.3*     DDimer No results for input(s): DDIMER in the last 168 hours.   Radiology    Mr Cardiac Morphology W Wo Contrast  Result Date: 05/07/2018 CLINICAL DATA:  67 year old male wit new diagnosis of CHF and 3-vessel disease. Evaluate for viability. EXAM: CARDIAC MRI TECHNIQUE: The patient was scanned on a 1.5 Tesla GE magnet. A dedicated cardiac coil was used. Functional imaging was done using Fiesta sequences. 2,3, and 4 chamber views were done to assess for RWMA's. Modified Simpson's rule using a short axis stack was used to calculate an ejection fraction on a dedicated work Research officer, trade unionstation using Circle software. The patient received 10 cc of Gadavist. After 10 minutes inversion recovery sequences were used to assess for infiltration and scar tissue. CONTRAST:  10 cc  of Gadavist FINDINGS: 1. Severely dilated left ventricle with normal thickness and severely decreased systolic function (LVEF = 21%). There is global hypokinesis and akinesis in the basal and  mid inferolateral, anterolateral and apical lateral and inferior walls. There is late gadolinium enhancement in the basal and mid inferolateral and apical lateral, inferior walls (75-100%, poor chance of recovery if revascularized) and mid inferior and anterolateral walls (25-50%, good chance of recovery if revascularized). LVEDD: 71 mm LVESD: 63 mm LVEDV: 374 ml LVESV: 296 ml SV: 78 ml CO: 7.9 L/min Myocardial mass: 187 g 2. Mildly dilated right ventricle with moderately to severely decreased systolic function (LVEF = 25%). There is diffuse hypokinesis. 3.  Moderately dilated left atrium and mildly dilated right atrium. 4. Normal size of the aortic root, ascending aorta and pulmonary artery. 5.  Severe mitral and mild tricuspid regurgitation. 6.  Normal pericardium.  Trivial pericardial effusion. IMPRESSION: 1. Severely dilated left ventricle with normal thickness and severely decreased systolic function (LVEF = 21%). There is global hypokinesis and akinesis in the basal and mid inferolateral, anterolateral and apical lateral and inferior walls. There is late gadolinium enhancement in the basal and mid inferolateral and apical lateral, inferior walls (75-100%, poor chance of recovery if revascularized) and mid inferior and anterolateral walls (25-50%, good chance of recovery if revascularized). 2. Mildly dilated right ventricle with moderately to severely decreased systolic function (LVEF = 25%). There is diffuse hypokinesis. 3. Moderately dilated left atrium and mildly dilated right atrium. 4. Severe mitral and mild tricuspid regurgitation. 5. Trivial pericardial effusion. Myocardium viable in 12 out of 17 left ventricular segments. Electronically Signed   By: Tobias AlexanderKatarina  Nelson   On: 05/07/2018 12:18   [place] Doppler Pre Cabg  Result Date: 05/08/2018 PREOPERATIVE VASCULAR EVALUATION  Indications:  Pre Cabg. Risk Factors: Coronary artery disease. Performing Technologist: Milta DeitersSlaughter, [place] [facility]  Examination  Guidelines: A complete evaluation includes B-mode imaging, spectral Doppler, color Doppler, and power Doppler as needed of all accessible portions of each vessel. Bilateral testing is considered an integral part of a complete examination. Limited examinations for reoccurring indications may be performed as noted.  Right Carotid Findings: +----------+--------+--------+--------+------------+--------------------+           PSV cm/sEDV  cm/sStenosisDescribe    Comments             +----------+--------+--------+--------+------------+--------------------+ CCA Prox  62      12                          mild intimal changes +----------+--------+--------+--------+------------+--------------------+ CCA Distal58      13                          mild intimal changes +----------+--------+--------+--------+------------+--------------------+ ICA Prox  28      10              heterogenousmild plaque          +----------+--------+--------+--------+------------+--------------------+ ICA Mid   52      16              heterogenousmild plaque          +----------+--------+--------+--------+------------+--------------------+ ICA Distal62      26                                               +----------+--------+--------+--------+------------+--------------------+ ECA       70      8                                                +----------+--------+--------+--------+------------+--------------------+ Portions of this table do not appear on this page. +----------+--------+-------+--------+------------+           PSV cm/sEDV cmsDescribeArm Pressure +----------+--------+-------+--------+------------+ [id number]                     96           +----------+--------+-------+--------+------------+ +---------+--------+--+--------+-+ VertebralPSV cm/s29EDV cm/s5 +---------+--------+--+--------+-+ Left Carotid Findings:  +----------+--------+--------+--------+----------------------+-----------------+           PSV cm/sEDV cm/sStenosisDescribe              Comments          +----------+--------+--------+--------+----------------------+-----------------+ CCA Prox  66      14                                    mild intimal                                                              changes           +----------+--------+--------+--------+----------------------+-----------------+ CCA Distal52      14                                    mild intimal  changes           +----------+--------+--------+--------+----------------------+-----------------+ ICA Prox  37      13              heterogenous and focalmild plaque       +----------+--------+--------+--------+----------------------+-----------------+ ICA Mid   58      20                                                      +----------+--------+--------+--------+----------------------+-----------------+ ICA Distal59      22                                                      +----------+--------+--------+--------+----------------------+-----------------+ ECA       69      7                                     minimal intimal                                                           changes           +----------+--------+--------+--------+----------------------+-----------------+ +----------+--------+--------+--------+------------+ SubclavianPSV cm/sEDV cm/sDescribeArm Pressure +----------+--------+--------+--------+------------+           75                      95           +----------+--------+--------+--------+------------+ +---------+--------+--+--------+--+ VertebralPSV cm/s48EDV cm/s16 +---------+--------+--+--------+--+  ABI Findings: +--------+------------------+-----+---------+--------+ Right   Rt Pressure (mmHg)IndexWaveform  Comment  +--------+------------------+-----+---------+--------+ Brachial96                     triphasic         +--------+------------------+-----+---------+--------+ PTA     96                1.00 triphasic         +--------+------------------+-----+---------+--------+ DP      104               1.08 triphasic         +--------+------------------+-----+---------+--------+ +--------+------------------+-----+---------+-------+ Left    Lt Pressure (mmHg)IndexWaveform Comment +--------+------------------+-----+---------+-------+ Brachial95                     triphasic        +--------+------------------+-----+---------+-------+ PTA     98                1.02 triphasic        +--------+------------------+-----+---------+-------+ DP      98                     triphasic        +--------+------------------+-----+---------+-------+ +-------+---------------+----------------+ ABI/TBIToday's ABI/TBIPrevious ABI/TBI +-------+---------------+----------------+ Right  1.08                            +-------+---------------+----------------+  Left   1.02                            +-------+---------------+----------------+  Right Doppler Findings: +-----------+--------+-----+---------+--------+ Site       PressureIndexDoppler  Comments +-----------+--------+-----+---------+--------+ Brachial   96           triphasic         +-----------+--------+-----+---------+--------+ Radial                  triphasic         +-----------+--------+-----+---------+--------+ Ulnar                   triphasic         +-----------+--------+-----+---------+--------+ Palmar Arch                      normal   +-----------+--------+-----+---------+--------+  Left Doppler Findings: +-----------+--------+-----+---------+--------+ Site       PressureIndexDoppler  Comments +-----------+--------+-----+---------+--------+ Brachial   95           triphasic          +-----------+--------+-----+---------+--------+ Radial                  triphasic         +-----------+--------+-----+---------+--------+ Ulnar                   triphasic         +-----------+--------+-----+---------+--------+ Palmar Arch                      normal   +-----------+--------+-----+---------+--------+  Summary: Right Carotid: Velocities in the right ICA are consistent with a 1-39% stenosis. Left Carotid: Velocities in the left ICA are consistent with a 1-39% stenosis. Vertebrals:  Bilateral vertebral arteries demonstrate antegrade flow. Subclavians: Normal flow hemodynamics were seen in bilateral subclavian              arteries. Right ABI: Resting right ankle-brachial index is within normal range. No evidence of significant right lower extremity arterial disease. Left ABI: Resting left ankle-brachial index is within normal range. No evidence of significant left lower extremity arterial disease. Right Upper Extremity: Doppler waveforms remain within normal limits with right radial compression. Doppler waveforms remain within normal limits with right ulnar compression. Left Upper Extremity: Doppler waveforms remain within normal limits with left radial compression. Doppler waveforms remain within normal limits with left ulnar compression.   Electronically signed by Fabienne Bruns MD on 05/08/2018 at 9:21:05 AM.    Final     Cardiac Studies   2D Echo 05/04/18 Study Conclusions  - Left ventricle: The cavity size was moderately dilated. Systolic function was severely reduced. The estimated ejection fraction was in the range of 20% to 25%. Indeterminate diastolic function. Severe diffuse hypokinesis. - Regional wall motion abnormality: Akinesis of the mid inferolateral, basal-mid anterolateral, and apical lateral myocardium. - Aortic valve: Transvalvular velocity was within the normal range. There was no stenosis. There was no regurgitation. - Mitral valve:  Mitral valve leaflet tenting. There was moderate regurgitation. The PISA quantitation may slightly underestimate severity, with one jet more central, and one jet eccentric and anteriorly directed (clip 71). - Left atrium: The atrium was mildly dilated. - Right ventricle: The cavity size was mildly dilated. Wall thickness was normal. Systolic function was moderately reduced. - Tricuspid valve: There was trivial regurgitation. - Inferior vena cava: The vessel was dilated. The respirophasic diameter changes were  blunted (<50%), consistent with elevated central venous pressure. - Pericardium, extracardiac: Pleural effusion.   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  There is severe (4+) mitral regurgitation.  SEVERE MULTIVESSEL DISEASE  Prox RCA to Mid RCA lesion is 90% stenosed. Mid RCA to Dist RCA lesion is 100% stenosed with 100% stenosed side branch in Post Atrio.  Prox LAD lesion is 99% stenosed. Mid LAD-1 lesion is 80% stenosed. Mid LAD-2 lesion is 90% stenosed. Dist LAD lesion is 85% stenosed.  Prox Cx lesion is 95% stenosed. 2nd Mrg lesion is 90% stenosed with 90% stenosed side branch in Lat 2nd Mrg.  SUMMARY  Severe multivessel CAD: Diffuse proximal RCA disease with mid 100% CTO filling with bridging collaterals and right-to-left collaterals, separate ostium circumflex with proximal 95% stenosis and bifurcation 90% stenosis of OM1, 99% mid LAD after very large septal perforator trunk (perfusing PDA) with bridging collaterals filling mid to distal LAD with a large diagonal branch and diffuse disease throughout the distal LAD.  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   RECOMMENDATION  Return to nursing unit for TR band removal.  Continue aggressive CHF management  The patient would likely best benefit from CABG plus possible mitral valve repair. --We will consult CVTS  Will place on IV heparin 8 hours after sheath removal  Consider viability study as part of assessment for revascularization options.  Percutaneous options would be limited and extremely high risk given his low output and multivessel disease. Would likely need support with Impella  Recommend Aspirin 81mg  daily for severe CAD.     Patient Profile     Dreyson Mishkin Cooperis a 67 y.o.malewith a history of hypertension, hyperlipidemia,FH of premature CAD in brotherandtobacco abuse, who is being seen today for the evaluation ofshortness of breathat the request of Dr.Hongalgi, Internal Medicine. After initial w/u, he has been diagnosed with acute CHF and found to have severe LV systolic dysfunction, moderate to severe MR and severe multivessel CAD.   Assessment & Plan   1. Acute CHF: recent history includes progressive dyspnea, orthopnea and PND x 3 weeks. ED w/unotable forelevated BNP at 2,703 and CXR also c/w acute CHF withpulmonary vascular congestion with small pleural effusions bilaterally and interstitial edema.Found to have systolic HF with reduced EF of 20-25%, moderate MR and severe multivessel disease.  -  He was started on IV Lasix for diuresis. Good response to diuretics. Net I/Os negative 5.8 L since admit.Breathing significantly improved. Continue PO Lasix, 40 mg daily.Renal function and electrolytes are stable. Continue strict  I/Os and daily weights. Low sodium diet. BP at this time remains too soft for ACE/ARB and  blocker. Once BP allows,will try to initiate ARB vs Entrestro. Will later try to add  blocker.  2. CAD: LHC this admit showed severe multivessel disease, as outlined in cath report above. He is CP free. The patient would likely best benefit from CABG plus possible mitral valve repair. He was seen by Dr. Dorris Fetch who has recommended a viability study (  MRI). Final determination regarding CABG will be made next week. For now, continue high dose statin. No BB due to acute HF and soft BP. He is CP free.   3. 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 BP limiting medical therapy. No room in pressures to add an ARB or Entresto. No room for  blocker. We will continue to monitor and try to initiate guidelines directed medical therapy for systolic HF if/when possible. After revascularization and period of medical therapy, he will ultimately need a repeat echocardiogram to reassess LVEF and if EF remains <35%, he will need referral to EP for consideration for ICD. I have reviewed tele. He has had no ventricular ectopy. Will continue to monitor.  4. Mitral Regurgitation: moderate to severe. Felt to be ischemic. He will likely need MV replacement vs repair, per CT surgery recommendations. Further surgical w/u pending.   5. HLD HDL low at 31 mg/dL. LDL is elevated at 87 and TG 57. Given multivessel CAD, LDL goal is < 70 mg/dL. He is now on high dose statin therapy with atorvastatin 80 mg. He will need repeat FLP and HFTs in 6-8 weeks.   6. DM: better controlled. His Hgb A1c 8 years ago was 15.2. Hgb A1c now is 7.1. Continue management per PCP.  7. Cardiac MRI: performed for viability study prior to potential CABG. Per report, there is late gadolinium enhancement in the basal and mid inferolateral and apical lateral, inferior walls (75-100%, poor chance of recovery if revascularized) and mid inferior and anterolateral  walls (25-50%, good chance of recovery if revascularized). Dr. Dorris Fetch will decide on CABG.   MD to follow with further recommendations.   For questions or updates, please contact [facility] Please consult [email or web address] for contact info under        Signed, Robbie Lis, PA-C  05/08/2018, 10:14 AM

## 2018-05-08 NOTE — Progress Notes (Signed)
ANTICOAGULATION CONSULT NOTE - Follow Up Consult  Pharmacy Consult for heparin Indication: CAD  Labs: Recent Labs    05/05/18 2352  05/07/18 0558 05/07/18 2004 05/08/18 0304  HGB 14.0  --  13.7  --  13.7  HCT 43.6  --  43.3  --  40.6  PLT 350  --  328  --  265  HEPARINUNFRC 0.26*   < > 0.74* 0.71* 0.51  CREATININE 1.02  --  1.05  --   --    < > = values in this interval not displayed.    Assessment/Plan:  67yo male therapeutic on heparin after rate change. Will continue gtt at current rate and confirm stable with additional level.   Vernard GamblesVeronda Romana Deaton, PharmD, BCPS  05/08/2018,3:39 AM

## 2018-05-08 NOTE — Progress Notes (Signed)
Hand off report taken by RN Leotis ShamesLauren and continuity of care started from 3pm, reassement done focusing on Neuro and chest and lungs, no any specific complain this time, IV heparin going on @12 .5cc/hr, will continue to monitor.  Demetrios Isaacsekha,RN

## 2018-05-08 NOTE — Progress Notes (Signed)
PROGRESS NOTE   Michael Banks  ZOX:[id number]    DOB: [birth year]    DOA: 05/03/2018  PCP: Patient, No Pcp Per   Brief Narrative:  67 year old male with PMH of HTN, HLD, tobacco abuse, no known CAD, presented to ED on 05/03/2018 with 3 to 4 weeks history of DOE, orthopnea, PND, generalized fatigue and weakness and abdominal bloating.  He reports couple of episodes of left arm pain with exertion about 4 weeks ago but no recurrence since.  Strong family history of early cardiac death in his brother.  Admitted for acute systolic CHF, elevated troponin and new diagnosed cardiomyopathy.  Patient underwent cardiac catheterization on 05/05/2018.  Cardiac catheter revealed multivessel disease.  CABG is planned for later this week.  Input from cardiology team and cardiothoracic team is highly appreciated.    Assessment & Plan:   Active Problems:   Pulmonary edema   Demand ischemia (HCC)   New onset of congestive heart failure (HCC)   Dilated cardiomyopathy (HCC)   Mitral valve insufficiency   Coronary artery disease involving native heart with angina pectoris (HCC)   Acute systolic CHF: 3 to 4 weeks of progressive symptoms as noted above.  BNP 07/29/2001 and chest x-ray consistent with pulmonary edema.  Cardiology was consulted.  Started on IV Lasix 40 mg every 6 hourly.  -3.7 L since admission.  ACEI/ARB not started due to soft blood pressures and beta-blockers not started due to acute CHF.  TTE 11/13: LVEF 20-25% with severe diffuse hypokinesis.  Cardiac catheterization revealed multivessel disease.  CABG is planned for later this week.  Patient is currently being worked up for CABG.    Cardiomyopathy:  Cardiac catheter revealed multivessel disease.   Echo revealed EF of 20 to 25%.  Continue to optimize. Patient is currently on heparin drip, digoxin, Lasix. Coreg and ACE inhibitor or ARB will be added when blood pressure permits.  Elevated troponin: Flat trend.  TTE results as above.  Cardiac  cath as documented above.    Essential hypertension: Has been off of medications PTA.  Soft blood pressures while being diuresed now.  Hyperlipidemia: Continue statins.  Type II DM: A1c 7.1.  SSI here.  Diet controlled at home.   DVT prophylaxis: SCDs Code Status: Full Family Communication: None at bedside Disposition: This will depend on hospital course.  Consultants:  Cardiology  Procedures:  None  Antimicrobials:  None   Subjective: No chest pain. No shortness of breath. Awaiting CABG.   Objective:  Vitals:   05/07/18 2019 05/08/18 0536 05/08/18 0539 05/08/18 1252  BP: 99/70 95/70  97/73  Pulse: 94 93  93  Resp: 18 18  20   Temp: (!) 97.5 F (36.4 C) (!) 97.4 F (36.3 C)  97.7 F (36.5 C)  TempSrc: Oral Oral  Oral  SpO2: 94% 93%  95%  Weight:   76.9 kg   Height:        Examination:  General exam: Pleasant young male, moderately built and nourished lying comfortably supine in bed. Respiratory system: Occasional basal crackles but otherwise clear to auscultation. Respiratory effort normal. Cardiovascular system: S1 & S2 heard, RRR. No JVD, murmurs, rubs, gallops or clicks. No pedal edema.  Telemetry personally reviewed: Sinus rhythm. Gastrointestinal system: Abdomen is nondistended, soft and nontender. No organomegaly or masses felt. Normal bowel sounds heard. Central nervous system: Alert and oriented. No focal neurological deficits. Extremities: Symmetric 5 x 5 power. Skin: No rashes, lesions or ulcers Psychiatry: Judgement and insight appear normal. Mood &  affect appropriate.     Data Reviewed: I have personally reviewed following labs and imaging studies  CBC: Recent Labs  Lab 05/03/18 1324 05/04/18 0433 05/05/18 0314 05/05/18 2352 05/07/18 0558 05/08/18 0304  WBC 8.1 7.4 6.6 7.4 6.0 5.6  NEUTROABS 5.5  --   --   --   --   --   HGB 14.1 13.0 14.6 14.0 13.7 13.7  HCT 45.3 39.0 44.8 43.6 43.3 40.6  MCV 94.4 91.3 91.1 92.6 92.3 91.0  PLT  363 279 306 350 328 265   Basic Metabolic Panel: Recent Labs  Lab 05/04/18 1114 05/05/18 0314 05/05/18 2352 05/07/18 0558 05/08/18 0304  NA 137 136 136 133* 133*  K 3.8 3.3* 4.0 3.8 3.8  CL 98 96* 99 95* 97*  CO2 27 26 27 26 26   GLUCOSE 127* 116* 141* 130* 131*  BUN 9 8 10 8  7*  CREATININE 1.04 1.13 1.02 1.05 1.14  CALCIUM 8.7* 8.4* 8.8* 8.9 8.8*   Liver Function Tests: Recent Labs  Lab 05/03/18 1324  AST 34  ALT 35  ALKPHOS 45  BILITOT 1.9*  PROT 6.8  ALBUMIN 3.7   Coagulation Profile: No results for input(s): INR, PROTIME in the last 168 hours. Cardiac Enzymes: Recent Labs  Lab 05/03/18 1706 05/03/18 2217 05/04/18 0433  TROPONINI 0.05* 0.09* 0.07*   HbA1C: No results for input(s): HGBA1C in the last 72 hours. CBG: Recent Labs  Lab 05/07/18 1641 05/07/18 2127 05/07/18 2228 05/08/18 0725 05/08/18 1253  GLUCAP 95 111* 108* 126* 152*    No results found for this or any previous visit (from the past 240 hour(s)).       Radiology Studies: Vas US Doppler Pre Cabg  Result Date: 05/08/2018 PREOPERATIVE VASCULAR EVALUATION  Indications:  Pre Cabg. Risk Factors: Coronary artery disease. Performing Technologist: Milta Deiters, [place] [facility]  Examination Guidelines: A complete evaluation includes B-mode imaging, spectral Doppler, color Doppler, and power Doppler as needed of all accessible portions of each vessel. Bilateral testing is considered an integral part of a complete examination. Limited examinations for reoccurring indications may be performed as noted.  Right Carotid Findings: +----------+--------+--------+--------+------------+--------------------+           PSV cm/sEDV cm/sStenosisDescribe    Comments             +----------+--------+--------+--------+------------+--------------------+ CCA Prox  62      12                          mild intimal changes +----------+--------+--------+--------+------------+--------------------+ CCA Distal58       13                          mild intimal changes +----------+--------+--------+--------+------------+--------------------+ ICA Prox  28      10              heterogenousmild plaque          +----------+--------+--------+--------+------------+--------------------+ ICA Mid   52      16              heterogenousmild plaque          +----------+--------+--------+--------+------------+--------------------+ ICA Distal62      26                                               +----------+--------+--------+--------+------------+--------------------+  ECA       70      8                                                +----------+--------+--------+--------+------------+--------------------+ Portions of this table do not appear on this page. +----------+--------+-------+--------+------------+           PSV cm/sEDV cmsDescribeArm Pressure +----------+--------+-------+--------+------------+ [id number]                     96           +----------+--------+-------+--------+------------+ +---------+--------+--+--------+-+ VertebralPSV cm/s29EDV cm/s5 +---------+--------+--+--------+-+ Left Carotid Findings: +----------+--------+--------+--------+----------------------+-----------------+           PSV cm/sEDV cm/sStenosisDescribe              Comments          +----------+--------+--------+--------+----------------------+-----------------+ CCA Prox  66      14                                    mild intimal                                                              changes           +----------+--------+--------+--------+----------------------+-----------------+ CCA Distal52      14                                    mild intimal                                                              changes           +----------+--------+--------+--------+----------------------+-----------------+ ICA Prox  37      13              heterogenous and  focalmild plaque       +----------+--------+--------+--------+----------------------+-----------------+ ICA Mid   58      20                                                      +----------+--------+--------+--------+----------------------+-----------------+ ICA Distal59      22                                                      +----------+--------+--------+--------+----------------------+-----------------+ ECA       69      7  minimal intimal                                                           changes           +----------+--------+--------+--------+----------------------+-----------------+ +----------+--------+--------+--------+------------+ SubclavianPSV cm/sEDV cm/sDescribeArm Pressure +----------+--------+--------+--------+------------+           75                      95           +----------+--------+--------+--------+------------+ +---------+--------+--+--------+--+ VertebralPSV cm/s48EDV cm/s16 +---------+--------+--+--------+--+  ABI Findings: +--------+------------------+-----+---------+--------+ Right   Rt Pressure (mmHg)IndexWaveform Comment  +--------+------------------+-----+---------+--------+ Brachial96                     triphasic         +--------+------------------+-----+---------+--------+ PTA     96                1.00 triphasic         +--------+------------------+-----+---------+--------+ DP      104               1.08 triphasic         +--------+------------------+-----+---------+--------+ +--------+------------------+-----+---------+-------+ Left    Lt Pressure (mmHg)IndexWaveform Comment +--------+------------------+-----+---------+-------+ Brachial95                     triphasic        +--------+------------------+-----+---------+-------+ PTA     98                1.02 triphasic        +--------+------------------+-----+---------+-------+ DP      98                      triphasic        +--------+------------------+-----+---------+-------+ +-------+---------------+----------------+ ABI/TBIToday's ABI/TBIPrevious ABI/TBI +-------+---------------+----------------+ Right  1.08                            +-------+---------------+----------------+ Left   1.02                            +-------+---------------+----------------+  Right Doppler Findings: +-----------+--------+-----+---------+--------+ Site       PressureIndexDoppler  Comments +-----------+--------+-----+---------+--------+ Brachial   96           triphasic         +-----------+--------+-----+---------+--------+ Radial                  triphasic         +-----------+--------+-----+---------+--------+ Ulnar                   triphasic         +-----------+--------+-----+---------+--------+ Palmar Arch                      normal   +-----------+--------+-----+---------+--------+  Left Doppler Findings: +-----------+--------+-----+---------+--------+ Site       PressureIndexDoppler  Comments +-----------+--------+-----+---------+--------+ Brachial   95           triphasic         +-----------+--------+-----+---------+--------+ Radial                  triphasic         +-----------+--------+-----+---------+--------+  Ulnar                   triphasic         +-----------+--------+-----+---------+--------+ Palmar Arch                      normal   +-----------+--------+-----+---------+--------+  Summary: Right Carotid: Velocities in the right ICA are consistent with a 1-39% stenosis. Left Carotid: Velocities in the left ICA are consistent with a 1-39% stenosis. Vertebrals:  Bilateral vertebral arteries demonstrate antegrade flow. Subclavians: Normal flow hemodynamics were seen in bilateral subclavian              arteries. Right ABI: Resting right ankle-brachial index is within normal range. No evidence of significant right lower  extremity arterial disease. Left ABI: Resting left ankle-brachial index is within normal range. No evidence of significant left lower extremity arterial disease. Right Upper Extremity: Doppler waveforms remain within normal limits with right radial compression. Doppler waveforms remain within normal limits with right ulnar compression. Left Upper Extremity: Doppler waveforms remain within normal limits with left radial compression. Doppler waveforms remain within normal limits with left ulnar compression.   Electronically signed by Fabienne Bruns MD on 05/08/2018 at 9:21:05 AM.    Final         Scheduled Meds: . aspirin EC  81 mg Oral Daily  . atorvastatin  80 mg Oral q1800  . digoxin  0.125 mg Oral Daily  . furosemide  40 mg Oral Daily  . insulin aspart  0-5 Units Subcutaneous QHS  . insulin aspart  0-9 Units Subcutaneous TID WC  . sodium chloride flush  3 mL Intravenous Q12H  . sodium chloride flush  3 mL Intravenous Q12H   Continuous Infusions: . sodium chloride    . sodium chloride    . heparin 1,250 Units/hr (05/08/18 0008)     LOS: 5 days     Barnetta Chapel, MD. [facility] Pager [phone].  If 7PM-7AM, please contact night-coverage [email or web address] Password [email or web address] 05/08/2018, 2:36 PM

## 2018-05-08 NOTE — Progress Notes (Signed)
ANTICOAGULATION CONSULT NOTE  Pharmacy Consult for Heparin Indication: chest pain/ACS  No Known Allergies  Patient Measurements: Height: 5\' 10"  (177.8 cm) Weight: 169 lb 8 oz (76.9 kg)(scale c) IBW/kg (Calculated) : 73 Heparin Dosing Weight: 79.4   Vital Signs: Temp: 97.4 F (36.3 C) (11/17 0536) Temp Source: Oral (11/17 0536) BP: 95/70 (11/17 0536) Pulse Rate: 93 (11/17 0536)  Labs: Recent Labs    05/05/18 2352  05/07/18 0558 05/07/18 2004 05/08/18 0304 05/08/18 1115  HGB 14.0  --  13.7  --  13.7  --   HCT 43.6  --  43.3  --  40.6  --   PLT 350  --  328  --  265  --   HEPARINUNFRC 0.26*   < > 0.74* 0.71* 0.51 0.49  CREATININE 1.02  --  1.05  --  1.14  --    < > = values in this interval not displayed.    Estimated Creatinine Clearance: 67.6 mL/min (by C-G formula based on SCr of 1.14 mg/dL).   Medical History: History reviewed. No pertinent past medical history.  Assessment: Patient is [age] yoM presenting with shortness of breath and left arm pain with elevated troponin but trend was flat. Pharmacy consulted for heparin dosing. No anticoagulation PTA. Tentative plan is for CABG next week.  Heparin level remains therapeutic at 0.49 this morning on 1250 units/hr. CBC is stable and within normal limits. No bleeding or issues with the infusion reported.  Goal of Therapy:  Heparin level 0.3-0.7 units/ml Monitor platelets by anticoagulation protocol: Yes   Plan:  Continue heparin infusion at 1250 units/hr Daily heparin level and CBC while on heparin Monitor for s/sx of bleeding F/u final plans for CABG next week   Harlow MaresAmy Donyae Kilner, PharmD PGY1 Pharmacy Resident Phone [phone]  05/08/2018   12:14 PM

## 2018-05-09 DIAGNOSIS — I248 Other forms of acute ischemic heart disease: Secondary | ICD-10-CM

## 2018-05-09 LAB — BASIC METABOLIC PANEL
Anion gap: 8 (ref 5–15)
BUN: 6 mg/dL — AB (ref 8–23)
CALCIUM: 8.6 mg/dL — AB (ref 8.9–10.3)
CO2: 25 mmol/L (ref 22–32)
CREATININE: 1.08 mg/dL (ref 0.61–1.24)
Chloride: 100 mmol/L (ref 98–111)
GFR calc Af Amer: >60 mL/min (ref >60)
Glucose, Bld: 202 mg/dL — ABNORMAL HIGH (ref 70–99)
Potassium: 4.5 mmol/L (ref 3.5–5.1)
SODIUM: 133 mmol/L — AB (ref 135–145)

## 2018-05-09 LAB — CBC
HCT: 42.3 % (ref 39.0–52.0)
Hemoglobin: 13.8 g/dL (ref 13.0–17.0)
MCH: 29.7 pg (ref 26.0–34.0)
MCHC: 32.6 g/dL (ref 30.0–36.0)
MCV: 91.2 fL (ref 80.0–100.0)
Platelets: 258 10*3/uL (ref 150–400)
RBC: 4.64 MIL/uL (ref 4.22–5.81)
RDW: 13.3 % (ref 11.5–15.5)
WBC: 5.9 10*3/uL (ref 4.0–10.5)
nRBC: 0 % (ref 0.0–0.2)

## 2018-05-09 LAB — HEPARIN LEVEL (UNFRACTIONATED): Heparin Unfractionated: 0.42 IU/mL (ref 0.30–0.70)

## 2018-05-09 LAB — GLUCOSE, CAPILLARY
GLUCOSE-CAPILLARY: 171 mg/dL — AB (ref 70–99)
Glucose-Capillary: 117 mg/dL — ABNORMAL HIGH (ref 70–99)
Glucose-Capillary: 125 mg/dL — ABNORMAL HIGH (ref 70–99)
Glucose-Capillary: 122 mg/dL — ABNORMAL HIGH (ref 70–99)

## 2018-05-09 NOTE — Progress Notes (Signed)
ANTICOAGULATION CONSULT NOTE  Pharmacy Consult for Heparin Indication: chest pain/ACS  No Known Allergies  Patient Measurements: Height: 5\' 10"  (177.8 cm) Weight: 168 lb 6.4 oz (76.4 kg)(scale c) IBW/kg (Calculated) : 73 Heparin Dosing Weight: 79.4   Vital Signs: Temp: 97.7 F (36.5 C) (11/18 0437) Temp Source: Oral (11/18 0437) BP: 100/77 (11/18 0437) Pulse Rate: 91 (11/18 0437)  Labs: Recent Labs    05/07/18 0558  05/08/18 0304 05/08/18 1115 05/09/18 0522  HGB 13.7  --  13.7  --  13.8  HCT 43.3  --  40.6  --  42.3  PLT 328  --  265  --  258  HEPARINUNFRC 0.74*   < > 0.51 0.49 0.42  CREATININE 1.05  --  1.14  --   --    < > = values in this interval not displayed.    Estimated Creatinine Clearance: 67.6 mL/min (by C-G formula based on SCr of 1.14 mg/dL).   Assessment: Patient is [age] yoM presenting with shortness of breath and left arm pain with elevated troponin but trend was flat. Pharmacy consulted for heparin dosing. No anticoagulation PTA. Tentative plan is for CABG next week.  Heparin level therapeutic this AM  Goal of Therapy:  Heparin level 0.3-0.7 units/ml Monitor platelets by anticoagulation protocol: Yes   Plan:  Continue heparin at 1250 units / hr Daily heparin level and CBC while on heparin Monitor for s/sx of bleeding F/u final plans for CABG next week  Thank you Okey RegalLisa Jeanmarie Mccowen, PharmD [phone] Please utilize Amion for appropriate phone number to reach the unit pharmacist [facility])    05/09/2018   8:48 AM

## 2018-05-09 NOTE — Progress Notes (Signed)
4 Days Post-Op Procedure(s) (LRB): Ultrasound Guidance For Vascular Access RIGHT/LEFT HEART CATH AND CORONARY ANGIOGRAPHY (N/A) Subjective: No complaints this morning  Objective: Vital signs in last 24 hours: Temp:  [97.7 F (36.5 C)-97.9 F (36.6 C)] 97.7 F (36.5 C) (11/18 0437) Pulse Rate:  [91-96] 91 (11/18 0437) Cardiac Rhythm: Normal sinus rhythm (11/18 0703) Resp:  [18-20] 18 (11/18 0437) BP: (93-100)/(69-77) 100/77 (11/18 0437) SpO2:  [94 %-95 %] 95 % (11/18 0437) Weight:  [76.4 kg] 76.4 kg (11/18 0437)  Hemodynamic parameters for last 24 hours:    Intake/Output from previous day: 11/17 0701 - 11/18 0700 In: 987.1 [P.O.:600; I.V.:387.1] Out: 2850 [Urine:2850] Intake/Output this shift: Total I/O In: 240 [P.O.:240] Out: -   General appearance: alert, cooperative and no distress Heart: regular rate and rhythm and + murmur at apex Lungs: clear to auscultation bilaterally  Lab Results: Recent Labs    05/08/18 0304 05/09/18 0522  WBC 5.6 5.9  HGB 13.7 13.8  HCT 40.6 42.3  PLT 265 258   BMET:  Recent Labs    05/07/18 0558 05/08/18 0304  NA 133* 133*  K 3.8 3.8  CL 95* 97*  CO2 26 26  GLUCOSE 130* 131*  BUN 8 7*  CREATININE 1.05 1.14  CALCIUM 8.9 8.8*    PT/INR: No results for input(s): LABPROT, INR in the last 72 hours. 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 Menor that we proceed with  CABG + mitral repair/ replacement. Will be relatively high risk. He is aware that the risks include but are not limited to death, stroke, MI, DVT, PE, bleeding, possible need for transfusion, infection, heart block, as well as other organ system dysfunction including respiratory, renal and GI, as well as the possibility of other unforeseeable complications.   As of now first available time to do procedure is Friday 11/22    LOS: 6 days    Loreli SlotSteven C Brieann Osinski 05/09/2018

## 2018-05-09 NOTE — Progress Notes (Signed)
PROGRESS NOTE   Michael Banks  ZOX:[id number]    DOB: July 11, 1950    DOA: 05/03/2018  PCP: Patient, No Pcp Per   Brief Narrative:  67 year old male with PMH of HTN, HLD, tobacco abuse, no known CAD, presented to ED on 05/03/2018 with 3 to 4 weeks history of DOE, orthopnea, PND, generalized fatigue and weakness and abdominal bloating.  He reports couple of episodes of left arm pain with exertion about 4 weeks ago but no recurrence since.  Strong family history of early cardiac death in his brother.  Admitted for acute systolic CHF, elevated troponin and new diagnosed cardiomyopathy.  Patient underwent cardiac catheterization on 05/05/2018.  Cardiac catheter revealed multivessel disease.  CABG with MV repair planned for 11/22.  Input from cardiology team and cardiothoracic team is highly appreciated.    Assessment & Plan:   Active Problems:   Pulmonary edema   Demand ischemia (HCC)   New onset of congestive heart failure (HCC)   Dilated cardiomyopathy (HCC)   Mitral valve insufficiency   Coronary artery disease involving native heart with angina pectoris (HCC)   Acute systolic & diastolic CHF:  Cardiology was consulted.  Started on IV Lasix.  -7.4 L since admission.  ACEI/ARB not started due to soft blood pressures and beta-blockers not started due to acute CHF.  TTE 11/13: LVEF 20-25% with severe diffuse hypokinesis.  Cardiac catheterization revealed multivessel disease.  Cardiology continues to follow.  Now on oral digoxin 0.125 mg daily and oral Lasix 40 mg daily.  Clinically improved but still mildly volume overloaded.  CAD (three-vessel)/ischemic cardiomyopathy:  Cardiac catheter revealed multivessel disease. Echo revealed EF of 20 to 25%.  Continue to optimize. Patient is currently on heparin drip, digoxin, Lasix. Coreg and ACE inhibitor or ARB will be added when blood pressure permits. Three-vessel CAD on cardiac cath this admission. TCTS follow-up appreciated and plan CABG and MV  repair 10/22.  Moderate to severe MR Suspect ischemic in nature.  Management as indicated above.  Elevated troponin:  Flat trend.  TTE results as above.  Cardiac cath as documented above.  Suspect due CAD and demand ischemia.  Essential hypertension:  Has been off of medications PTA.  Soft blood pressures while being diuresed now.  Blood pressure stable.  Hyperlipidemia:  Continue statins.  Type II DM:  A1c 7.1.  SSI here.  Diet controlled at home.  Reasonable inpatient control.  Tobacco abuse Cessation counseled.  Declines nicotine patch.   DVT prophylaxis: SCDs and IV heparin drip. Code Status: Full Family Communication: None at bedside Disposition: Pending upcoming CABG.  Consultants:  Cardiology Cardiothoracic surgery.  Procedures:  Cardiac cath.  Antimicrobials:  None   Subjective: Overall much improved compared to admission.  Still has intermittent mild DOE and orthopnea but no chest pain.   Objective:  Vitals:   05/08/18 1252 05/08/18 1931 05/09/18 0437 05/09/18 1257  BP: 97/73 93/69 100/77 100/71  Pulse: 93 96 91 91  Resp: 20 18 18 20   Temp: 97.7 F (36.5 C) 97.9 F (36.6 C) 97.7 F (36.5 C) 98.5 F (36.9 C)  TempSrc: Oral Oral Oral Oral  SpO2: 95% 94% 95% 98%  Weight:   76.4 kg   Height:        Examination:  General exam: Pleasant young male, moderately built and nourished lying comfortably propped up in bed Respiratory system: Clear to auscultation. Respiratory effort normal. Cardiovascular system: S1 & S2 heard, RRR. No JVD, murmurs, rubs, gallops or clicks. No pedal edema.  Telemetry personally reviewed: Sinus rhythm Gastrointestinal system: Abdomen is nondistended, soft and nontender. No organomegaly or masses felt. Normal bowel sounds heard.  Stable Central nervous system: Alert and oriented. No focal neurological deficits.  Stable Extremities: Symmetric 5 x 5 power. Skin: No rashes, lesions or ulcers Psychiatry: Judgement and insight  appear normal. Mood & affect appropriate.     Data Reviewed: I have personally reviewed following labs and imaging studies  CBC: Recent Labs  Lab 05/03/18 1324  05/05/18 0314 05/05/18 2352 05/07/18 0558 05/08/18 0304 05/09/18 0522  WBC 8.1   < > 6.6 7.4 6.0 5.6 5.9  NEUTROABS 5.5  --   --   --   --   --   --   HGB 14.1   < > 14.6 14.0 13.7 13.7 13.8  HCT 45.3   < > 44.8 43.6 43.3 40.6 42.3  MCV 94.4   < > 91.1 92.6 92.3 91.0 91.2  PLT 363   < > 306 350 328 265 258   < > = values in this interval not displayed.   Basic Metabolic Panel: Recent Labs  Lab 05/05/18 0314 05/05/18 2352 05/07/18 0558 05/08/18 0304 05/09/18 1009  NA 136 136 133* 133* 133*  K 3.3* 4.0 3.8 3.8 4.5  CL 96* 99 95* 97* 100  CO2 26 27 26 26 25   GLUCOSE 116* 141* 130* 131* 202*  BUN 8 10 8  7* 6*  CREATININE 1.13 1.02 1.05 1.14 1.08  CALCIUM 8.4* 8.8* 8.9 8.8* 8.6*   Liver Function Tests: Recent Labs  Lab 05/03/18 1324  AST 34  ALT 35  ALKPHOS 45  BILITOT 1.9*  PROT 6.8  ALBUMIN 3.7   Coagulation Profile: No results for input(s): INR, PROTIME in the last 168 hours. Cardiac Enzymes: Recent Labs  Lab 05/03/18 1706 05/03/18 2217 05/04/18 0433  TROPONINI 0.05* 0.09* 0.07*   HbA1C: No results for input(s): HGBA1C in the last 72 hours. CBG: Recent Labs  Lab 05/08/18 1648 05/08/18 2122 05/09/18 0723 05/09/18 1228 05/09/18 1626  GLUCAP 128* 112* 117* 171* 125*    No results found for this or any previous visit (from the past 240 hour(s)).       Radiology Studies: No results found.      Scheduled Meds: . aspirin EC  81 mg Oral Daily  . atorvastatin  80 mg Oral q1800  . digoxin  0.125 mg Oral Daily  . furosemide  40 mg Oral Daily  . insulin aspart  0-5 Units Subcutaneous QHS  . insulin aspart  0-9 Units Subcutaneous TID WC  . sodium chloride flush  3 mL Intravenous Q12H  . sodium chloride flush  3 mL Intravenous Q12H   Continuous Infusions: . sodium chloride      . sodium chloride    . heparin 1,250 Units/hr (05/09/18 1720)     LOS: 6 days     Marcellus Scott, MD, FACP, [facility]. [facility] Pager [phone]  If 7PM-7AM, please contact night-coverage [email or web address] Password [facility] 05/09/2018, 6:17 PM

## 2018-05-09 NOTE — Progress Notes (Signed)
Progress Note  Patient Name: Michael Banks Date of Encounter: 05/09/2018  Primary Cardiologist: Tobias Alexander, MD  Cardiothoracic surgeon: Andrey Spearman, MD  Subjective   Feeling well.  No chest pain.  He still feels somewhat short of breath at times.  Denies orthopnea or PND.  Inpatient Medications    Scheduled Meds: . aspirin EC  81 mg Oral Daily  . atorvastatin  80 mg Oral q1800  . digoxin  0.125 mg Oral Daily  . furosemide  40 mg Oral Daily  . insulin aspart  0-5 Units Subcutaneous QHS  . insulin aspart  0-9 Units Subcutaneous TID WC  . sodium chloride flush  3 mL Intravenous Q12H  . sodium chloride flush  3 mL Intravenous Q12H   Continuous Infusions: . sodium chloride    . sodium chloride    . heparin 1,250 Units/hr (05/08/18 2044)   PRN Meds: sodium chloride, sodium chloride, acetaminophen **OR** acetaminophen, ondansetron **OR** ondansetron (ZOFRAN) IV, sodium chloride flush, sodium chloride flush, zolpidem   Vital Signs    Vitals:   05/08/18 0539 05/08/18 1252 05/08/18 1931 05/09/18 0437  BP:  97/73 93/69 100/77  Pulse:  93 96 91  Resp:  20 18 18   Temp:  97.7 F (36.5 C) 97.9 F (36.6 C) 97.7 F (36.5 C)  TempSrc:  Oral Oral Oral  SpO2:  95% 94% 95%  Weight: 76.9 kg   76.4 kg  Height:        Intake/Output Summary (Last 24 hours) at 05/09/2018 0813 Last data filed at 05/09/2018 0339 Gross per 24 hour  Intake 987.1 ml  Output 1850 ml  Net -862.9 ml   Filed Weights   05/07/18 0346 05/08/18 0539 05/09/18 0437  Weight: 76.7 kg 76.9 kg 76.4 kg    Telemetry    Sinus rhythm.  No events.- Personally Reviewed  ECG    N/A - Personally Reviewed  Physical Exam   VS:  BP 100/77 (BP Location: Right Arm)   Pulse 91   Temp 97.7 F (36.5 C) (Oral)   Resp 18   Ht 5\' 10"  (1.778 m)   Wt 76.4 kg Comment: scale c  SpO2 95%   BMI 24.16 kg/m  , BMI Body mass index is 24.16 kg/m. GENERAL:  Well appearing HEENT: Pupils equal round and  reactive, fundi not visualized, oral mucosa unremarkable NECK:  No jugular venous distention, waveform within normal limits, carotid upstroke brisk and symmetric, no bruits, no thyromegaly LYMPHATICS:  No cervical adenopathy LUNGS:  Clear to auscultation bilaterally HEART:  RRR.  PMI not displaced or sustained,S1 and S2 within normal limits, no S3, no S4, no clicks, no rubs, III/VI holosystolic murmur at the apex ABD:  Flat, positive bowel sounds normal in frequency in pitch, no bruits, no rebound, no guarding, no midline pulsatile mass, no hepatomegaly, no splenomegaly EXT:  2 plus pulses throughout, no edema, no cyanosis no clubbing SKIN:  No rashes no nodules NEURO:  Cranial nerves II through XII grossly intact, motor grossly intact throughout [facility]:  Cognitively intact, oriented to person place and time   Labs    Chemistry Recent Labs  Lab 05/03/18 1324  05/05/18 2352 05/07/18 0558 05/08/18 0304  NA 131*   < > 136 133* 133*  K 4.2   < > 4.0 3.8 3.8  CL 97*   < > 99 95* 97*  CO2 21*   < > 27 26 26   GLUCOSE 176*   < > 141* 130* 131*  BUN 9   < > 10 8 7*  CREATININE 1.12   < > 1.02 1.05 1.14  CALCIUM 9.1   < > 8.8* 8.9 8.8*  PROT 6.8  --   --   --   --   ALBUMIN 3.7  --   --   --   --   AST 34  --   --   --   --   ALT 35  --   --   --   --   ALKPHOS 45  --   --   --   --   BILITOT 1.9*  --   --   --   --   GFRNONAA >60   < > >60 >60 >60  GFRAA >60   < > >60 >60 >60  ANIONGAP 13   < > 10 12 10    < > = values in this interval not displayed.     Hematology Recent Labs  Lab 05/07/18 0558 05/08/18 0304 05/09/18 0522  WBC 6.0 5.6 5.9  RBC 4.69 4.46 4.64  HGB 13.7 13.7 13.8  HCT 43.3 40.6 42.3  MCV 92.3 91.0 91.2  MCH 29.2 30.7 29.7  MCHC 31.6 33.7 32.6  RDW 13.4 13.2 13.3  PLT 328 265 258    Cardiac Enzymes Recent Labs  Lab 05/03/18 1706 05/03/18 2217 05/04/18 0433  TROPONINI 0.05* 0.09* 0.07*    Recent Labs  Lab 05/03/18 1328  TROPIPOC 0.09*      BNP Recent Labs  Lab 05/03/18 1324  BNP 2,703.3*     DDimer No results for input(s): DDIMER in the last 168 hours.   Radiology    No results found.  Cardiac Studies   Echo 05/04/18:  Study Conclusions  - Left ventricle: The cavity size was moderately dilated. Systolic   function was severely reduced. The estimated ejection fraction   was in the range of 20% to 25%. Indeterminate diastolic function.   Severe diffuse hypokinesis. - Regional wall motion abnormality: Akinesis of the mid   inferolateral, basal-mid anterolateral, and apical lateral   myocardium. - Aortic valve: Transvalvular velocity was within the normal range.   There was no stenosis. There was no regurgitation. - Mitral valve: Mitral valve leaflet tenting. There was moderate   regurgitation. The PISA quantitation may slightly underestimate   severity, with one jet more central, and one jet eccentric and   anteriorly directed (clip 71). - Left atrium: The atrium was mildly dilated. - Right ventricle: The cavity size was mildly dilated. Wall   thickness was normal. Systolic function was moderately reduced. - Tricuspid valve: There was trivial regurgitation. - Inferior vena cava: The vessel was dilated. The respirophasic   diameter changes were blunted (< 50%), consistent with elevated   central venous pressure. - Pericardium, extracardiac: Pleural effusion.  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  There is severe (4+) mitral regurgitation.  SEVERE MULTIVESSEL DISEASE  Prox RCA to Mid RCA lesion is 90% stenosed. Mid RCA to Dist RCA lesion is 100% stenosed with 100% stenosed side branch in Post Atrio.  Prox LAD lesion is 99% stenosed. Mid LAD-1 lesion is 80% stenosed. Mid LAD-2 lesion is 90%  stenosed. Dist LAD lesion is 85% stenosed.  Prox Cx lesion is 95% stenosed. 2nd Mrg lesion is 90% stenosed with 90% stenosed side branch in  Lat 2nd Mrg.   SUMMARY  Severe multivessel CAD: Diffuse proximal RCA disease with mid 100% CTO filling with bridging collaterals and right-to-left collaterals, separate ostium circumflex with proximal 95% stenosis and bifurcation 90% stenosis of OM1, 99% mid LAD after very large septal perforator trunk (perfusing PDA) with bridging collaterals filling mid to distal LAD with a large diagonal branch and diffuse disease throughout the distal LAD.  Moderate pulmonary hypertension likely secondary to pulmonary venous congestion and mitral regurgitation.  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  Cardiac MRI 05/06/18: IMPRESSION: 1. Severely dilated left ventricle with normal thickness and severely decreased systolic function (LVEF = 21%). There is global hypokinesis and akinesis in the basal and mid inferolateral, anterolateral and apical lateral and inferior walls.  There is late gadolinium enhancement in the basal and mid inferolateral and apical lateral, inferior walls (75-100%, poor chance of recovery if revascularized) and mid inferior and anterolateral walls (25-50%, good chance of recovery if revascularized).  2. Mildly dilated right ventricle with moderately to severely decreased systolic function (LVEF = 25%). There is diffuse hypokinesis.  3. Moderately dilated left atrium and mildly dilated right atrium.  4. Severe mitral and mild tricuspid regurgitation.  5. Trivial pericardial effusion.  Myocardium viable in 12 out of 17 left ventricular segments. Patient Profile     67  y.o. male with hypertension, hyperlipidemia, tobacco abuse, and family history of premature CAD here with shortness of breath and found to have severe systolic dysfunction and moderate to severe mitral regurgitation.  He underwent left and right heart catheterization that revealed three-vessel CAD.  Myocardial viability study revealed viability in 12 out of 17 segments.  Assessment & Plan    # Acute systolic and diastolic heart failure: LVEF newly diagnosed at 20 to 25% this admission.  Mr. Gamblin appears to be euvolemic on exam.  Weight is down 0.5kg in the last 24 hours.  He is net -1.8L in the last 24 hours.  Continue digoxin and Lasix.  He has been hypotensive so he is not on a beta-blocker or Entresto.  A BMP has not been drawn today.  Will add back daily BMP given that he is being diuresed and his renal function was increasing.  We will check a digoxin level given that he is received 5 doses.  # 3 vessel CAD:  # Hyperlipidemia: He was found to have severe three-vessel disease on cardiac cath this admission.  Troponin was mildly elevated to a peak of 0.09.  This is most consistent with demand ischemia.  Continue heparin.  He is being evaluated by cardiothoracic surgery for likely CABG this week.  Will await further recommendations.  Continue aspirin.  No beta-blocker due to hypotension as above.  # Moderate to severe MR: Likely ischemic.  Will need mitral valve replacement/repair the time of surgery.  # DM:  Hemoglobin A1c 7.1%.  Needs SGLT2 inhibitor as an outpatient.  # Tobacco abuse: Patient does not need nicotine or placement patch.  He was down to 2 cigarettes before admission.  Encouraged continued cessation.       For questions or updates, please contact [facility] Please consult [email or web address] for contact info under        Signed, Chilton Si, MD  05/09/2018, 8:13 AM

## 2018-05-10 ENCOUNTER — Inpatient Hospital Stay (HOSPITAL_COMMUNITY): Payer: Medicare HMO

## 2018-05-10 DIAGNOSIS — I5041 Acute combined systolic (congestive) and diastolic (congestive) heart failure: Secondary | ICD-10-CM

## 2018-05-10 LAB — CBC
HCT: 44.7 % (ref 39.0–52.0)
Hemoglobin: 14.7 g/dL (ref 13.0–17.0)
MCH: 30.2 pg (ref 26.0–34.0)
MCHC: 32.9 g/dL (ref 30.0–36.0)
MCV: 92 fL (ref 80.0–100.0)
Platelets: 257 10*3/uL (ref 150–400)
RBC: 4.86 MIL/uL (ref 4.22–5.81)
RDW: 13.4 % (ref 11.5–15.5)
WBC: 5.3 10*3/uL (ref 4.0–10.5)
nRBC: 0 % (ref 0.0–0.2)

## 2018-05-10 LAB — BASIC METABOLIC PANEL
ANION GAP: 11 (ref 5–15)
BUN: 7 mg/dL — ABNORMAL LOW (ref 8–23)
CALCIUM: 9.1 mg/dL (ref 8.9–10.3)
CO2: 25 mmol/L (ref 22–32)
Chloride: 100 mmol/L (ref 98–111)
Creatinine, Ser: 1.04 mg/dL (ref 0.61–1.24)
GFR calc non Af Amer: >60 mL/min (ref >60)
GLUCOSE: 126 mg/dL — AB (ref 70–99)
POTASSIUM: 4.1 mmol/L (ref 3.5–5.1)
Sodium: 136 mmol/L (ref 135–145)

## 2018-05-10 LAB — DIGOXIN LEVEL: Digoxin Level: 0.3 ng/mL — ABNORMAL LOW (ref 0.8–2.0)

## 2018-05-10 LAB — HEPARIN LEVEL (UNFRACTIONATED): Heparin Unfractionated: 0.24 IU/mL — ABNORMAL LOW (ref 0.30–0.70)

## 2018-05-10 LAB — GLUCOSE, CAPILLARY
Glucose-Capillary: 124 mg/dL — ABNORMAL HIGH (ref 70–99)
Glucose-Capillary: 148 mg/dL — ABNORMAL HIGH (ref 70–99)
Glucose-Capillary: 157 mg/dL — ABNORMAL HIGH (ref 70–99)
Glucose-Capillary: 125 mg/dL — ABNORMAL HIGH (ref 70–99)

## 2018-05-10 NOTE — Progress Notes (Signed)
PROGRESS NOTE   Michael Banks  ZOX:[id number]    DOB: [birth year]    DOA: 05/03/2018  PCP: Patient, No Pcp Per   Brief Narrative:  67 year old male with PMH of HTN, HLD, tobacco abuse, no known CAD, presented to ED on 05/03/2018 with 3 to 4 weeks history of DOE, orthopnea, PND, generalized fatigue and weakness and abdominal bloating.  He reports couple of episodes of left arm pain with exertion about 4 weeks ago but no recurrence since.  Strong family history of early cardiac death in his brother.  Admitted for acute systolic CHF, elevated troponin and new diagnosed cardiomyopathy.  Patient underwent cardiac catheterization on 05/05/2018.  Cardiac catheter revealed multivessel disease.  CABG with MV repair planned for 11/22.  Input from cardiology team and cardiothoracic team is highly appreciated.  Cardiology assumed complete care 11/19 and TRH signed off.  Assessment & Plan:   Active Problems:   Pulmonary edema   Demand ischemia (HCC)   New onset of congestive heart failure (HCC)   Dilated cardiomyopathy (HCC)   Mitral valve insufficiency   Coronary artery disease involving native heart with angina pectoris (HCC)   Acute systolic & diastolic CHF:  Cardiology was consulted.  Started on IV Lasix.  -8.5 L since admission.  ACEI/ARB not started due to soft blood pressures and beta-blockers not started due to acute CHF.  TTE 11/13: LVEF 20-25% with severe diffuse hypokinesis.  Cardiac catheterization revealed multivessel disease.  Now on oral digoxin 0.125 mg daily and oral Lasix 40 mg daily.  Clinically improved.  Mild intermittent orthopnea.  Reported mild productive cough and hence chest x-ray was checked today and shows mild pulmonary edema.?  Change back to IV Lasix for a day or 2, defer to Cardiology.  Dr. Duke Salviaandolph discussed with me and kindly accepted total care of patient on Cardiology service.  TRH signed off 11/19.  Please reconsult TRH for any further assistance.  CAD  (three-vessel)/ischemic cardiomyopathy:  Echo revealed EF of 20 to 25%.  Continue to optimize. Patient is currently on heparin drip, digoxin, Lasix. Coreg and ACE inhibitor or ARB will be added when blood pressure permits. Three-vessel CAD on cardiac cath this admission. TCTS follow-up appreciated and plan CABG and MV repair/replacement 11/22.  Moderate to severe MR Suspect ischemic in nature.  Management as indicated above.  Elevated troponin:  Flat trend.  TTE results as above.  Cardiac cath as documented above.  Suspect due CAD and demand ischemia.  Essential hypertension:  Has been off of medications PTA.  Soft blood pressures while being diuresed now.  Blood pressure stable.  Hyperlipidemia:  Continue statins.  Type II DM:  A1c 7.1.  SSI here.  Diet controlled at home.  Reasonable inpatient control.  Consider oral agents (metformin or SGLT 2 inhibitor) at discharge.  Tobacco abuse Cessation counseled.  Declines nicotine patch.   DVT prophylaxis: SCDs and IV heparin drip. Code Status: Full Family Communication: None at bedside Disposition: Pending upcoming CABG.  Consultants:  Cardiology Cardiothoracic surgery.  Procedures:  Cardiac cath.  Antimicrobials:  None   Subjective: Mild intermittent orthopnea.  Mild dyspnea on exertion.  No leg edema.  Reported productive cough to Cardiology.  No chest pain, dizziness or lightheadedness.   Objective:  Vitals:   05/09/18 1943 05/10/18 0429 05/10/18 0901 05/10/18 1338  BP: 90/73 102/77  92/66  Pulse: 94 91 88 92  Resp: 18 18  18   Temp: 98 F (36.7 C) 97.8 F (36.6 C)  (!) 97.3  F (36.3 C)  TempSrc: Oral Oral  Oral  SpO2: 97% 91%  98%  Weight:  76.2 kg    Height:        Examination:  General exam: Pleasant young male, moderately built and nourished lying comfortably propped up in bed Respiratory system: Occasional fine basal crackles but otherwise clear to auscultation. Respiratory effort  normal. Cardiovascular system: S1 & S2 heard, RRR. No JVD, murmurs, rubs, gallops or clicks. No pedal edema.  Telemetry personally reviewed: Sinus rhythm with BBB morphology Gastrointestinal system: Abdomen is nondistended, soft and nontender. No organomegaly or masses felt. Normal bowel sounds heard.  Stable Central nervous system: Alert and oriented. No focal neurological deficits.  Stable Extremities: Symmetric 5 x 5 power. Skin: No rashes, lesions or ulcers Psychiatry: Judgement and insight appear normal. Mood & affect appropriate.     Data Reviewed: I have personally reviewed following labs and imaging studies  CBC: Recent Labs  Lab 05/05/18 2352 05/07/18 0558 05/08/18 0304 05/09/18 0522 05/10/18 0433  WBC 7.4 6.0 5.6 5.9 5.3  HGB 14.0 13.7 13.7 13.8 14.7  HCT 43.6 43.3 40.6 42.3 44.7  MCV 92.6 92.3 91.0 91.2 92.0  PLT 350 328 265 258 257   Basic Metabolic Panel: Recent Labs  Lab 05/05/18 2352 05/07/18 0558 05/08/18 0304 05/09/18 1009 05/10/18 0433  NA 136 133* 133* 133* 136  K 4.0 3.8 3.8 4.5 4.1  CL 99 95* 97* 100 100  CO2 27 26 26 25 25   GLUCOSE 141* 130* 131* 202* 126*  BUN 10 8 7* 6* 7*  CREATININE 1.02 1.05 1.14 1.08 1.04  CALCIUM 8.8* 8.9 8.8* 8.6* 9.1   Cardiac Enzymes: Recent Labs  Lab 05/03/18 1706 05/03/18 2217 05/04/18 0433  TROPONINI 0.05* 0.09* 0.07*   HbA1C: No results for input(s): HGBA1C in the last 72 hours. CBG: Recent Labs  Lab 05/09/18 1228 05/09/18 1626 05/09/18 2110 05/10/18 0739 05/10/18 1202  GLUCAP 171* 125* 122* 124* 148*    No results found for this or any previous visit (from the past 240 hour(s)).       Radiology Studies: Dg Chest Port 1 View  Result Date: 05/10/2018 CLINICAL DATA:  Productive cough. EXAM: PORTABLE CHEST 1 VIEW COMPARISON:  Chest x-ray 05/03/2018. FINDINGS: Cardiomegaly with bilateral mild interstitial prominence and small right pleural effusion noted. Findings suggest mild CHF. Findings  improved from 05/03/2018. No pneumothorax. No acute bony abnormality. IMPRESSION: Cardiomegaly with mild bilateral interstitial edema and small right pleural effusion. Findings improved from prior study of 05/03/2018. Electronically Signed   By: Maisie Fus  Register   On: 05/10/2018 11:23        Scheduled Meds: . aspirin EC  81 mg Oral Daily  . atorvastatin  80 mg Oral q1800  . digoxin  0.125 mg Oral Daily  . furosemide  40 mg Oral Daily  . insulin aspart  0-5 Units Subcutaneous QHS  . insulin aspart  0-9 Units Subcutaneous TID WC  . sodium chloride flush  3 mL Intravenous Q12H  . sodium chloride flush  3 mL Intravenous Q12H   Continuous Infusions: . sodium chloride    . sodium chloride    . heparin 1,350 Units/hr (05/10/18 1352)     LOS: 7 days     Marcellus Scott, MD, FACP, [facility]. [facility] Pager [phone]  If 7PM-7AM, please contact night-coverage [email or web address] Password [email or web address] 05/10/2018, 3:07 PM

## 2018-05-10 NOTE — Progress Notes (Signed)
ANTICOAGULATION CONSULT NOTE  Pharmacy Consult for Heparin Indication: chest pain/ACS  No Known Allergies  Patient Measurements: Height: 5\' 10"  (177.8 cm) Weight: 168 lb 1.6 oz (76.2 kg)(scale c) IBW/kg (Calculated) : 73 Heparin Dosing Weight: 79.4   Vital Signs: Temp: 97.8 F (36.6 C) (11/19 0429) Temp Source: Oral (11/19 0429) BP: 102/77 (11/19 0429) Pulse Rate: 88 (11/19 0901)  Labs: Recent Labs    05/08/18 0304 05/08/18 1115 05/09/18 0522 05/09/18 1009 05/10/18 0433  HGB 13.7  --  13.8  --  14.7  HCT 40.6  --  42.3  --  44.7  PLT 265  --  258  --  257  HEPARINUNFRC 0.51 0.49 0.42  --  0.24*  CREATININE 1.14  --   --  1.08 1.04    Estimated Creatinine Clearance: 74.1 mL/min (by C-G formula based on SCr of 1.04 mg/dL).   Assessment: Patient is [age] yoM presenting with shortness of breath and left arm pain with elevated troponin but trend was flat. Pharmacy consulted for heparin dosing. No anticoagulation PTA. Tentative plan is for CABG next week.  Heparin level 0.24 units/ml  Goal of Therapy:  Heparin level 0.3-0.7 units/ml Monitor platelets by anticoagulation protocol: Yes   Plan:  Increase heparin to 1400 units / hr Daily heparin level and CBC while on heparin Monitor for s/sx of bleeding F/u final plans for CABG next week Thanks for allowing pharmacy to be a part of this patient's care.  Okey RegalLisa Itati Brocksmith, PharmD Clinical Pharmacist  05/10/2018   9:46 AM

## 2018-05-10 NOTE — Progress Notes (Signed)
CARDIAC REHAB PHASE I   PRE:  Rate/Rhythm: 96 SR  BP:  Supine:   Sitting: 102/80  Standing:    SaO2: 96%RA  MODE:  Ambulation: 470 ft   POST:  Rate/Rhythm: 99 SR  BP:  Supine:   Sitting: 89/66,82/71  Then lying at 94/74  Standing:    SaO2: 96%RA 1010-1100 Pt walked 470 ft on RA with minimal asst. No CP or dizziness. BP lower after walk but improved with lying down. Gave pt OHS booklet, care guide, in the tube handout and wrote down how to view pre op video. Discussed with pt importance of mobility and IS after surgery. Gave IS and pt able to demonstrate 1000 ml correctly. Pt stated he had gotten to two cigarettes a day and will have no problem quitting now. Discussed with pt that surgeon normally likes for pts to have someone with them first week discharged. Pt stated he will check with his brother to see if he can do it.  Discussed staying in the tube and sternal precautions.   Luetta Nuttingharlene Hanad Leino, RN BSN  05/10/2018 10:55 AM

## 2018-05-10 NOTE — Progress Notes (Signed)
ANTICOAGULATION CONSULT NOTE  Pharmacy Consult for Heparin Indication: chest pain/ACS  No Known Allergies  Patient Measurements: Height: 5\' 10"  (177.8 cm) Weight: 168 lb 1.6 oz (76.2 kg)(scale c) IBW/kg (Calculated) : 73 Heparin Dosing Weight: 79.4   Vital Signs: Temp: 97.8 F (36.6 C) (11/19 0429) Temp Source: Oral (11/19 0429) BP: 102/77 (11/19 0429) Pulse Rate: 91 (11/19 0429)  Labs: Recent Labs    05/08/18 0304 05/08/18 1115 05/09/18 0522 05/09/18 1009 05/10/18 0433  HGB 13.7  --  13.8  --  14.7  HCT 40.6  --  42.3  --  44.7  PLT 265  --  258  --  257  HEPARINUNFRC 0.51 0.49 0.42  --  0.24*  CREATININE 1.14  --   --  1.08 1.04    Estimated Creatinine Clearance: 74.1 mL/min (by C-G formula based on SCr of 1.04 mg/dL).   Assessment: Patient is [age] yoM presenting with shortness of breath and left arm pain with elevated troponin but trend was flat. Pharmacy consulted for heparin dosing. No anticoagulation PTA. Tentative plan is for CABG next week.  Heparin level 0.24 units/ml  Goal of Therapy:  Heparin level 0.3-0.7 units/ml Monitor platelets by anticoagulation protocol: Yes   Plan:  Increase heparin to 1350 units / hr Check heparin level in 6 hours Daily heparin level and CBC while on heparin Monitor for s/sx of bleeding F/u final plans for CABG next week Thanks for allowing pharmacy to be a part of this patient's care.  Talbert CageLora Jeannene Tschetter, PharmD Clinical Pharmacist  05/10/2018   5:43 AM

## 2018-05-10 NOTE — Plan of Care (Signed)
  Problem: Activity: Goal: Risk for activity intolerance will decrease 05/10/2018 0617 by Kary Kosoberts, Eamonn Sermeno D, RN Outcome: Progressing 05/10/2018 0611 by Kary Kosoberts, Krishay Faro D, RN Outcome: Progressing   Problem: Nutrition: Goal: Adequate nutrition will be maintained Outcome: Progressing   Problem: Coping: Goal: Level of anxiety will decrease Outcome: Progressing   Problem: Elimination: Goal: Will not experience complications related to urinary retention Outcome: Progressing   Problem: Pain Managment: Goal: General experience of comfort will improve Outcome: Progressing   Problem: Safety: Goal: Ability to remain free from injury will improve Outcome: Progressing   Problem: Skin Integrity: Goal: Risk for impaired skin integrity will decrease Outcome: Progressing

## 2018-05-10 NOTE — Progress Notes (Signed)
Progress Note  Patient Name: Michael Banks Date of Encounter: 05/10/2018  Primary Cardiologist: Tobias Alexander, MD  Cardiothoracic surgeon: Andrey Spearman, MD  Subjective   Feeling well.  Anxious about surgery.  Denies chest pain or shortness of breath.  Mild orthopnea.  Occasionally productive cough.  Inpatient Medications    Scheduled Meds: . aspirin EC  81 mg Oral Daily  . atorvastatin  80 mg Oral q1800  . digoxin  0.125 mg Oral Daily  . furosemide  40 mg Oral Daily  . insulin aspart  0-5 Units Subcutaneous QHS  . insulin aspart  0-9 Units Subcutaneous TID WC  . sodium chloride flush  3 mL Intravenous Q12H  . sodium chloride flush  3 mL Intravenous Q12H   Continuous Infusions: . sodium chloride    . sodium chloride    . heparin 1,350 Units/hr (05/10/18 0602)   PRN Meds: sodium chloride, sodium chloride, acetaminophen **OR** acetaminophen, ondansetron **OR** ondansetron (ZOFRAN) IV, sodium chloride flush, sodium chloride flush, zolpidem   Vital Signs    Vitals:   05/09/18 1257 05/09/18 1943 05/10/18 0429 05/10/18 0901  BP: 100/71 90/73 102/77   Pulse: 91 94 91 88  Resp: 20 18 18    Temp: 98.5 F (36.9 C) 98 F (36.7 C) 97.8 F (36.6 C)   TempSrc: Oral Oral Oral   SpO2: 98% 97% 91%   Weight:   76.2 kg   Height:        Intake/Output Summary (Last 24 hours) at 05/10/2018 1046 Last data filed at 05/10/2018 0752 Gross per 24 hour  Intake 600 ml  Output 1625 ml  Net -1025 ml   Filed Weights   05/08/18 0539 05/09/18 0437 05/10/18 0429  Weight: 76.9 kg 76.4 kg 76.2 kg    Telemetry    Sinus rhythm.  No events.- Personally Reviewed  ECG    N/A - Personally Reviewed  Physical Exam   VS:  BP 102/77 (BP Location: Right Arm)   Pulse 88   Temp 97.8 F (36.6 C) (Oral)   Resp 18   Ht 5\' 10"  (1.778 m)   Wt 76.2 kg Comment: scale c  SpO2 91%   BMI 24.12 kg/m  , BMI Body mass index is 24.12 kg/m. GENERAL:  Well appearing HEENT: Pupils equal  round and reactive, fundi not visualized, oral mucosa unremarkable NECK:  No jugular venous distention, waveform within normal limits, carotid upstroke brisk and symmetric, no bruits LUNGS:  Clear to auscultation bilaterally HEART:  RRR.  PMI not displaced or sustained,S1 and S2 within normal limits, no S3, no S4, no clicks, no rubs, III/VI holosystolic murmur at the apex. ABD:  Flat, positive bowel sounds normal in frequency in pitch, no bruits, no rebound, no guarding, no midline pulsatile mass, no hepatomegaly, no splenomegaly EXT:  2 plus pulses throughout, no edema, no cyanosis no clubbing SKIN:  No rashes no nodules NEURO:  Cranial nerves II through XII grossly intact, motor grossly intact throughout [facility]:  Cognitively intact, oriented to person place and time   Labs    Chemistry Recent Labs  Lab 05/03/18 1324  05/08/18 0304 05/09/18 1009 05/10/18 0433  NA 131*   < > 133* 133* 136  K 4.2   < > 3.8 4.5 4.1  CL 97*   < > 97* 100 100  CO2 21*   < > 26 25 25   GLUCOSE 176*   < > 131* 202* 126*  BUN 9   < > 7* 6*  7*  CREATININE 1.12   < > 1.14 1.08 1.04  CALCIUM 9.1   < > 8.8* 8.6* 9.1  PROT 6.8  --   --   --   --   ALBUMIN 3.7  --   --   --   --   AST 34  --   --   --   --   ALT 35  --   --   --   --   ALKPHOS 45  --   --   --   --   BILITOT 1.9*  --   --   --   --   GFRNONAA >60   < > >60 >60 >60  GFRAA >60   < > >60 >60 >60  ANIONGAP 13   < > 10 8 11    < > = values in this interval not displayed.     Hematology Recent Labs  Lab 05/08/18 0304 05/09/18 0522 05/10/18 0433  WBC 5.6 5.9 5.3  RBC 4.46 4.64 4.86  HGB 13.7 13.8 14.7  HCT 40.6 42.3 44.7  MCV 91.0 91.2 92.0  MCH 30.7 29.7 30.2  MCHC 33.7 32.6 32.9  RDW 13.2 13.3 13.4  PLT 265 258 257    Cardiac Enzymes Recent Labs  Lab 05/03/18 1706 05/03/18 2217 05/04/18 0433  TROPONINI 0.05* 0.09* 0.07*    Recent Labs  Lab 05/03/18 1328  TROPIPOC 0.09*     BNP Recent Labs  Lab 05/03/18 1324  BNP  2,703.3*     DDimer No results for input(s): DDIMER in the last 168 hours.   Radiology    No results found.  Cardiac Studies   Echo 05/04/18:  Study Conclusions  - Left ventricle: The cavity size was moderately dilated. Systolic   function was severely reduced. The estimated ejection fraction   was in the range of 20% to 25%. Indeterminate diastolic function.   Severe diffuse hypokinesis. - Regional wall motion abnormality: Akinesis of the mid   inferolateral, basal-mid anterolateral, and apical lateral   myocardium. - Aortic valve: Transvalvular velocity was within the normal range.   There was no stenosis. There was no regurgitation. - Mitral valve: Mitral valve leaflet tenting. There was moderate   regurgitation. The PISA quantitation may slightly underestimate   severity, with one jet more central, and one jet eccentric and   anteriorly directed (clip 71). - Left atrium: The atrium was mildly dilated. - Right ventricle: The cavity size was mildly dilated. Wall   thickness was normal. Systolic function was moderately reduced. - Tricuspid valve: There was trivial regurgitation. - Inferior vena cava: The vessel was dilated. The respirophasic   diameter changes were blunted (< 50%), consistent with elevated   central venous pressure. - Pericardium, extracardiac: Pleural effusion.  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  There is severe (4+) mitral regurgitation.  SEVERE MULTIVESSEL DISEASE  Prox RCA to Mid RCA lesion is 90% stenosed. Mid RCA to Dist RCA lesion is 100% stenosed with 100% stenosed side branch in Post Atrio.  Prox LAD lesion is 99% stenosed. Mid LAD-1 lesion is 80% stenosed. Mid LAD-2 lesion is 90% stenosed. Dist LAD lesion is 85% stenosed.  Prox  Cx lesion is 95% stenosed. 2nd Mrg lesion is 90% stenosed with 90% stenosed side branch in Lat 2nd Mrg.   SUMMARY  Severe  multivessel CAD: Diffuse proximal RCA disease with mid 100% CTO filling with bridging collaterals and right-to-left collaterals, separate ostium circumflex with proximal 95% stenosis and bifurcation 90% stenosis of OM1, 99% mid LAD after very large septal perforator trunk (perfusing PDA) with bridging collaterals filling mid to distal LAD with a large diagonal branch and diffuse disease throughout the distal LAD.  Moderate pulmonary hypertension likely secondary to pulmonary venous congestion and mitral regurgitation.  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  Cardiac MRI 05/06/18: IMPRESSION: 1. Severely dilated left ventricle with normal thickness and severely decreased systolic function (LVEF = 21%). There is global hypokinesis and akinesis in the basal and mid inferolateral, anterolateral and apical lateral and inferior walls.  There is late gadolinium enhancement in the basal and mid inferolateral and apical lateral, inferior walls (75-100%, poor chance of recovery if revascularized) and mid inferior and anterolateral walls (25-50%, good chance of recovery if revascularized).  2. Mildly dilated right ventricle with moderately to severely decreased systolic function (LVEF = 25%). There is diffuse hypokinesis.  3. Moderately dilated left atrium and mildly dilated right atrium.  4. Severe mitral and mild tricuspid regurgitation.  5. Trivial pericardial effusion.  Myocardium viable in 12 out of 17 left ventricular segments. Patient Profile     67 y.o. male with hypertension, hyperlipidemia,  tobacco abuse, and family history of premature CAD here with shortness of breath and found to have severe systolic dysfunction and moderate to severe mitral regurgitation.  He underwent left and right heart catheterization that revealed three-vessel CAD.  Myocardial viability study revealed viability in 12 out of 17 segments.  Assessment & Plan    # Acute systolic and diastolic heart failure: LVEF newly diagnosed at 20 to 25% this admission.  Mr. Excell SeltzerCooper appears to be euvolemic on exam though he reports mild orthopnea.  Will check CXR as he also has a mildly productive cough.  There was no leukocytosis on his CBC today.  Weight stable.  Continue digoxin and Lasix.  Digoxin level was subtherapeutic.  No need to titrate at this time as he appears to be compensated.  He has been hypotensive so he is not on a beta-blocker or Entresto.   # 3 vessel CAD:  # Hyperlipidemia: He was found to have severe three-vessel disease on cardiac cath this admission.  Troponin was mildly elevated to a peak of 0.09.  This is most consistent with demand ischemia.  Continue heparin.  He is being evaluated by cardiothoracic surgery and will go for CABG on Friday.  Continue aspirin and heparin.  No beta-blocker due to hypotension as above.  # Moderate to severe MR: Likely ischemic.  Will need mitral valve replacement/repair the time of surgery.  # DM:  Hemoglobin A1c 7.1%.  Needs SGLT2 inhibitor as an outpatient.  # Tobacco abuse: Patient does not need nicotine or placement patch.  He was down to 2 cigarettes before admission.  Encouraged continued cessation.   We will take over primary care for this patient.  Appreciate the hospitalist team.      For questions or updates, please contact [facility] Please consult [email or web address] for contact info under        Signed, Chilton Siiffany Brent, MD  05/10/2018, 10:46 AM

## 2018-05-11 LAB — BASIC METABOLIC PANEL
ANION GAP: 13 (ref 5–15)
BUN: 5 mg/dL — ABNORMAL LOW (ref 8–23)
CALCIUM: 8.5 mg/dL — AB (ref 8.9–10.3)
CO2: 19 mmol/L — AB (ref 22–32)
Chloride: 102 mmol/L (ref 98–111)
Creatinine, Ser: 1.01 mg/dL (ref 0.61–1.24)
GFR calc Af Amer: >60 mL/min (ref >60)
GFR calc non Af Amer: >60 mL/min (ref >60)
GLUCOSE: 116 mg/dL — AB (ref 70–99)
POTASSIUM: 3.8 mmol/L (ref 3.5–5.1)
Sodium: 134 mmol/L — ABNORMAL LOW (ref 135–145)

## 2018-05-11 LAB — CBC
HCT: 39.4 % (ref 39.0–52.0)
Hemoglobin: 12.7 g/dL — ABNORMAL LOW (ref 13.0–17.0)
MCH: 29.6 pg (ref 26.0–34.0)
MCHC: 32.2 g/dL (ref 30.0–36.0)
MCV: 91.8 fL (ref 80.0–100.0)
Platelets: 222 10*3/uL (ref 150–400)
RBC: 4.29 MIL/uL (ref 4.22–5.81)
RDW: 13.4 % (ref 11.5–15.5)
WBC: 5.7 10*3/uL (ref 4.0–10.5)
nRBC: 0 % (ref 0.0–0.2)

## 2018-05-11 LAB — GLUCOSE, CAPILLARY
GLUCOSE-CAPILLARY: 136 mg/dL — AB (ref 70–99)
GLUCOSE-CAPILLARY: 139 mg/dL — AB (ref 70–99)
Glucose-Capillary: 130 mg/dL — ABNORMAL HIGH (ref 70–99)
Glucose-Capillary: 164 mg/dL — ABNORMAL HIGH (ref 70–99)

## 2018-05-11 LAB — HEPARIN LEVEL (UNFRACTIONATED): Heparin Unfractionated: 0.55 IU/mL (ref 0.30–0.70)

## 2018-05-11 MED ORDER — BISACODYL 5 MG PO TBEC
5.0000 mg | DELAYED_RELEASE_TABLET | Freq: Every day | ORAL | Status: DC | PRN
  Administered 2018-05-11: 5 mg via ORAL
  Filled 2018-05-11: qty 1

## 2018-05-11 NOTE — Progress Notes (Signed)
[id number] Offered to walk with pt who declined. Stated he wants to take a nap. Encouraged IS. Encouraged pt to watch pre op video. Stated he would try to watch today. Asked if any questions regarding pre op ed and pt stated none at this time. Will continue to follow. Luetta NuttingCharlene Jasnoor Trussell RN BSN 05/11/2018 12:04 PM

## 2018-05-11 NOTE — Progress Notes (Signed)
ANTICOAGULATION CONSULT NOTE  Pharmacy Consult for Heparin Indication: chest pain/ACS  No Known Allergies  Patient Measurements: Height: 5\' 10"  (177.8 cm) Weight: 167 lb 14.4 oz (76.2 kg) IBW/kg (Calculated) : 73 Heparin Dosing Weight: 79.4   Vital Signs: Temp: 98.6 F (37 C) (11/20 0555) Temp Source: Oral (11/20 0555) BP: 98/76 (11/20 0555) Pulse Rate: 86 (11/20 0555)  Labs: Recent Labs    05/09/18 0522 05/09/18 1009 05/10/18 0433 05/11/18 0356  HGB 13.8  --  14.7 12.7*  HCT 42.3  --  44.7 39.4  PLT 258  --  257 222  HEPARINUNFRC 0.42  --  0.24* 0.55  CREATININE  --  1.08 1.04 1.01    Estimated Creatinine Clearance: 76.3 mL/min (by C-G formula based on SCr of 1.01 mg/dL).   Assessment: Patient is [age] yoM presenting with shortness of breath and left arm pain with elevated troponin but trend was flat. Pharmacy consulted for heparin dosing. No anticoagulation PTA. Tentative plan is for CABG Friday 11/22  Heparin level therapetuic  Goal of Therapy:  Heparin level 0.3-0.7 units/ml Monitor platelets by anticoagulation protocol: Yes   Plan:  Continue heparin at 1400 units / hr Daily heparin level and CBC while on heparin Monitor for s/sx of bleeding  Thanks for allowing pharmacy to be a part of this patient's care.  Okey RegalLisa Gleason Ardoin, PharmD Clinical Pharmacist  05/11/2018   7:24 AM

## 2018-05-11 NOTE — Progress Notes (Signed)
Progress Note  Patient Name: Michael Banks Date of Encounter: 05/11/2018  Primary Cardiologist: Tobias AlexanderKatarina Nelson, MD  Cardiothoracic surgeon: Andrey SpearmanSteve Hendrickson, MD  Subjective   Feeling well.  Notes that he lives alone and thinks that he will need rehab after surgery.   Inpatient Medications    Scheduled Meds: . aspirin EC  81 mg Oral Daily  . atorvastatin  80 mg Oral q1800  . digoxin  0.125 mg Oral Daily  . furosemide  40 mg Oral Daily  . insulin aspart  0-5 Units Subcutaneous QHS  . insulin aspart  0-9 Units Subcutaneous TID WC  . sodium chloride flush  3 mL Intravenous Q12H  . sodium chloride flush  3 mL Intravenous Q12H   Continuous Infusions: . sodium chloride    . sodium chloride    . heparin 1,350 Units/hr (05/10/18 1352)   PRN Meds: sodium chloride, sodium chloride, acetaminophen **OR** acetaminophen, ondansetron **OR** ondansetron (ZOFRAN) IV, sodium chloride flush, sodium chloride flush, zolpidem   Vital Signs    Vitals:   05/10/18 1338 05/10/18 2008 05/11/18 0042 05/11/18 0555  BP: 92/66 91/67 102/70 98/76  Pulse: 92 93 93 86  Resp: 18  18 20   Temp: (!) 97.3 F (36.3 C) (!) 97.5 F (36.4 C) 97.7 F (36.5 C) 98.6 F (37 C)  TempSrc: Oral Oral Oral Oral  SpO2: 98% 97% 98% 96%  Weight:   76.2 kg   Height:        Intake/Output Summary (Last 24 hours) at 05/11/2018 0932 Last data filed at 05/11/2018 0910 Gross per 24 hour  Intake 240 ml  Output 1100 ml  Net -860 ml   Filed Weights   05/09/18 0437 05/10/18 0429 05/11/18 0042  Weight: 76.4 kg 76.2 kg 76.2 kg    Telemetry    Sinus rhythm.  No events.- Personally Reviewed  ECG    N/A - Personally Reviewed  Physical Exam   VS:  BP 98/76 (BP Location: Left Arm)   Pulse 86   Temp 98.6 F (37 C) (Oral)   Resp 20   Ht 5\' 10"  (1.778 m)   Wt 76.2 kg   SpO2 96%   BMI 24.09 kg/m  , BMI Body mass index is 24.09 kg/m. GENERAL:  Well appearing HEENT: Pupils equal round and reactive, fundi  not visualized, oral mucosa unremarkable NECK:  No jugular venous distention, waveform within normal limits, carotid upstroke brisk and symmetric, no bruits LUNGS:  Clear to auscultation bilaterally HEART:  RRR.  PMI not displaced or sustained,S1 and S2 within normal limits, no S3, no S4, no clicks, no rubs, III/VI holosystolic murmur at the apex. ABD:  Flat, positive bowel sounds normal in frequency in pitch, no bruits, no rebound, no guarding, no midline pulsatile mass, no hepatomegaly, no splenomegaly EXT:  2 plus pulses throughout, no edema, no cyanosis no clubbing SKIN:  No rashes no nodules NEURO:  Cranial nerves II through XII grossly intact, motor grossly intact [facility]  Cognitively intact, oriented to person place and time   Labs    Chemistry Recent Labs  Lab 05/09/18 1009 05/10/18 0433 05/11/18 0356  NA 133* 136 134*  K 4.5 4.1 3.8  CL 100 100 102  CO2 25 25 19*  GLUCOSE 202* 126* 116*  BUN 6* 7* 5*  CREATININE 1.08 1.04 1.01  CALCIUM 8.6* 9.1 8.5*  GFRNONAA >60 >60 >60  GFRAA >60 >60 >60  ANIONGAP 8 11 13      Hematology Recent Labs  Lab 05/09/18 0522 05/10/18 0433 05/11/18 0356  WBC 5.9 5.3 5.7  RBC 4.64 4.86 4.29  HGB 13.8 14.7 12.7*  HCT 42.3 44.7 39.4  MCV 91.2 92.0 91.8  MCH 29.7 30.2 29.6  MCHC 32.6 32.9 32.2  RDW 13.3 13.4 13.4  PLT 258 257 222    Cardiac Enzymes No results for input(s): TROPONINI in the last 168 hours.  No results for input(s): TROPIPOC in the last 168 hours.   BNP No results for input(s): BNP, PROBNP in the last 168 hours.   DDimer No results for input(s): DDIMER in the last 168 hours.   Radiology    Dg Chest Port 1 View  Result Date: 05/10/2018 CLINICAL DATA:  Productive cough. EXAM: PORTABLE CHEST 1 VIEW COMPARISON:  Chest x-ray 05/03/2018. FINDINGS: Cardiomegaly with bilateral mild interstitial prominence and small right pleural effusion noted. Findings suggest mild CHF. Findings improved from 05/03/2018.  No pneumothorax. No acute bony abnormality. IMPRESSION: Cardiomegaly with mild bilateral interstitial edema and small right pleural effusion. Findings improved from prior study of 05/03/2018. Electronically Signed   By: Maisie Fus  Register   On: 05/10/2018 11:23    Cardiac Studies   Echo 05/04/18:  Study Conclusions  - Left ventricle: The cavity size was moderately dilated. Systolic   function was severely reduced. The estimated ejection fraction   was in the range of 20% to 25%. Indeterminate diastolic function.   Severe diffuse hypokinesis. - Regional wall motion abnormality: Akinesis of the mid   inferolateral, basal-mid anterolateral, and apical lateral   myocardium. - Aortic valve: Transvalvular velocity was within the normal range.   There was no stenosis. There was no regurgitation. - Mitral valve: Mitral valve leaflet tenting. There was moderate   regurgitation. The PISA quantitation may slightly underestimate   severity, with one jet more central, and one jet eccentric and   anteriorly directed (clip 71). - Left atrium: The atrium was mildly dilated. - Right ventricle: The cavity size was mildly dilated. Wall   thickness was normal. Systolic function was moderately reduced. - Tricuspid valve: There was trivial regurgitation. - Inferior vena cava: The vessel was dilated. The respirophasic   diameter changes were blunted (< 50%), consistent with elevated   central venous pressure. - Pericardium, extracardiac: Pleural effusion.  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  There is severe (4+) mitral regurgitation.  SEVERE MULTIVESSEL DISEASE  Prox RCA to Mid RCA lesion is 90% stenosed. Mid RCA to Dist RCA lesion is 100% stenosed with 100% stenosed side branch in Post  Atrio.  Prox LAD lesion is 99% stenosed. Mid LAD-1 lesion is 80% stenosed. Mid LAD-2 lesion is 90% stenosed. Dist LAD lesion is 85% stenosed.  Prox Cx lesion is 95% stenosed. 2nd Mrg lesion is 90% stenosed with 90% stenosed side branch in Lat 2nd Mrg.   SUMMARY  Severe multivessel CAD: Diffuse proximal RCA disease with mid 100% CTO filling with bridging collaterals and right-to-left collaterals, separate ostium circumflex with proximal 95% stenosis and bifurcation 90% stenosis of OM1, 99% mid LAD after very large septal perforator trunk (perfusing PDA) with bridging collaterals filling mid to distal LAD with a large diagonal branch and diffuse disease throughout the distal LAD.  Moderate pulmonary hypertension likely secondary to pulmonary venous congestion and mitral regurgitation.  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  Cardiac MRI 05/06/18: IMPRESSION: 1. Severely dilated left ventricle with normal thickness and severely decreased systolic function (LVEF = 21%). There is global hypokinesis and akinesis in the basal and mid inferolateral, anterolateral and apical lateral and inferior walls.  There is late gadolinium enhancement in the basal and mid inferolateral and apical lateral, inferior walls (75-100%, poor chance of recovery if revascularized) and mid inferior and anterolateral walls (25-50%, good chance of recovery if revascularized).  2. Mildly dilated right ventricle with moderately to severely decreased systolic function (LVEF = 25%). There is diffuse hypokinesis.  3. Moderately dilated left atrium and mildly dilated right atrium.  4. Severe mitral and mild tricuspid regurgitation.  5. Trivial pericardial  effusion.  Myocardium viable in 12 out of 17 left ventricular segments. Patient Profile     67 y.o. male with hypertension, hyperlipidemia, tobacco abuse, and family history of premature CAD here with shortness of breath and found to have severe systolic dysfunction and moderate to severe mitral regurgitation.  He underwent left and right heart catheterization that revealed three-vessel CAD.  Myocardial viability study revealed viability in 12 out of 17 segments.  Assessment & Plan    # Acute systolic and diastolic heart failure: LVEF newly diagnosed at 20 to 25% this admission.  Mr. Scholle is euvolemic on exam.  Yesterday he reported mild orthopnea.  Chest x-ray showed mild interstitial edema, though improved from prior.  Continue with gentle diuresis.  He was net -1 L yesterday and renal function remained stable. Continue digoxin and Lasix.  Digoxin level was subtherapeutic.  No need to titrate at this time as he appears to be compensated.  He has been hypotensive so he is not on a beta-blocker or Entresto.   # 3 vessel CAD:  # Hyperlipidemia: He was found to have severe three-vessel disease on cardiac cath this admission.  Troponin was mildly elevated to a peak of 0.09.  This is most consistent with demand ischemia.  Continue heparin.  He is being evaluated by cardiothoracic surgery and will go for CABG on Friday.  Continue aspirin and heparin.  No beta-blocker due to hypotension as above.  # Moderate to severe MR: Likely ischemic.  Will need mitral valve replacement/repair the time of surgery.  # DM:  Hemoglobin A1c 7.1%.  Needs SGLT2 inhibitor as an outpatient.  # Tobacco abuse: Patient does not need nicotine or placement patch.  He was down to 2 cigarettes before admission.  Encouraged continued cessation.      For questions or updates, please contact [facility] Please consult [email or web address] for contact info under        Signed, Chilton Si, MD  05/11/2018, 9:32 AM

## 2018-05-11 NOTE — Plan of Care (Signed)

## 2018-05-11 NOTE — Progress Notes (Signed)
Inpatient Diabetes Program Recommendations  AACE/ADA: New Consensus Statement on Inpatient Glycemic Control (2015)  Target Ranges:  Prepandial:   less than 140 mg/dL      Peak postprandial:   less than 180 mg/dL (1-2 hours)      Critically ill patients:  140 - 180 mg/dL   Lab Results  Component Value Date   GLUCAP 130 (H) 05/11/2018   HGBA1C 7.1 (H) 05/04/2018    Review of Glycemic Control Results for Michael Banks (MRN [id number]) as of 05/11/2018 10:16  Ref. Range 05/10/2018 16:35 05/10/2018 20:58 05/11/2018 07:54  Glucose-Capillary Latest Ref Range: 70 - 99 mg/dL 409157 (H) 811125 (H) 914130 (H)   Diabetes history: Type 2 DM, diet controlled.  Outpatient Diabetes medications: none Current orders for Inpatient glycemic control: Novolog 0-5 units QHS, Novolog 0-9 units TID  Inpatient Diabetes Program Recommendations:    Noted consult this AM. Blood glucose trends are all within inpatient goals. Requiring little insulin while inpatient. Plan for CABG later this week. Spoke with RN and consult was placed for CBG reading of 202 mg/ml on 11/18. Appears that patient was eating at time of CBG.  A1C appropriate with plan to start SGLT2 at discharge.   No additional recommendations at this time. Per RN patient has no needs. Will continue to follow as appropriate.   Thanks, Lujean RaveLauren Austine Kelsay, MSN, RNC-OB Diabetes Coordinator [phone] (8a-5p)

## 2018-05-12 ENCOUNTER — Inpatient Hospital Stay (HOSPITAL_COMMUNITY): Payer: Medicare HMO

## 2018-05-12 LAB — GLUCOSE, CAPILLARY
GLUCOSE-CAPILLARY: 132 mg/dL — AB (ref 70–99)
GLUCOSE-CAPILLARY: 108 mg/dL — AB (ref 70–99)
Glucose-Capillary: 185 mg/dL — ABNORMAL HIGH (ref 70–99)
Glucose-Capillary: 117 mg/dL — ABNORMAL HIGH (ref 70–99)

## 2018-05-12 LAB — COMPREHENSIVE METABOLIC PANEL
ALBUMIN: 3.2 g/dL — AB (ref 3.5–5.0)
ALK PHOS: 50 U/L (ref 38–126)
ALT: 30 U/L (ref 0–44)
ANION GAP: 9 (ref 5–15)
AST: 24 U/L (ref 15–41)
BUN: 7 mg/dL — ABNORMAL LOW (ref 8–23)
CALCIUM: 8.9 mg/dL (ref 8.9–10.3)
CHLORIDE: 102 mmol/L (ref 98–111)
CO2: 25 mmol/L (ref 22–32)
CREATININE: 1 mg/dL (ref 0.61–1.24)
GFR calc Af Amer: >60 mL/min (ref >60)
GFR calc non Af Amer: >60 mL/min (ref >60)
GLUCOSE: 121 mg/dL — AB (ref 70–99)
Potassium: 4.1 mmol/L (ref 3.5–5.1)
SODIUM: 136 mmol/L (ref 135–145)
Total Bilirubin: 1.5 mg/dL — ABNORMAL HIGH (ref 0.3–1.2)
Total Protein: 6.6 g/dL (ref 6.5–8.1)

## 2018-05-12 LAB — PROTIME-INR
INR: 1.39
PROTHROMBIN TIME: 16.9 s — AB (ref 11.4–15.2)

## 2018-05-12 LAB — URINALYSIS, ROUTINE W REFLEX MICROSCOPIC
Bilirubin Urine: NEGATIVE
Glucose, UA: NEGATIVE mg/dL
Ketones, ur: NEGATIVE mg/dL
Nitrite: NEGATIVE
PROTEIN: NEGATIVE mg/dL
SPECIFIC GRAVITY, URINE: 1.004 — AB (ref 1.005–1.030)
pH: 7 (ref 5.0–8.0)

## 2018-05-12 LAB — BLOOD GAS, ARTERIAL
Acid-base deficit: 0.6 mmol/L (ref 0.0–2.0)
BICARBONATE: 22.9 mmol/L (ref 20.0–28.0)
DRAWN BY: 535471
O2 Saturation: 94.8 %
PATIENT TEMPERATURE: 98.6
PH ART: 7.452 — AB (ref 7.350–7.450)
pCO2 arterial: 33.3 mmHg (ref 32.0–48.0)
pO2, Arterial: 71.6 mmHg — ABNORMAL LOW (ref 83.0–108.0)

## 2018-05-12 LAB — CBC
HCT: 41.4 % (ref 39.0–52.0)
Hemoglobin: 13.4 g/dL (ref 13.0–17.0)
MCH: 30.1 pg (ref 26.0–34.0)
MCHC: 32.4 g/dL (ref 30.0–36.0)
MCV: 93 fL (ref 80.0–100.0)
Platelets: 216 10*3/uL (ref 150–400)
RBC: 4.45 MIL/uL (ref 4.22–5.81)
RDW: 13.4 % (ref 11.5–15.5)
WBC: 6.3 10*3/uL (ref 4.0–10.5)
nRBC: 0 % (ref 0.0–0.2)

## 2018-05-12 LAB — BASIC METABOLIC PANEL
Anion gap: 10 (ref 5–15)
BUN: 6 mg/dL — ABNORMAL LOW (ref 8–23)
CHLORIDE: 102 mmol/L (ref 98–111)
CO2: 23 mmol/L (ref 22–32)
Calcium: 8.8 mg/dL — ABNORMAL LOW (ref 8.9–10.3)
Creatinine, Ser: 0.96 mg/dL (ref 0.61–1.24)
GFR calc non Af Amer: >60 mL/min (ref >60)
Glucose, Bld: 120 mg/dL — ABNORMAL HIGH (ref 70–99)
Potassium: 4.1 mmol/L (ref 3.5–5.1)
SODIUM: 135 mmol/L (ref 135–145)

## 2018-05-12 LAB — HEMOGLOBIN A1C
Hgb A1c MFr Bld: 7 % — ABNORMAL HIGH (ref 4.8–5.6)
Mean Plasma Glucose: 154.2 mg/dL

## 2018-05-12 LAB — TYPE AND SCREEN
ABO/RH(D): B POS
Antibody Screen: NEGATIVE

## 2018-05-12 LAB — APTT: aPTT: 106 seconds — ABNORMAL HIGH (ref 24–36)

## 2018-05-12 LAB — ABO/RH: ABO/RH(D): B POS

## 2018-05-12 LAB — HEPARIN LEVEL (UNFRACTIONATED): Heparin Unfractionated: 0.5 IU/mL (ref 0.30–0.70)

## 2018-05-12 MED ORDER — DEXMEDETOMIDINE HCL IN NACL 400 MCG/100ML IV SOLN
0.1000 ug/kg/h | INTRAVENOUS | Status: AC
  Administered 2018-05-13: .3 ug/kg/h via INTRAVENOUS
  Filled 2018-05-12: qty 100

## 2018-05-12 MED ORDER — METOPROLOL TARTRATE 12.5 MG HALF TABLET
12.5000 mg | ORAL_TABLET | Freq: Once | ORAL | Status: AC
  Administered 2018-05-13: 12.5 mg via ORAL
  Filled 2018-05-12: qty 1

## 2018-05-12 MED ORDER — BISACODYL 5 MG PO TBEC
5.0000 mg | DELAYED_RELEASE_TABLET | Freq: Once | ORAL | Status: AC
  Administered 2018-05-12: 5 mg via ORAL
  Filled 2018-05-12: qty 1

## 2018-05-12 MED ORDER — TRANEXAMIC ACID (OHS) PUMP PRIME SOLUTION
2.0000 mg/kg | INTRAVENOUS | Status: DC
  Filled 2018-05-12: qty 1.52

## 2018-05-12 MED ORDER — EPINEPHRINE PF 1 MG/ML IJ SOLN
0.0000 ug/min | INTRAVENOUS | Status: DC
  Filled 2018-05-12: qty 4

## 2018-05-12 MED ORDER — CHLORHEXIDINE GLUCONATE 0.12 % MT SOLN
15.0000 mL | Freq: Once | OROMUCOSAL | Status: AC
  Administered 2018-05-13: 15 mL via OROMUCOSAL
  Filled 2018-05-12: qty 15

## 2018-05-12 MED ORDER — TRANEXAMIC ACID (OHS) BOLUS VIA INFUSION
15.0000 mg/kg | INTRAVENOUS | Status: AC
  Administered 2018-05-13: 1138.5 mg via INTRAVENOUS
  Filled 2018-05-12: qty 1139

## 2018-05-12 MED ORDER — DOPAMINE-DEXTROSE 3.2-5 MG/ML-% IV SOLN
0.0000 ug/kg/min | INTRAVENOUS | Status: DC
  Filled 2018-05-12: qty 250

## 2018-05-12 MED ORDER — MAGNESIUM SULFATE 50 % IJ SOLN
40.0000 meq | INTRAMUSCULAR | Status: DC
  Filled 2018-05-12: qty 9.85

## 2018-05-12 MED ORDER — PHENYLEPHRINE HCL-NACL 20-0.9 MG/250ML-% IV SOLN
30.0000 ug/min | INTRAVENOUS | Status: DC
  Filled 2018-05-12: qty 250

## 2018-05-12 MED ORDER — CHLORHEXIDINE GLUCONATE CLOTH 2 % EX PADS
6.0000 | MEDICATED_PAD | Freq: Once | CUTANEOUS | Status: AC
  Administered 2018-05-12: 6 via TOPICAL

## 2018-05-12 MED ORDER — VANCOMYCIN HCL 10 G IV SOLR
1250.0000 mg | INTRAVENOUS | Status: AC
  Administered 2018-05-13: 1250 mg via INTRAVENOUS
  Filled 2018-05-12: qty 1250

## 2018-05-12 MED ORDER — INSULIN REGULAR(HUMAN) IN NACL 100-0.9 UT/100ML-% IV SOLN
INTRAVENOUS | Status: AC
  Administered 2018-05-13: 1 [IU]/h via INTRAVENOUS
  Filled 2018-05-12: qty 100

## 2018-05-12 MED ORDER — MILRINONE LACTATE IN DEXTROSE 20-5 MG/100ML-% IV SOLN
0.3000 ug/kg/min | INTRAVENOUS | Status: AC
  Administered 2018-05-13: .375 ug/kg/min via INTRAVENOUS
  Filled 2018-05-12: qty 100

## 2018-05-12 MED ORDER — TRANEXAMIC ACID 1000 MG/10ML IV SOLN
1.5000 mg/kg/h | INTRAVENOUS | Status: AC
  Administered 2018-05-13: 1.5 mg/kg/h via INTRAVENOUS
  Filled 2018-05-12: qty 25

## 2018-05-12 MED ORDER — SODIUM CHLORIDE 0.9 % IV SOLN
INTRAVENOUS | Status: DC
  Filled 2018-05-12: qty 30

## 2018-05-12 MED ORDER — PLASMA-LYTE 148 IV SOLN
INTRAVENOUS | Status: DC
  Filled 2018-05-12: qty 2.5

## 2018-05-12 MED ORDER — SODIUM CHLORIDE 0.9 % IV SOLN
750.0000 mg | INTRAVENOUS | Status: AC
  Administered 2018-05-13: 750 mg via INTRAVENOUS
  Filled 2018-05-12: qty 750

## 2018-05-12 MED ORDER — POTASSIUM CHLORIDE 2 MEQ/ML IV SOLN
80.0000 meq | INTRAVENOUS | Status: DC
  Filled 2018-05-12: qty 40

## 2018-05-12 MED ORDER — CHLORHEXIDINE GLUCONATE CLOTH 2 % EX PADS
6.0000 | MEDICATED_PAD | Freq: Once | CUTANEOUS | Status: AC
  Administered 2018-05-13: 6 via TOPICAL

## 2018-05-12 MED ORDER — NITROGLYCERIN IN D5W 200-5 MCG/ML-% IV SOLN
2.0000 ug/min | INTRAVENOUS | Status: AC
  Administered 2018-05-13: 16.6 ug/min via INTRAVENOUS
  Filled 2018-05-12 (×2): qty 250

## 2018-05-12 MED ORDER — SODIUM CHLORIDE 0.9 % IV SOLN
1.5000 g | INTRAVENOUS | Status: AC
  Administered 2018-05-13: 1.5 g via INTRAVENOUS
  Filled 2018-05-12: qty 1.5

## 2018-05-12 NOTE — H&P (View-Only) (Signed)
7 Days Post-Op Procedure(s) (LRB): Ultrasound Guidance For Vascular Access RIGHT/LEFT HEART CATH AND CORONARY ANGIOGRAPHY (N/A) Subjective: No complaints this AM  Objective: Vital signs in last 24 hours: Temp:  [97.5 F (36.4 C)-97.6 F (36.4 C)] 97.5 F (36.4 C) (11/21 0800) Pulse Rate:  [89-91] 89 (11/21 0800) Cardiac Rhythm: Normal sinus rhythm (11/21 0715) Resp:  [14-18] 16 (11/21 0800) BP: (94-109)/(72-76) 105/75 (11/21 0800) SpO2:  [93 %-98 %] 97 % (11/21 0800) Weight:  [75.9 kg] 75.9 kg (11/21 0611)  Hemodynamic parameters for last 24 hours:    Intake/Output from previous day: 11/20 0701 - 11/21 0700 In: 990 [P.O.:720; I.V.:270] Out: 1400 [Urine:1400] Intake/Output this shift: Total I/O In: 360 [P.O.:360] Out: -   General appearance: alert, cooperative and no distress Neurologic: intact Heart: regular rate and rhythm Lungs: clear to auscultation bilaterally  Lab Results: Recent Labs    05/11/18 0356 05/12/18 0516  WBC 5.7 6.3  HGB 12.7* 13.4  HCT 39.4 41.4  PLT 222 216   BMET:  Recent Labs    05/11/18 0356 05/12/18 0516  NA 134* 135  K 3.8 4.1  CL 102 102  CO2 19* 23  GLUCOSE 116* 120*  BUN 5* 6*  CREATININE 1.01 0.96  CALCIUM 8.5* 8.8*    PT/INR: No results for input(s): LABPROT, INR in the last 72 hours. ABG    Component Value Date/Time   PHART 7.459 (H) 05/05/2018 0928   HCO3 27.0 05/05/2018 0933   TCO2 28 05/05/2018 0933   O2SAT 68.0 05/05/2018 0933   CBG (last 3)  Recent Labs    05/11/18 2142 05/12/18 0745 05/12/18 1137  GLUCAP 139* 132* 185*    Assessment/Plan: S/P Procedure(s) (LRB): Ultrasound Guidance For Vascular Access RIGHT/LEFT HEART CATH AND CORONARY ANGIOGRAPHY (N/A) -3 vessel CAD with severe ischemic cardiomyopathy and severe mitral regurgitation Plan is for CABG + Mitral repair/replacement tomorrow He is aware of the risks and benefits.  We discussed valve options should replacement be needed. He prefers a  tissue valve to avoid need for lifelong anticoagulation.   LOS: 9 days    Michael Banks 05/12/2018  

## 2018-05-12 NOTE — Progress Notes (Signed)
7 Days Post-Op Procedure(s) (LRB): Ultrasound Guidance For Vascular Access RIGHT/LEFT HEART CATH AND CORONARY ANGIOGRAPHY (N/A) Subjective: No complaints this AM  Objective: Vital signs in last 24 hours: Temp:  [97.5 F (36.4 C)-97.6 F (36.4 C)] 97.5 F (36.4 C) (11/21 0800) Pulse Rate:  [89-91] 89 (11/21 0800) Cardiac Rhythm: Normal sinus rhythm (11/21 0715) Resp:  [14-18] 16 (11/21 0800) BP: (94-109)/(72-76) 105/75 (11/21 0800) SpO2:  [93 %-98 %] 97 % (11/21 0800) Weight:  [75.9 kg] 75.9 kg (11/21 0611)  Hemodynamic parameters for last 24 hours:    Intake/Output from previous day: 11/20 0701 - 11/21 0700 In: 990 [P.O.:720; I.V.:270] Out: 1400 [Urine:1400] Intake/Output this shift: Total I/O In: 360 [P.O.:360] Out: -   General appearance: alert, cooperative and no distress Neurologic: intact Heart: regular rate and rhythm Lungs: clear to auscultation bilaterally  Lab Results: Recent Labs    05/11/18 0356 05/12/18 0516  WBC 5.7 6.3  HGB 12.7* 13.4  HCT 39.4 41.4  PLT 222 216   BMET:  Recent Labs    05/11/18 0356 05/12/18 0516  NA 134* 135  K 3.8 4.1  CL 102 102  CO2 19* 23  GLUCOSE 116* 120*  BUN 5* 6*  CREATININE 1.01 0.96  CALCIUM 8.5* 8.8*    PT/INR: No results for input(s): LABPROT, INR in the last 72 hours. ABG    Component Value Date/Time   PHART 7.459 (H) 05/05/2018 0928   HCO3 27.0 05/05/2018 0933   TCO2 28 05/05/2018 0933   O2SAT 68.0 05/05/2018 0933   CBG (last 3)  Recent Labs    05/11/18 2142 05/12/18 0745 05/12/18 1137  GLUCAP 139* 132* 185*    Assessment/Plan: S/P Procedure(s) (LRB): Ultrasound Guidance For Vascular Access RIGHT/LEFT HEART CATH AND CORONARY ANGIOGRAPHY (N/A) -3 vessel CAD with severe ischemic cardiomyopathy and severe mitral regurgitation Plan is for CABG + Mitral repair/replacement tomorrow He is aware of the risks and benefits.  We discussed valve options should replacement be needed. He prefers a  tissue valve to avoid need for lifelong anticoagulation.   LOS: 9 days    Loreli SlotSteven C Shakthi Scipio 05/12/2018

## 2018-05-12 NOTE — Progress Notes (Signed)
ANTICOAGULATION CONSULT NOTE  Pharmacy Consult for Heparin Indication: chest pain/ACS  No Known Allergies  Patient Measurements: Height: 5\' 10"  (177.8 cm) Weight: 167 lb 6.4 oz (75.9 kg) IBW/kg (Calculated) : 73 Heparin Dosing Weight: 79.4   Vital Signs: Temp: 97.5 F (36.4 C) (11/21 0800) Temp Source: Oral (11/21 0800) BP: 105/75 (11/21 0800) Pulse Rate: 89 (11/21 0800)  Labs: Recent Labs    05/10/18 0433 05/11/18 0356 05/12/18 0516  HGB 14.7 12.7* 13.4  HCT 44.7 39.4 41.4  PLT 257 222 216  HEPARINUNFRC 0.24* 0.55 0.50  CREATININE 1.04 1.01 0.96    Estimated Creatinine Clearance: 80.3 mL/min (by C-G formula based on SCr of 0.96 mg/dL).   Assessment: Patient is [age] yoM presenting with shortness of breath and left arm pain with elevated troponin but trend was flat. Pharmacy consulted for heparin dosing. No anticoagulation PTA. Tentative plan is for CABG Friday 11/22  Heparin level therapetuic  Goal of Therapy:  Heparin level 0.3-0.7 units/ml Monitor platelets by anticoagulation protocol: Yes   Plan:  Continue heparin at 1400 units / hr Daily heparin level and CBC while on heparin Monitor for s/sx of bleeding  Thanks for allowing pharmacy to be a part of this patient's care.  Okey RegalLisa Karthik Whittinghill, PharmD  05/12/2018   9:10 AM

## 2018-05-12 NOTE — Plan of Care (Signed)

## 2018-05-12 NOTE — Progress Notes (Signed)
Progress Note  Patient Name: Michael Banks Date of Encounter: 05/12/2018  Primary Cardiologist: Tobias Alexander, MD   Subjective   No significant overnight events. Patient continues to report some orthopnea but notes improvement form admission. No chest pain. No shortness of breath with ambulation. Patient has not had a bowel movement since Saturday. He denies any abdominal pain but states he is starting to feel bloated. He got a dose of Dulcolax yesterday evening.   Inpatient Medications    Scheduled Meds: . aspirin EC  81 mg Oral Daily  . atorvastatin  80 mg Oral q1800  . digoxin  0.125 mg Oral Daily  . furosemide  40 mg Oral Daily  . insulin aspart  0-5 Units Subcutaneous QHS  . insulin aspart  0-9 Units Subcutaneous TID WC  . sodium chloride flush  3 mL Intravenous Q12H  . sodium chloride flush  3 mL Intravenous Q12H   Continuous Infusions: . sodium chloride    . sodium chloride    . heparin 1,350 Units/hr (05/12/18 0600)   PRN Meds: sodium chloride, sodium chloride, acetaminophen **OR** acetaminophen, bisacodyl, ondansetron **OR** ondansetron (ZOFRAN) IV, sodium chloride flush, sodium chloride flush, zolpidem   Vital Signs    Vitals:   05/11/18 2146 05/12/18 0610 05/12/18 0611 05/12/18 0800  BP: 94/72 109/76  105/75  Pulse: 91 90  89  Resp: 18 14  16   Temp: 97.6 F (36.4 C) (!) 97.5 F (36.4 C)  (!) 97.5 F (36.4 C)  TempSrc: Oral Oral  Oral  SpO2: 98% 93%  97%  Weight:   75.9 kg   Height:        Intake/Output Summary (Last 24 hours) at 05/12/2018 0948 Last data filed at 05/12/2018 0900 Gross per 24 hour  Intake 1110 ml  Output 1400 ml  Net -290 ml   Filed Weights   05/10/18 0429 05/11/18 0042 05/12/18 0611  Weight: 76.2 kg 76.2 kg 75.9 kg    Telemetry    Sinus rhythm with occasional PVCs - Personally Reviewed  Physical Exam   GEN: 67 year old male resting comfortably in chair. Alert and in no acute distress.   Neck: Supple.  Cardiac:  RRR. Unable to appreciate previous systolic murmur today. No rubs or gallops.  Respiratory: No increased work of breathing. Clear to auscultation bilaterally. GI: Abdomen soft, non-tender, non-distended. Bowel sounds present.  MS: No lower extremity edema.  Neuro:  No focal deficits. Psych: Normal affect.   Labs    Chemistry Recent Labs  Lab 05/10/18 0433 05/11/18 0356 05/12/18 0516  NA 136 134* 135  K 4.1 3.8 4.1  CL 100 102 102  CO2 25 19* 23  GLUCOSE 126* 116* 120*  BUN 7* 5* 6*  CREATININE 1.04 1.01 0.96  CALCIUM 9.1 8.5* 8.8*  GFRNONAA >60 >60 >60  GFRAA >60 >60 >60  ANIONGAP 11 13 10      Hematology Recent Labs  Lab 05/10/18 0433 05/11/18 0356 05/12/18 0516  WBC 5.3 5.7 6.3  RBC 4.86 4.29 4.45  HGB 14.7 12.7* 13.4  HCT 44.7 39.4 41.4  MCV 92.0 91.8 93.0  MCH 30.2 29.6 30.1  MCHC 32.9 32.2 32.4  RDW 13.4 13.4 13.4  PLT 257 222 216    Cardiac EnzymesNo results for input(s): TROPONINI in the last 168 hours. No results for input(s): TROPIPOC in the last 168 hours.   BNPNo results for input(s): BNP, PROBNP in the last 168 hours.   DDimer No results for input(s): DDIMER  in the last 168 hours.   Radiology    Dg Chest Port 1 View  Result Date: 05/10/2018 CLINICAL DATA:  Productive cough. EXAM: PORTABLE CHEST 1 VIEW COMPARISON:  Chest x-ray 05/03/2018. FINDINGS: Cardiomegaly with bilateral mild interstitial prominence and small right pleural effusion noted. Findings suggest mild CHF. Findings improved from 05/03/2018. No pneumothorax. No acute bony abnormality. IMPRESSION: Cardiomegaly with mild bilateral interstitial edema and small right pleural effusion. Findings improved from prior study of 05/03/2018. Electronically Signed   By: Maisie Fushomas  Register   On: 05/10/2018 11:23    Cardiac Studies   Echocardiogram 05/04/2018: Study Conclusions: - Left ventricle: The cavity size was moderately dilated. Systolic   function was severely reduced. The estimated  ejection fraction   was in the range of 20% to 25%. Indeterminate diastolic function.   Severe diffuse hypokinesis. - Regional wall motion abnormality: Akinesis of the mid   inferolateral, basal-mid anterolateral, and apical lateral   myocardium. - Aortic valve: Transvalvular velocity was within the normal range.   There was no stenosis. There was no regurgitation. - Mitral valve: Mitral valve leaflet tenting. There was moderate   regurgitation. The PISA quantitation may slightly underestimate   severity, with one jet more central, and one jet eccentric and   anteriorly directed (clip 71). - Left atrium: The atrium was mildly dilated. - Right ventricle: The cavity size was mildly dilated. Wall   thickness was normal. Systolic function was moderately reduced. - Tricuspid valve: There was trivial regurgitation. - Inferior vena cava: The vessel was dilated. The respirophasic   diameter changes were blunted (< 50%), consistent with elevated   central venous pressure. - Pericardium, extracardiac: Pleural effusion. 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  There is severe (4+) mitral regurgitation.  SEVERE MULTIVESSEL DISEASE  Prox RCA to Mid RCA lesion is 90% stenosed. Mid RCA to Dist RCA lesion is 100% stenosed with 100% stenosed side branch in Post Atrio.  Prox LAD lesion is 99% stenosed. Mid LAD-1 lesion is 80% stenosed. Mid LAD-2 lesion is 90% stenosed. Dist LAD lesion is 85% stenosed.  Prox Cx lesion is 95% stenosed. 2nd Mrg lesion is 90% stenosed with 90% stenosed side branch in Lat 2nd Mrg.   Summary:  Severe multivessel CAD: Diffuse proximal RCA disease with mid 100% CTO filling with bridging collaterals and  right-to-left collaterals, separate ostium circumflex with proximal 95% stenosis and bifurcation 90% stenosis of OM1, 99% mid LAD after very large septal perforator trunk (perfusing PDA) with bridging collaterals filling mid to distal LAD with a large diagonal branch and diffuse disease throughout the distal LAD.  Moderate pulmonary hypertension likely secondary to pulmonary venous congestion and mitral regurgitation.  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  Recommendations:  Return to nursing unit for TR band removal.  Continue aggressive CHF management  The patient would likely best benefit from CABG plus possible mitral valve repair. --We will consult CVTS  Will place on IV heparin 8 hours after sheath removal  Consider viability study as part of assessment for revascularization options.  Percutaneous options would be limited and extremely high risk  given his low output and multivessel disease.  Would likely need support with Impella  Recommend Aspirin 81mg  daily for severe CAD. _______________  Cardiac MRI 05/06/2018: Impression: 1. Severely dilated left ventricle with normal thickness and severely decreased systolic function (LVEF = 21%). There is global hypokinesis and akinesis in the basal and mid inferolateral, anterolateral and apical lateral and inferior walls.  There is late gadolinium enhancement in the basal and mid inferolateral and apical lateral, inferior walls (75-100%, poor chance of recovery if revascularized) and mid inferior and anterolateral walls (25-50%, good chance of recovery if revascularized).  2. Mildly dilated right ventricle with moderately to severely decreased systolic function (LVEF =  25%). There is diffuse hypokinesis.  3. Moderately dilated left atrium and mildly dilated right atrium.  4. Severe mitral and mild tricuspid regurgitation.  5. Trivial pericardial effusion.  Myocardium viable in 12 out of 17 left ventricular segments.  Patient Profile   Mr. Lindstrom is a 67 year old male with a history of hypertension, hyperlipidemia, tobacco abuse, and family history of premature CAD here with shortness of breath and found to have severe systolic dysfunction and moderate to severe mitral regurgitation. He underwent left and right heart catheterization that revealed three-vessel CAD. Myocardial viability study revealed viability in 12 out of 17 segments.  Assessment & Plan    Acute Systolic and Diastolic Heart Failure - Patient presented with worsening shortness of breath, orthopnea, and PND. - Echo on 05/04/2018 showed LVEF of 20-25% with akinesis of mid inferolateral, basal-mid anterolateral, and apical lateral myocardium. - Documented urine output 1.4 L in the past 24 hours with net negative 9.2 L since admission.  - Weight 167.4 lbs today, down from 183 lbs on admission. - Patient does no appear volume overloaded on exam but continues to report some orthopnea.  - Continue diuresis with PO Lasix 40mg . - Continue Digoxin 0.125mg  daily. Digoxin level on 11/19 was subtherapeutic at 0.3. - No beta-blocker or Entresto due to hypotension. Most recent BP 105/75.  - Continue to monitor daily weights and strict I's&O's. - Continue to monitor renal function while being diuresed.   3-Vessel CAD - Cardiac catheterization on 05/05/2018 showed severe 3-vessel disease.  - Troponin peaked at 0.09. Most consistent with demand ischemia. - He is scheduled for CABG tomorrow.  - Continue Aspirin and Heparin. - No beta-blocker due to hypotension.  Moderate to Sever MR - Likely ischemic.  - Will need mitral valve replacement/repair at time of surgery.  Hyperlipidemia - LDL 87  on admission. Goal <70 given CAD.  - Continue Lipitor 80mg  daily.  DM - Hgb A1c 7.1 on admission. - Continue sliding scale insuline. - Will need SGLT2 inhibitor as an outpatient.  Tobacco Abuse - Patient does not need nicotine or placement patch.  He was down to 2 cigarettes before admission. - Encouraged continued cessation.  For questions or updates, please contact [facility] Please consult [email or web address] for contact info under        Signed, Corrin Parker, PA-C  05/12/2018, 9:48 AM

## 2018-05-13 ENCOUNTER — Inpatient Hospital Stay (HOSPITAL_COMMUNITY): Payer: Medicare HMO

## 2018-05-13 ENCOUNTER — Inpatient Hospital Stay (HOSPITAL_COMMUNITY): Payer: Medicare HMO | Admitting: Certified Registered"

## 2018-05-13 ENCOUNTER — Inpatient Hospital Stay (HOSPITAL_COMMUNITY)
Admission: EM | Disposition: A | Discharge status: Home Health | Payer: Self-pay | Source: Home / Self Care | Attending: Thoracic Surgery (Cardiothoracic Vascular Surgery)

## 2018-05-13 DIAGNOSIS — Z951 Presence of aortocoronary bypass graft: Secondary | ICD-10-CM

## 2018-05-13 DIAGNOSIS — I341 Nonrheumatic mitral (valve) prolapse: Secondary | ICD-10-CM

## 2018-05-13 HISTORY — PX: TEE WITHOUT CARDIOVERSION: SHX5443

## 2018-05-13 HISTORY — PX: CORONARY ARTERY BYPASS GRAFT: SHX141

## 2018-05-13 HISTORY — PX: MITRAL VALVE REPAIR: SHX2039

## 2018-05-13 LAB — BASIC METABOLIC PANEL
ANION GAP: 9 (ref 5–15)
BUN: 7 mg/dL — AB (ref 8–23)
CHLORIDE: 103 mmol/L (ref 98–111)
CO2: 23 mmol/L (ref 22–32)
Calcium: 8.9 mg/dL (ref 8.9–10.3)
Creatinine, Ser: 0.94 mg/dL (ref 0.61–1.24)
GFR calc Af Amer: >60 mL/min (ref >60)
Glucose, Bld: 124 mg/dL — ABNORMAL HIGH (ref 70–99)
POTASSIUM: 3.9 mmol/L (ref 3.5–5.1)
SODIUM: 135 mmol/L (ref 135–145)

## 2018-05-13 LAB — CBC
HCT: 43.6 % (ref 39.0–52.0)
HCT: 31.6 % — ABNORMAL LOW (ref 39.0–52.0)
HEMATOCRIT: 30.2 % — AB (ref 39.0–52.0)
HEMOGLOBIN: 9.8 g/dL — AB (ref 13.0–17.0)
Hemoglobin: 13.6 g/dL (ref 13.0–17.0)
Hemoglobin: 10.2 g/dL — ABNORMAL LOW (ref 13.0–17.0)
MCH: 29.1 pg (ref 26.0–34.0)
MCH: 30.4 pg (ref 26.0–34.0)
MCH: 30.7 pg (ref 26.0–34.0)
MCHC: 31.2 g/dL (ref 30.0–36.0)
MCHC: 32.3 g/dL (ref 30.0–36.0)
MCHC: 32.5 g/dL (ref 30.0–36.0)
MCV: 93.4 fL (ref 80.0–100.0)
MCV: 94 fL (ref 80.0–100.0)
MCV: 94.7 fL (ref 80.0–100.0)
NRBC: 0 % (ref 0.0–0.2)
Platelets: 237 10*3/uL (ref 150–400)
Platelets: 147 10*3/uL — ABNORMAL LOW (ref 150–400)
Platelets: 154 10*3/uL (ref 150–400)
RBC: 4.67 MIL/uL (ref 4.22–5.81)
RBC: 3.36 MIL/uL — ABNORMAL LOW (ref 4.22–5.81)
RBC: 3.19 MIL/uL — AB (ref 4.22–5.81)
RDW: 13.7 % (ref 11.5–15.5)
RDW: 13.5 % (ref 11.5–15.5)
RDW: 13.7 % (ref 11.5–15.5)
WBC: 6.3 10*3/uL (ref 4.0–10.5)
WBC: 10.9 10*3/uL — AB (ref 4.0–10.5)
WBC: 11 10*3/uL — AB (ref 4.0–10.5)
nRBC: 0 % (ref 0.0–0.2)
nRBC: 0 % (ref 0.0–0.2)

## 2018-05-13 LAB — POCT I-STAT, CHEM 8
BUN: 5 mg/dL — AB (ref 8–23)
BUN: 6 mg/dL — ABNORMAL LOW (ref 8–23)
BUN: 5 mg/dL — ABNORMAL LOW (ref 8–23)
BUN: 5 mg/dL — AB (ref 8–23)
BUN: 5 mg/dL — ABNORMAL LOW (ref 8–23)
BUN: 6 mg/dL — AB (ref 8–23)
BUN: 6 mg/dL — ABNORMAL LOW (ref 8–23)
BUN: 6 mg/dL — AB (ref 8–23)
BUN: 6 mg/dL — AB (ref 8–23)
CALCIUM ION: 1.18 mmol/L (ref 1.15–1.40)
CALCIUM ION: 1.05 mmol/L — AB (ref 1.15–1.40)
CALCIUM ION: 1.08 mmol/L — AB (ref 1.15–1.40)
CALCIUM ION: 1.06 mmol/L — AB (ref 1.15–1.40)
CALCIUM ION: 1.18 mmol/L (ref 1.15–1.40)
CHLORIDE: 104 mmol/L (ref 98–111)
CHLORIDE: 104 mmol/L (ref 98–111)
CHLORIDE: 110 mmol/L (ref 98–111)
CREATININE: 0.6 mg/dL — AB (ref 0.61–1.24)
CREATININE: 0.7 mg/dL (ref 0.61–1.24)
CREATININE: 0.6 mg/dL — AB (ref 0.61–1.24)
CREATININE: 0.6 mg/dL — AB (ref 0.61–1.24)
Calcium, Ion: 1.19 mmol/L (ref 1.15–1.40)
Calcium, Ion: 1.08 mmol/L — ABNORMAL LOW (ref 1.15–1.40)
Calcium, Ion: 1.11 mmol/L — ABNORMAL LOW (ref 1.15–1.40)
Calcium, Ion: 1.29 mmol/L (ref 1.15–1.40)
Chloride: 102 mmol/L (ref 98–111)
Chloride: 103 mmol/L (ref 98–111)
Chloride: 99 mmol/L (ref 98–111)
Chloride: 101 mmol/L (ref 98–111)
Chloride: 103 mmol/L (ref 98–111)
Chloride: 104 mmol/L (ref 98–111)
Creatinine, Ser: 0.6 mg/dL — ABNORMAL LOW (ref 0.61–1.24)
Creatinine, Ser: 0.6 mg/dL — ABNORMAL LOW (ref 0.61–1.24)
Creatinine, Ser: 0.5 mg/dL — ABNORMAL LOW (ref 0.61–1.24)
Creatinine, Ser: 0.6 mg/dL — ABNORMAL LOW (ref 0.61–1.24)
Creatinine, Ser: 0.6 mg/dL — ABNORMAL LOW (ref 0.61–1.24)
GLUCOSE: 129 mg/dL — AB (ref 70–99)
GLUCOSE: 102 mg/dL — AB (ref 70–99)
Glucose, Bld: 138 mg/dL — ABNORMAL HIGH (ref 70–99)
Glucose, Bld: 128 mg/dL — ABNORMAL HIGH (ref 70–99)
Glucose, Bld: 128 mg/dL — ABNORMAL HIGH (ref 70–99)
Glucose, Bld: 118 mg/dL — ABNORMAL HIGH (ref 70–99)
Glucose, Bld: 110 mg/dL — ABNORMAL HIGH (ref 70–99)
Glucose, Bld: 121 mg/dL — ABNORMAL HIGH (ref 70–99)
Glucose, Bld: 115 mg/dL — ABNORMAL HIGH (ref 70–99)
HCT: 36 % — ABNORMAL LOW (ref 39.0–52.0)
HCT: 35 % — ABNORMAL LOW (ref 39.0–52.0)
HCT: 27 % — ABNORMAL LOW (ref 39.0–52.0)
HCT: 26 % — ABNORMAL LOW (ref 39.0–52.0)
HCT: 27 % — ABNORMAL LOW (ref 39.0–52.0)
HEMATOCRIT: 29 % — AB (ref 39.0–52.0)
HEMATOCRIT: 27 % — AB (ref 39.0–52.0)
HEMATOCRIT: 24 % — AB (ref 39.0–52.0)
HEMATOCRIT: 24 % — AB (ref 39.0–52.0)
HEMOGLOBIN: 11.9 g/dL — AB (ref 13.0–17.0)
HEMOGLOBIN: 9.2 g/dL — AB (ref 13.0–17.0)
HEMOGLOBIN: 8.8 g/dL — AB (ref 13.0–17.0)
HEMOGLOBIN: 8.2 g/dL — AB (ref 13.0–17.0)
Hemoglobin: 12.2 g/dL — ABNORMAL LOW (ref 13.0–17.0)
Hemoglobin: 9.9 g/dL — ABNORMAL LOW (ref 13.0–17.0)
Hemoglobin: 9.2 g/dL — ABNORMAL LOW (ref 13.0–17.0)
Hemoglobin: 8.2 g/dL — ABNORMAL LOW (ref 13.0–17.0)
Hemoglobin: 9.2 g/dL — ABNORMAL LOW (ref 13.0–17.0)
POTASSIUM: 4 mmol/L (ref 3.5–5.1)
POTASSIUM: 4.7 mmol/L (ref 3.5–5.1)
POTASSIUM: 4.7 mmol/L (ref 3.5–5.1)
POTASSIUM: 4.4 mmol/L (ref 3.5–5.1)
Potassium: 3.8 mmol/L (ref 3.5–5.1)
Potassium: 4 mmol/L (ref 3.5–5.1)
Potassium: 4.7 mmol/L (ref 3.5–5.1)
Potassium: 5.7 mmol/L — ABNORMAL HIGH (ref 3.5–5.1)
Potassium: 4.7 mmol/L (ref 3.5–5.1)
SODIUM: 136 mmol/L (ref 135–145)
SODIUM: 136 mmol/L (ref 135–145)
SODIUM: 135 mmol/L (ref 135–145)
SODIUM: 137 mmol/L (ref 135–145)
SODIUM: 137 mmol/L (ref 135–145)
Sodium: 136 mmol/L (ref 135–145)
Sodium: 137 mmol/L (ref 135–145)
Sodium: 137 mmol/L (ref 135–145)
Sodium: 136 mmol/L (ref 135–145)
TCO2: 28 mmol/L (ref 22–32)
TCO2: 27 mmol/L (ref 22–32)
TCO2: 27 mmol/L (ref 22–32)
TCO2: 28 mmol/L (ref 22–32)
TCO2: 27 mmol/L (ref 22–32)
TCO2: 25 mmol/L (ref 22–32)
TCO2: 27 mmol/L (ref 22–32)
TCO2: 23 mmol/L (ref 22–32)
TCO2: 21 mmol/L — AB (ref 22–32)

## 2018-05-13 LAB — POCT I-STAT 3, ART BLOOD GAS (G3+)
ACID-BASE DEFICIT: 1 mmol/L (ref 0.0–2.0)
ACID-BASE DEFICIT: 4 mmol/L — AB (ref 0.0–2.0)
ACID-BASE DEFICIT: 2 mmol/L (ref 0.0–2.0)
ACID-BASE DEFICIT: 4 mmol/L — AB (ref 0.0–2.0)
Acid-base deficit: 7 mmol/L — ABNORMAL HIGH (ref 0.0–2.0)
BICARBONATE: 24.3 mmol/L (ref 20.0–28.0)
BICARBONATE: 23.4 mmol/L (ref 20.0–28.0)
Bicarbonate: 26.1 mmol/L (ref 20.0–28.0)
Bicarbonate: 21.3 mmol/L (ref 20.0–28.0)
Bicarbonate: 18.7 mmol/L — ABNORMAL LOW (ref 20.0–28.0)
O2 SAT: 97 %
O2 SAT: 89 %
O2 Saturation: 100 %
O2 Saturation: 100 %
O2 Saturation: 100 %
PCO2 ART: 43.5 mmHg (ref 32.0–48.0)
PO2 ART: 300 mmHg — AB (ref 83.0–108.0)
PO2 ART: 353 mmHg — AB (ref 83.0–108.0)
Patient temperature: 37.5
Patient temperature: 35.9
TCO2: 26 mmol/L (ref 22–32)
TCO2: 28 mmol/L (ref 22–32)
TCO2: 25 mmol/L (ref 22–32)
TCO2: 22 mmol/L (ref 22–32)
TCO2: 20 mmol/L — AB (ref 22–32)
pCO2 arterial: 52.3 mmHg — ABNORMAL HIGH (ref 32.0–48.0)
pCO2 arterial: 55.9 mmHg — ABNORMAL HIGH (ref 32.0–48.0)
pCO2 arterial: 38.7 mmHg (ref 32.0–48.0)
pCO2 arterial: 37.2 mmHg (ref 32.0–48.0)
pH, Arterial: 7.247 — ABNORMAL LOW (ref 7.350–7.450)
pH, Arterial: 7.305 — ABNORMAL LOW (ref 7.350–7.450)
pH, Arterial: 7.342 — ABNORMAL LOW (ref 7.350–7.450)
pH, Arterial: 7.343 — ABNORMAL LOW (ref 7.350–7.450)
pH, Arterial: 7.309 — ABNORMAL LOW (ref 7.350–7.450)
pO2, Arterial: 416 mmHg — ABNORMAL HIGH (ref 83.0–108.0)
pO2, Arterial: 88 mmHg (ref 83.0–108.0)
pO2, Arterial: 62 mmHg — ABNORMAL LOW (ref 83.0–108.0)

## 2018-05-13 LAB — SURGICAL PCR SCREEN
MRSA, PCR: NEGATIVE
STAPHYLOCOCCUS AUREUS: POSITIVE — AB

## 2018-05-13 LAB — POCT I-STAT 4, (NA,K, GLUC, HGB,HCT)
GLUCOSE: 115 mg/dL — AB (ref 70–99)
HEMATOCRIT: 27 % — AB (ref 39.0–52.0)
Hemoglobin: 9.2 g/dL — ABNORMAL LOW (ref 13.0–17.0)
Potassium: 4.4 mmol/L (ref 3.5–5.1)
SODIUM: 139 mmol/L (ref 135–145)

## 2018-05-13 LAB — CREATININE, SERUM
CREATININE: 0.79 mg/dL (ref 0.61–1.24)
GFR calc Af Amer: >60 mL/min (ref >60)

## 2018-05-13 LAB — GLUCOSE, CAPILLARY
GLUCOSE-CAPILLARY: 113 mg/dL — AB (ref 70–99)
GLUCOSE-CAPILLARY: 115 mg/dL — AB (ref 70–99)
GLUCOSE-CAPILLARY: 101 mg/dL — AB (ref 70–99)
GLUCOSE-CAPILLARY: 92 mg/dL (ref 70–99)
GLUCOSE-CAPILLARY: 157 mg/dL — AB (ref 70–99)
Glucose-Capillary: 86 mg/dL (ref 70–99)

## 2018-05-13 LAB — APTT: aPTT: 39 seconds — ABNORMAL HIGH (ref 24–36)

## 2018-05-13 LAB — PLATELET COUNT: PLATELETS: 99 10*3/uL — AB (ref 150–400)

## 2018-05-13 LAB — PROTIME-INR
INR: 1.83
PROTHROMBIN TIME: 21 s — AB (ref 11.4–15.2)

## 2018-05-13 LAB — HEPARIN LEVEL (UNFRACTIONATED): Heparin Unfractionated: 0.43 IU/mL (ref 0.30–0.70)

## 2018-05-13 LAB — HEMOGLOBIN AND HEMATOCRIT, BLOOD
HCT: 25.4 % — ABNORMAL LOW (ref 39.0–52.0)
HEMOGLOBIN: 8.4 g/dL — AB (ref 13.0–17.0)

## 2018-05-13 LAB — MAGNESIUM: MAGNESIUM: 3.1 mg/dL — AB (ref 1.7–2.4)

## 2018-05-13 SURGERY — CORONARY ARTERY BYPASS GRAFTING (CABG)
Anesthesia: General | Site: Chest

## 2018-05-13 MED ORDER — DEXAMETHASONE SODIUM PHOSPHATE 10 MG/ML IJ SOLN
INTRAMUSCULAR | Status: AC
  Filled 2018-05-13: qty 1

## 2018-05-13 MED ORDER — SODIUM CHLORIDE 0.9% FLUSH: 3.0000 mL | INTRAVENOUS | Status: DC | PRN

## 2018-05-13 MED ORDER — SODIUM BICARBONATE 8.4 % IV SOLN
50.0000 meq | Freq: Once | INTRAVENOUS | Status: AC
  Administered 2018-05-13: 50 meq via INTRAVENOUS
  Filled 2018-05-13: qty 50

## 2018-05-13 MED ORDER — ACETAMINOPHEN 650 MG RE SUPP
650.0000 mg | Freq: Once | RECTAL | Status: AC
  Administered 2018-05-13: 650 mg via RECTAL

## 2018-05-13 MED ORDER — POTASSIUM CHLORIDE 10 MEQ/50ML IV SOLN: 10.0000 meq | INTRAVENOUS | Status: AC

## 2018-05-13 MED ORDER — ALBUMIN HUMAN 5 % IV SOLN
INTRAVENOUS | Status: AC
  Administered 2018-05-13: 12.5 g via INTRAVENOUS
  Filled 2018-05-13: qty 250

## 2018-05-13 MED ORDER — BISACODYL 5 MG PO TBEC
10.0000 mg | DELAYED_RELEASE_TABLET | Freq: Every day | ORAL | Status: DC
  Administered 2018-05-14 – 2018-05-16 (×3): 10 mg via ORAL
  Filled 2018-05-13 (×3): qty 2

## 2018-05-13 MED ORDER — SODIUM CHLORIDE 0.9 % IV SOLN
INTRAVENOUS | Status: DC | PRN
  Administered 2018-05-13: 25 ug/min via INTRAVENOUS

## 2018-05-13 MED ORDER — MIDAZOLAM HCL (PF) 10 MG/2ML IJ SOLN
INTRAMUSCULAR | Status: AC
  Filled 2018-05-13: qty 2

## 2018-05-13 MED ORDER — PLASMA-LYTE 148 IV SOLN
INTRAVENOUS | Status: DC
  Filled 2018-05-13: qty 2.5

## 2018-05-13 MED ORDER — ALBUTEROL SULFATE HFA 108 (90 BASE) MCG/ACT IN AERS
INHALATION_SPRAY | RESPIRATORY_TRACT | Status: DC | PRN
  Administered 2018-05-13: 6 via RESPIRATORY_TRACT

## 2018-05-13 MED ORDER — LACTATED RINGERS IV SOLN: INTRAVENOUS | Status: DC

## 2018-05-13 MED ORDER — CHLORHEXIDINE GLUCONATE 0.12 % MT SOLN
15.0000 mL | OROMUCOSAL | Status: AC
  Administered 2018-05-13: 15 mL via OROMUCOSAL

## 2018-05-13 MED ORDER — PHENYLEPHRINE HCL-NACL 20-0.9 MG/250ML-% IV SOLN
0.0000 ug/min | INTRAVENOUS | Status: DC
  Administered 2018-05-13 (×2): 100 ug/min via INTRAVENOUS
  Administered 2018-05-14: 65 ug/min via INTRAVENOUS
  Administered 2018-05-14 (×2): 75 ug/min via INTRAVENOUS
  Administered 2018-05-14: 100 ug/min via INTRAVENOUS
  Administered 2018-05-15: 20 ug/min via INTRAVENOUS
  Filled 2018-05-13 (×7): qty 250

## 2018-05-13 MED ORDER — DOPAMINE-DEXTROSE 3.2-5 MG/ML-% IV SOLN
INTRAVENOUS | Status: AC
  Administered 2018-05-13: 3 ug/kg/min via INTRAVENOUS
  Filled 2018-05-13: qty 250

## 2018-05-13 MED ORDER — LACTATED RINGERS IV SOLN
INTRAVENOUS | Status: DC | PRN
  Administered 2018-05-13: 07:00:00 via INTRAVENOUS

## 2018-05-13 MED ORDER — MILRINONE LACTATE IN DEXTROSE 20-5 MG/100ML-% IV SOLN: INTRAVENOUS | Status: DC | PRN

## 2018-05-13 MED ORDER — NOREPINEPHRINE BITARTRATE 1 MG/ML IV SOLN
INTRAVENOUS | Status: DC | PRN
  Administered 2018-05-13: 2 ug/min via INTRAVENOUS

## 2018-05-13 MED ORDER — OXYCODONE HCL 5 MG PO TABS: 5.0000 mg | ORAL_TABLET | ORAL | Status: DC | PRN

## 2018-05-13 MED ORDER — LACTATED RINGERS IV SOLN
500.0000 mL | Freq: Once | INTRAVENOUS | Status: AC | PRN
  Administered 2018-05-13: 500 mL via INTRAVENOUS

## 2018-05-13 MED ORDER — DOCUSATE SODIUM 100 MG PO CAPS
200.0000 mg | ORAL_CAPSULE | Freq: Every day | ORAL | Status: DC
  Administered 2018-05-14 – 2018-05-19 (×5): 200 mg via ORAL
  Filled 2018-05-13 (×5): qty 2

## 2018-05-13 MED ORDER — ROCURONIUM BROMIDE 10 MG/ML (PF) SYRINGE
PREFILLED_SYRINGE | INTRAVENOUS | Status: DC | PRN
  Administered 2018-05-13: 50 mg via INTRAVENOUS

## 2018-05-13 MED ORDER — PANTOPRAZOLE SODIUM 40 MG PO TBEC
40.0000 mg | DELAYED_RELEASE_TABLET | Freq: Every day | ORAL | Status: DC
  Administered 2018-05-15 – 2018-05-19 (×5): 40 mg via ORAL
  Filled 2018-05-13 (×5): qty 1

## 2018-05-13 MED ORDER — VECURONIUM BROMIDE 10 MG IV SOLR
INTRAVENOUS | Status: DC | PRN
  Administered 2018-05-13 (×2): 2 mg via INTRAVENOUS
  Administered 2018-05-13: 4 mg via INTRAVENOUS
  Administered 2018-05-13 (×2): 2 mg via INTRAVENOUS
  Administered 2018-05-13: 1 mg via INTRAVENOUS
  Administered 2018-05-13: 5 mg via INTRAVENOUS
  Administered 2018-05-13: 2 mg via INTRAVENOUS

## 2018-05-13 MED ORDER — ALBUMIN HUMAN 5 % IV SOLN
INTRAVENOUS | Status: DC | PRN
  Administered 2018-05-13: 15:00:00 via INTRAVENOUS

## 2018-05-13 MED ORDER — LIDOCAINE 2% (20 MG/ML) 5 ML SYRINGE
INTRAMUSCULAR | Status: AC
  Filled 2018-05-13: qty 5

## 2018-05-13 MED ORDER — SODIUM CHLORIDE 0.9% FLUSH
3.0000 mL | Freq: Two times a day (BID) | INTRAVENOUS | Status: DC
  Administered 2018-05-14 – 2018-05-15 (×2): 3 mL via INTRAVENOUS

## 2018-05-13 MED ORDER — FAMOTIDINE IN NACL 20-0.9 MG/50ML-% IV SOLN
20.0000 mg | Freq: Two times a day (BID) | INTRAVENOUS | Status: AC
  Administered 2018-05-13 – 2018-05-14 (×2): 20 mg via INTRAVENOUS
  Filled 2018-05-13 (×2): qty 50

## 2018-05-13 MED ORDER — HEPARIN SODIUM (PORCINE) 1000 UNIT/ML IJ SOLN
INTRAMUSCULAR | Status: DC | PRN
  Administered 2018-05-13: 2000 [IU] via INTRAVENOUS
  Administered 2018-05-13: 21000 [IU] via INTRAVENOUS

## 2018-05-13 MED ORDER — ATORVASTATIN CALCIUM 80 MG PO TABS
80.0000 mg | ORAL_TABLET | Freq: Every day | ORAL | Status: DC
  Administered 2018-05-14 – 2018-05-18 (×5): 80 mg via ORAL
  Filled 2018-05-13 (×5): qty 1

## 2018-05-13 MED ORDER — ALBUMIN HUMAN 5 % IV SOLN
250.0000 mL | INTRAVENOUS | Status: AC | PRN
  Administered 2018-05-13 (×4): 12.5 g via INTRAVENOUS
  Filled 2018-05-13: qty 500

## 2018-05-13 MED ORDER — SODIUM CHLORIDE 0.45 % IV SOLN: INTRAVENOUS | Status: DC | PRN

## 2018-05-13 MED ORDER — SODIUM CHLORIDE 0.9 % IV SOLN
1.5000 g | Freq: Two times a day (BID) | INTRAVENOUS | Status: AC
  Administered 2018-05-13 – 2018-05-15 (×4): 1.5 g via INTRAVENOUS
  Filled 2018-05-13 (×4): qty 1.5

## 2018-05-13 MED ORDER — INSULIN ASPART 100 UNIT/ML ~~LOC~~ SOLN
0.0000 [IU] | SUBCUTANEOUS | Status: DC
  Administered 2018-05-13: 4 [IU] via SUBCUTANEOUS
  Administered 2018-05-14 – 2018-05-15 (×6): 2 [IU] via SUBCUTANEOUS

## 2018-05-13 MED ORDER — DOPAMINE-DEXTROSE 3.2-5 MG/ML-% IV SOLN
0.0000 ug/kg/min | INTRAVENOUS | Status: DC
  Administered 2018-05-13 – 2018-05-14 (×2): 3 ug/kg/min via INTRAVENOUS

## 2018-05-13 MED ORDER — METOPROLOL TARTRATE 5 MG/5ML IV SOLN: 2.5000 mg | INTRAVENOUS | Status: DC | PRN

## 2018-05-13 MED ORDER — MILRINONE LOAD VIA INFUSION
50.0000 ug/kg | Freq: Once | INTRAVENOUS | Status: AC
  Administered 2018-05-13: 3800 ug via INTRAVENOUS
  Filled 2018-05-13: qty 3.8

## 2018-05-13 MED ORDER — NITROGLYCERIN IN D5W 200-5 MCG/ML-% IV SOLN: 0.0000 ug/min | INTRAVENOUS | Status: DC

## 2018-05-13 MED ORDER — ACETAMINOPHEN 160 MG/5ML PO SOLN
1000.0000 mg | Freq: Four times a day (QID) | ORAL | Status: DC
  Administered 2018-05-13 – 2018-05-14 (×2): 1000 mg
  Filled 2018-05-13 (×2): qty 40.6

## 2018-05-13 MED ORDER — PROPOFOL 10 MG/ML IV BOLUS
INTRAVENOUS | Status: AC
  Filled 2018-05-13: qty 40

## 2018-05-13 MED ORDER — ALBUMIN HUMAN 5 % IV SOLN
12.5000 g | Freq: Once | INTRAVENOUS | Status: AC
  Administered 2018-05-13: 12.5 g via INTRAVENOUS

## 2018-05-13 MED ORDER — SODIUM CHLORIDE (PF) 0.9 % IJ SOLN
INTRAMUSCULAR | Status: AC
  Filled 2018-05-13: qty 20

## 2018-05-13 MED ORDER — CHLORHEXIDINE GLUCONATE CLOTH 2 % EX PADS: 6.0000 | MEDICATED_PAD | Freq: Every day | CUTANEOUS | Status: DC

## 2018-05-13 MED ORDER — ACETAMINOPHEN 500 MG PO TABS
1000.0000 mg | ORAL_TABLET | Freq: Four times a day (QID) | ORAL | Status: AC
  Administered 2018-05-14 – 2018-05-16 (×9): 1000 mg via ORAL
  Filled 2018-05-13 (×10): qty 2

## 2018-05-13 MED ORDER — ONDANSETRON HCL 4 MG/2ML IJ SOLN
INTRAMUSCULAR | Status: AC
  Filled 2018-05-13: qty 2

## 2018-05-13 MED ORDER — FENTANYL CITRATE (PF) 250 MCG/5ML IJ SOLN
INTRAMUSCULAR | Status: DC | PRN
  Administered 2018-05-13 (×2): 250 ug via INTRAVENOUS
  Administered 2018-05-13: 50 ug via INTRAVENOUS
  Administered 2018-05-13: 450 ug via INTRAVENOUS

## 2018-05-13 MED ORDER — 0.9 % SODIUM CHLORIDE (POUR BTL) OPTIME
TOPICAL | Status: DC | PRN
  Administered 2018-05-13: 6000 mL

## 2018-05-13 MED ORDER — SODIUM CHLORIDE 0.9 % IV SOLN: 250.0000 mL | INTRAVENOUS | Status: DC

## 2018-05-13 MED ORDER — FENTANYL CITRATE (PF) 250 MCG/5ML IJ SOLN
INTRAMUSCULAR | Status: AC
  Filled 2018-05-13: qty 20

## 2018-05-13 MED ORDER — HEMOSTATIC AGENTS (NO CHARGE) OPTIME
TOPICAL | Status: DC | PRN
  Administered 2018-05-13 (×4): 1 via TOPICAL

## 2018-05-13 MED ORDER — NOREPINEPHRINE 4 MG/250ML-% IV SOLN
0.0000 ug/min | INTRAVENOUS | Status: DC
  Filled 2018-05-13: qty 250

## 2018-05-13 MED ORDER — FENTANYL CITRATE (PF) 250 MCG/5ML IJ SOLN
INTRAMUSCULAR | Status: AC
  Filled 2018-05-13: qty 5

## 2018-05-13 MED ORDER — BISACODYL 10 MG RE SUPP: 10.0000 mg | Freq: Every day | RECTAL | Status: DC

## 2018-05-13 MED ORDER — METOPROLOL TARTRATE 25 MG/10 ML ORAL SUSPENSION: 12.5000 mg | Freq: Two times a day (BID) | ORAL | Status: DC

## 2018-05-13 MED ORDER — ACETAMINOPHEN 160 MG/5ML PO SOLN: 650.0000 mg | Freq: Once | ORAL | Status: AC

## 2018-05-13 MED ORDER — ASPIRIN EC 325 MG PO TBEC
325.0000 mg | DELAYED_RELEASE_TABLET | Freq: Every day | ORAL | Status: DC
  Administered 2018-05-14: 325 mg via ORAL
  Filled 2018-05-13: qty 1

## 2018-05-13 MED ORDER — MUPIROCIN 2 % EX OINT
1.0000 "application " | TOPICAL_OINTMENT | Freq: Two times a day (BID) | CUTANEOUS | Status: DC
  Administered 2018-05-13: 1 via NASAL
  Filled 2018-05-13: qty 22

## 2018-05-13 MED ORDER — MAGNESIUM SULFATE 4 GM/100ML IV SOLN
4.0000 g | Freq: Once | INTRAVENOUS | Status: AC
  Administered 2018-05-13: 4 g via INTRAVENOUS
  Filled 2018-05-13: qty 100

## 2018-05-13 MED ORDER — PROTAMINE SULFATE 10 MG/ML IV SOLN
INTRAVENOUS | Status: DC | PRN
  Administered 2018-05-13: 260 mg via INTRAVENOUS

## 2018-05-13 MED ORDER — SODIUM CHLORIDE 0.9 % IV SOLN
INTRAVENOUS | Status: DC
  Administered 2018-05-13 – 2018-05-14 (×2): via INTRAVENOUS

## 2018-05-13 MED ORDER — ONDANSETRON HCL 4 MG/2ML IJ SOLN: 4.0000 mg | Freq: Four times a day (QID) | INTRAMUSCULAR | Status: DC | PRN

## 2018-05-13 MED ORDER — ASPIRIN 81 MG PO CHEW: 324.0000 mg | CHEWABLE_TABLET | Freq: Every day | ORAL | Status: DC

## 2018-05-13 MED ORDER — INSULIN REGULAR BOLUS VIA INFUSION
0.0000 [IU] | Freq: Three times a day (TID) | INTRAVENOUS | Status: DC
  Filled 2018-05-13: qty 10

## 2018-05-13 MED ORDER — ETOMIDATE 2 MG/ML IV SOLN
INTRAVENOUS | Status: AC
  Filled 2018-05-13: qty 10

## 2018-05-13 MED ORDER — MIDAZOLAM HCL 5 MG/5ML IJ SOLN
INTRAMUSCULAR | Status: DC | PRN
  Administered 2018-05-13: 1 mg via INTRAVENOUS
  Administered 2018-05-13 (×2): 2 mg via INTRAVENOUS
  Administered 2018-05-13: 3 mg via INTRAVENOUS

## 2018-05-13 MED ORDER — VANCOMYCIN HCL IN DEXTROSE 1-5 GM/200ML-% IV SOLN
1000.0000 mg | Freq: Once | INTRAVENOUS | Status: AC
  Administered 2018-05-13: 1000 mg via INTRAVENOUS
  Filled 2018-05-13: qty 200

## 2018-05-13 MED ORDER — DEXMEDETOMIDINE HCL IN NACL 200 MCG/50ML IV SOLN: 0.0000 ug/kg/h | INTRAVENOUS | Status: DC

## 2018-05-13 MED ORDER — ALBUTEROL SULFATE HFA 108 (90 BASE) MCG/ACT IN AERS
INHALATION_SPRAY | RESPIRATORY_TRACT | Status: AC
  Filled 2018-05-13: qty 6.7

## 2018-05-13 MED ORDER — METOPROLOL TARTRATE 12.5 MG HALF TABLET: 12.5000 mg | ORAL_TABLET | Freq: Two times a day (BID) | ORAL | Status: DC

## 2018-05-13 MED ORDER — TRAMADOL HCL 50 MG PO TABS
50.0000 mg | ORAL_TABLET | ORAL | Status: DC | PRN
  Administered 2018-05-14: 50 mg via ORAL
  Administered 2018-05-14: 100 mg via ORAL
  Filled 2018-05-13: qty 2
  Filled 2018-05-13: qty 1

## 2018-05-13 MED ORDER — INSULIN REGULAR(HUMAN) IN NACL 100-0.9 UT/100ML-% IV SOLN: INTRAVENOUS | Status: DC

## 2018-05-13 MED ORDER — VECURONIUM BROMIDE 10 MG IV SOLR
INTRAVENOUS | Status: AC
  Filled 2018-05-13: qty 10

## 2018-05-13 MED ORDER — DEXMEDETOMIDINE HCL IN NACL 400 MCG/100ML IV SOLN
0.1000 ug/kg/h | INTRAVENOUS | Status: DC
  Filled 2018-05-13: qty 100

## 2018-05-13 MED ORDER — PLASMA-LYTE 148 IV SOLN
INTRAVENOUS | Status: DC | PRN
  Administered 2018-05-13 (×2): 500 mL via INTRAVASCULAR

## 2018-05-13 MED ORDER — SODIUM CHLORIDE (PF) 0.9 % IJ SOLN
INTRAMUSCULAR | Status: AC
  Filled 2018-05-13: qty 10

## 2018-05-13 MED ORDER — MIDAZOLAM HCL 2 MG/2ML IJ SOLN
2.0000 mg | INTRAMUSCULAR | Status: DC | PRN
  Administered 2018-05-13: 2 mg via INTRAVENOUS
  Filled 2018-05-13: qty 2

## 2018-05-13 MED ORDER — MILRINONE LACTATE IN DEXTROSE 20-5 MG/100ML-% IV SOLN
0.1000 ug/kg/min | INTRAVENOUS | Status: DC
  Administered 2018-05-13: 0.3 ug/kg/min via INTRAVENOUS
  Administered 2018-05-14 – 2018-05-15 (×2): 0.2 ug/kg/min via INTRAVENOUS
  Filled 2018-05-13 (×3): qty 100

## 2018-05-13 MED ORDER — MORPHINE SULFATE (PF) 2 MG/ML IV SOLN
1.0000 mg | INTRAVENOUS | Status: DC | PRN
  Administered 2018-05-13 – 2018-05-14 (×3): 2 mg via INTRAVENOUS
  Filled 2018-05-13 (×2): qty 1
  Filled 2018-05-13: qty 2
  Filled 2018-05-13: qty 1

## 2018-05-13 MED ORDER — SUCCINYLCHOLINE CHLORIDE 200 MG/10ML IV SOSY
PREFILLED_SYRINGE | INTRAVENOUS | Status: AC
  Filled 2018-05-13: qty 10

## 2018-05-13 MED ORDER — CALCIUM CHLORIDE 10 % IV SOLN
1.0000 g | Freq: Once | INTRAVENOUS | Status: AC
  Administered 2018-05-13: 1 g via INTRAVENOUS

## 2018-05-13 MED ORDER — PROPOFOL 10 MG/ML IV BOLUS
INTRAVENOUS | Status: DC | PRN
  Administered 2018-05-13: 40 mg via INTRAVENOUS

## 2018-05-13 MED FILL — Potassium Chloride Inj 2 mEq/ML: INTRAVENOUS | Qty: 40 | Status: AC

## 2018-05-13 MED FILL — Magnesium Sulfate Inj 50%: INTRAMUSCULAR | Qty: 10 | Status: AC

## 2018-05-13 MED FILL — Heparin Sodium (Porcine) Inj 1000 Unit/ML: INTRAMUSCULAR | Qty: 30 | Status: AC

## 2018-05-13 SURGICAL SUPPLY — 137 items
ADAPTER CARDIO PERF ANTE/RETRO (ADAPTER) ×4 IMPLANT
BAG DECANTER FOR FLEXI CONT (MISCELLANEOUS) ×3 IMPLANT
BANDAGE ACE 4X5 VEL STRL LF (GAUZE/BANDAGES/DRESSINGS) ×3 IMPLANT
BANDAGE ACE 6X5 VEL STRL LF (GAUZE/BANDAGES/DRESSINGS) ×3 IMPLANT
BASKET HEART (ORDER IN 25'S) (MISCELLANEOUS) ×1
BASKET HEART (ORDER IN 25S) (MISCELLANEOUS) ×2 IMPLANT
BLADE STERNUM SYSTEM 6 (BLADE) ×3 IMPLANT
BLADE SURG 11 STRL SS (BLADE) ×1 IMPLANT
BLADE SURG 15 STRL LF DISP TIS (BLADE) ×2 IMPLANT
BLADE SURG 15 STRL SS (BLADE) ×1
BNDG GAUZE ELAST 4 BULKY (GAUZE/BANDAGES/DRESSINGS) ×3 IMPLANT
CANISTER SUCT 3000ML PPV (MISCELLANEOUS) ×3 IMPLANT
CANN PRFSN 3/8XRT ANG TPR 14 (MISCELLANEOUS) ×2
CANNULA ARTERIAL NVNT 3/8 22FR (MISCELLANEOUS) IMPLANT
CANNULA EZ GLIDE AORTIC 21FR (CANNULA) ×3 IMPLANT
CANNULA GUNDRY RCSP 15FR (MISCELLANEOUS) ×1 IMPLANT
CANNULA PRFSN 3/8XRT ANG TPR14 (MISCELLANEOUS) IMPLANT
CANNULA SUMP PERICARDIAL (CANNULA) ×1 IMPLANT
CANNULA VEN MTL TIP RT (MISCELLANEOUS) ×1
CANNULA VENOUS MALLSGL STG 38 (MISCELLANEOUS) IMPLANT
CANNULAE VENOUS MALLSGL STG 38 (MISCELLANEOUS) ×3
CATH CPB KIT HENDRICKSON (MISCELLANEOUS) ×3 IMPLANT
CATH ROBINSON RED A/P 18FR (CATHETERS) ×4 IMPLANT
CATH THORACIC 36FR (CATHETERS) ×3 IMPLANT
CATH THORACIC 36FR RT ANG (CATHETERS) ×3 IMPLANT
CLIP FOGARTY SPRING 6M (CLIP) ×1 IMPLANT
CLIP VESOCCLUDE MED 24/CT (CLIP) IMPLANT
CLIP VESOCCLUDE SM WIDE 24/CT (CLIP) ×1 IMPLANT
CONN 1/2X1/2X1/2  BEN (MISCELLANEOUS) ×2
CONN 1/2X1/2X1/2 BEN (MISCELLANEOUS) ×2 IMPLANT
CONN 3/8X1/2 ST GISH (MISCELLANEOUS) ×8 IMPLANT
CONN Y 3/8X3/8X3/8  BEN (MISCELLANEOUS)
CONN Y 3/8X3/8X3/8 BEN (MISCELLANEOUS) IMPLANT
CONT SPEC 4OZ CLIKSEAL STRL BL (MISCELLANEOUS) ×1 IMPLANT
COUNTER NEEDLE 20 DBL MAG RED (NEEDLE) ×1 IMPLANT
COVER WAND RF STERILE (DRAPES) ×3 IMPLANT
CRADLE DONUT ADULT HEAD (MISCELLANEOUS) ×3 IMPLANT
DERMABOND ADVANCED (GAUZE/BANDAGES/DRESSINGS) ×1
DERMABOND ADVANCED .7 DNX12 (GAUZE/BANDAGES/DRESSINGS) IMPLANT
DRAPE CARDIOVASCULAR INCISE (DRAPES) ×1
DRAPE HALF SHEET 40X57 (DRAPES) ×1 IMPLANT
DRAPE SLUSH/WARMER DISC (DRAPES) ×3 IMPLANT
DRAPE SRG 135X102X78XABS (DRAPES) ×2 IMPLANT
DRSG COVADERM 4X14 (GAUZE/BANDAGES/DRESSINGS) ×3 IMPLANT
ELECT CAUTERY BLADE 6.4 (BLADE) IMPLANT
ELECT REM PT RETURN 9FT ADLT (ELECTROSURGICAL) ×6
ELECTRODE REM PT RTRN 9FT ADLT (ELECTROSURGICAL) ×4 IMPLANT
FELT TEFLON 1X6 (MISCELLANEOUS) ×6 IMPLANT
GAUZE SPONGE 4X4 12PLY STRL (GAUZE/BANDAGES/DRESSINGS) ×6 IMPLANT
GAUZE SPONGE 4X4 12PLY STRL LF (GAUZE/BANDAGES/DRESSINGS) ×2 IMPLANT
GLOVE BIO SURGEON STRL SZ 6.5 (GLOVE) ×10 IMPLANT
GLOVE BIOGEL PI IND STRL 6.5 (GLOVE) IMPLANT
GLOVE BIOGEL PI INDICATOR 6.5 (GLOVE) ×2
GLOVE SURG SIGNA 7.5 PF LTX (GLOVE) ×9 IMPLANT
GLOVE SURG SS PI 6.0 STRL IVOR (GLOVE) ×2 IMPLANT
GOWN STRL REUS W/ TWL LRG LVL3 (GOWN DISPOSABLE) ×8 IMPLANT
GOWN STRL REUS W/ TWL XL LVL3 (GOWN DISPOSABLE) ×4 IMPLANT
GOWN STRL REUS W/TWL LRG LVL3 (GOWN DISPOSABLE) ×10
GOWN STRL REUS W/TWL XL LVL3 (GOWN DISPOSABLE) ×2
HEMOSTAT POWDER SURGIFOAM 1G (HEMOSTASIS) ×9 IMPLANT
HEMOSTAT SURGICEL 2X14 (HEMOSTASIS) ×3 IMPLANT
INSERT FOGARTY XLG (MISCELLANEOUS) IMPLANT
IV CATH 18G X1.75 CATHLON (IV SOLUTION) ×1 IMPLANT
KIT BASIN OR (CUSTOM PROCEDURE TRAY) ×3 IMPLANT
KIT SUCTION CATH 14FR (SUCTIONS) ×6 IMPLANT
KIT TURNOVER KIT B (KITS) ×3 IMPLANT
KIT VASOVIEW HEMOPRO 2 VH 4000 (KITS) ×3 IMPLANT
LINE VENT (MISCELLANEOUS) ×1 IMPLANT
LOOP VESSEL SUPERMAXI WHITE (MISCELLANEOUS) ×1 IMPLANT
MARKER GRAFT CORONARY BYPASS (MISCELLANEOUS) ×9 IMPLANT
NS IRRIG 1000ML POUR BTL (IV SOLUTION) ×16 IMPLANT
PACK E OPEN HEART (SUTURE) ×3 IMPLANT
PACK OPEN HEART (CUSTOM PROCEDURE TRAY) ×3 IMPLANT
PAD ARMBOARD 7.5X6 YLW CONV (MISCELLANEOUS) ×6 IMPLANT
PAD ELECT DEFIB RADIOL ZOLL (MISCELLANEOUS) ×3 IMPLANT
PENCIL BUTTON HOLSTER BLD 10FT (ELECTRODE) ×3 IMPLANT
PUNCH AORTIC ROTATE 4.0MM (MISCELLANEOUS) ×1 IMPLANT
PUNCH AORTIC ROTATE 4.5MM 8IN (MISCELLANEOUS) IMPLANT
PUNCH AORTIC ROTATE 5MM 8IN (MISCELLANEOUS) IMPLANT
RING ANLPLS CARP-EDW PHY II 32 (Prosthesis & Implant Heart) IMPLANT
RING ANNULOPLASTY PHY II (Prosthesis & Implant Heart) ×1 IMPLANT
SET CARDIOPLEGIA MPS 5001102 (MISCELLANEOUS) ×1 IMPLANT
SOLUTION ANTI FOG 6CC (MISCELLANEOUS) ×2 IMPLANT
SUCKER WEIGHTED FLEX (MISCELLANEOUS) ×3 IMPLANT
SUT BONE WAX W31G (SUTURE) ×3 IMPLANT
SUT ETHIBOND (SUTURE) ×4 IMPLANT
SUT ETHIBOND 2 0 SH (SUTURE) ×5 IMPLANT
SUT ETHIBOND 2 0 SH 36X2 (SUTURE) ×4 IMPLANT
SUT ETHIBOND 2 0 V4 (SUTURE) ×2 IMPLANT
SUT ETHIBOND 2 0V4 GREEN (SUTURE) ×2 IMPLANT
SUT ETHIBOND 2-0 RB-1 WHT (SUTURE) ×3 IMPLANT
SUT ETHIBOND 5 0 C 1 30 (SUTURE) ×2 IMPLANT
SUT MNCRL AB 3-0 PS2 18 (SUTURE) ×1 IMPLANT
SUT MNCRL AB 4-0 PS2 18 (SUTURE) IMPLANT
SUT PROLENE 3 0 SH DA (SUTURE) ×4 IMPLANT
SUT PROLENE 3 0 SH1 36 (SUTURE) ×3 IMPLANT
SUT PROLENE 4 0 RB 1 (SUTURE) ×6
SUT PROLENE 4 0 SH DA (SUTURE) ×5 IMPLANT
SUT PROLENE 4-0 RB1 .5 CRCL 36 (SUTURE) ×4 IMPLANT
SUT PROLENE 5 0 C 1 36 (SUTURE) ×7 IMPLANT
SUT PROLENE 5 0 CC1 (SUTURE) ×3 IMPLANT
SUT PROLENE 6 0 C 1 30 (SUTURE) ×10 IMPLANT
SUT PROLENE 7 0 BV 1 (SUTURE) ×4 IMPLANT
SUT PROLENE 7 0 BV1 MDA (SUTURE) ×4 IMPLANT
SUT PROLENE 8 0 BV175 6 (SUTURE) ×2 IMPLANT
SUT SILK  1 MH (SUTURE) ×2
SUT SILK 1 MH (SUTURE) ×4 IMPLANT
SUT SILK 1 TIES 10X30 (SUTURE) ×3 IMPLANT
SUT SILK 2 0 (SUTURE) ×1
SUT SILK 2 0 SH CR/8 (SUTURE) ×6 IMPLANT
SUT SILK 2-0 18XBRD TIE 12 (SUTURE) ×2 IMPLANT
SUT SILK 3 0 SH CR/8 (SUTURE) ×3 IMPLANT
SUT SILK 4 0 (SUTURE) ×1
SUT SILK 4-0 18XBRD TIE 12 (SUTURE) ×2 IMPLANT
SUT STEEL 6MS V (SUTURE) ×3 IMPLANT
SUT STEEL STERNAL CCS#1 18IN (SUTURE) IMPLANT
SUT STEEL SZ 6 DBL 3X14 BALL (SUTURE) ×3 IMPLANT
SUT TEM PAC WIRE 2 0 SH (SUTURE) ×12 IMPLANT
SUT VIC AB 1 CTX 27 (SUTURE) ×1 IMPLANT
SUT VIC AB 1 CTX 36 (SUTURE) ×2
SUT VIC AB 1 CTX36XBRD ANBCTR (SUTURE) ×4 IMPLANT
SUT VIC AB 2-0 CT1 27 (SUTURE) ×1
SUT VIC AB 2-0 CT1 TAPERPNT 27 (SUTURE) IMPLANT
SUT VIC AB 2-0 CTX 27 (SUTURE) IMPLANT
SUT VIC AB 3-0 SH 27 (SUTURE) ×1
SUT VIC AB 3-0 SH 27X BRD (SUTURE) IMPLANT
SUT VIC AB 3-0 X1 27 (SUTURE) IMPLANT
SUT VICRYL 4-0 PS2 18IN ABS (SUTURE) IMPLANT
SYSTEM SAHARA CHEST DRAIN ATS (WOUND CARE) ×3 IMPLANT
TAPE CLOTH SURG 4X10 WHT LF (GAUZE/BANDAGES/DRESSINGS) ×1 IMPLANT
TAPE PAPER 2X10 WHT MICROPORE (GAUZE/BANDAGES/DRESSINGS) ×1 IMPLANT
TOWEL GREEN STERILE (TOWEL DISPOSABLE) ×3 IMPLANT
TOWEL GREEN STERILE FF (TOWEL DISPOSABLE) ×3 IMPLANT
TRAY FOLEY SLVR 16FR TEMP STAT (SET/KITS/TRAYS/PACK) ×3 IMPLANT
TUBING INSUFFLATION (TUBING) ×3 IMPLANT
UNDERPAD 30X30 (UNDERPADS AND DIAPERS) ×3 IMPLANT
WATER STERILE IRR 1000ML POUR (IV SOLUTION) ×6 IMPLANT

## 2018-05-13 NOTE — Anesthesia Postprocedure Evaluation (Signed)
Anesthesia Post Note  Patient: Michael Banks  Procedure(s) Performed: CORONARY ARTERY BYPASS GRAFTING (CABG) times _five__ using left internal mammary artery and right  greater saphenous vein harvested endoscopically. (N/A Chest) Mitral Valve Repair (N/A ) TRANSESOPHAGEAL ECHOCARDIOGRAM (TEE) (N/A )     Patient location during evaluation: SICU Anesthesia Type: General Level of consciousness: sedated Pain management: pain level controlled Vital Signs Assessment: post-procedure vital signs reviewed and stable Respiratory status: patient remains intubated per anesthesia plan Cardiovascular status: stable Postop Assessment: no apparent nausea or vomiting Anesthetic complications: no    Last Vitals:  Vitals:   05/13/18 0442 05/13/18 1619  BP: (!) 102/57   Pulse: 83   Resp: 18 12  Temp: (!) 36.3 C   SpO2: 95%     Last Pain:  Vitals:   05/13/18 0442  TempSrc: Oral  PainSc:                  Myla Mauriello DAVID

## 2018-05-13 NOTE — Progress Notes (Signed)
Rapid wean started.  

## 2018-05-13 NOTE — Anesthesia Procedure Notes (Signed)
Arterial Line Insertion Start/End11/22/2019 6:35 AM, 05/13/2018 6:50 AM Performed by: Dorie RankQuinn, Ugochukwu Chichester M, CRNA, CRNA  Patient location: Pre-op. Preanesthetic checklist: patient identified, IV checked, site marked, risks and benefits discussed, surgical consent, monitors and equipment checked, pre-op evaluation, timeout performed and anesthesia consent Lidocaine 1% used for infiltration and patient sedated Left, radial was placed Catheter size: 20 Fr Hand hygiene performed  and maximum sterile barriers used   Attempts: 1 Procedure performed without using ultrasound guided technique. Following insertion, dressing applied and Biopatch. Post procedure assessment: normal and unchanged  Patient tolerated the procedure well with no immediate complications. Additional procedure comments: Performed without complication by Clydene LamingAlex Holton, SRNA.

## 2018-05-13 NOTE — Anesthesia Procedure Notes (Signed)
Central Venous Catheter Insertion Performed by: Lewie LoronGermeroth, Kavan Devan, MD, anesthesiologist Start/End11/22/2019 6:30 AM, 05/13/2018 6:40 AM Patient location: Pre-op. Preanesthetic checklist: patient identified, IV checked, site marked, risks and benefits discussed, surgical consent, monitors and equipment checked, pre-op evaluation, timeout performed and anesthesia consent Position: Trendelenburg Lidocaine 1% used for infiltration and patient sedated Hand hygiene performed , maximum sterile barriers used  and Seldinger technique used Catheter size: 8.5 Fr Total catheter length 16. Central line was placed.Sheath introducer Swan type:thermodilution PA Cath depth:47 Procedure performed using ultrasound guided technique. Ultrasound Notes:anatomy identified, needle tip was noted to be adjacent to the nerve/plexus identified, no ultrasound evidence of intravascular and/or intraneural injection and image(s) printed for medical record Attempts: 1 Following insertion, line sutured, dressing applied and Biopatch. Post procedure assessment: blood return through all ports, free fluid flow and no air  Patient tolerated the procedure well with no immediate complications.

## 2018-05-13 NOTE — Brief Op Note (Signed)
05/03/2018 - 05/13/2018  9:41 AM  PATIENT:  Michael Banks  67 y.o. male  PRE-OPERATIVE DIAGNOSIS:  CAD MR  POST-OPERATIVE DIAGNOSIS:  CAD, MR   PROCEDURE:  Procedure(s): CORONARY ARTERY BYPASS GRAFTING (CABG) times _five__ using left internal mammary artery and right  greater saphenous vein harvested endoscopically. (N/A) Mitral Valve Repair (N/A) TRANSESOPHAGEAL ECHOCARDIOGRAM (TEE) (N/A)  LIMA to LAD SVG to OM1 and OM2 SVG to Diag 1 SVG to PDA  SURGEON:  Surgeon(s) and Role:    * Loreli SlotHendrickson, Steven C, MD - Primary  PHYSICIAN ASSISTANT:  Jari Favreessa Conte, PA-C   ANESTHESIA:   general  EBL:  1100 mL   BLOOD ADMINISTERED:none  DRAINS: ROUTINE   LOCAL MEDICATIONS USED:  BUPIVICAINE  and NONE  SPECIMEN:  No Specimen  DISPOSITION OF SPECIMEN:  N/A  COUNTS:  YES  DICTATION: .Dragon Dictation  PLAN OF CARE: Admit to inpatient   PATIENT DISPOSITION:  PACU - hemodynamically stable.   Delay start of Pharmacological VTE agent (>24hrs) due to surgical blood loss or risk of bleeding: yes

## 2018-05-13 NOTE — Interval H&P Note (Signed)
History and Physical Interval Note:  05/13/2018 7:21 AM  Michael ShutterPhilip E Kolle  has presented today for surgery, with the diagnosis of CAD MR  The various methods of treatment have been discussed with the patient and family. After consideration of risks, benefits and other options for treatment, the patient has consented to  Procedure(s): CORONARY ARTERY BYPASS GRAFTING (CABG) (N/A) MITRAL VALVE REPAIR (MVR) OR REPLACEMENT (N/A) TRANSESOPHAGEAL ECHOCARDIOGRAM (TEE) (N/A) as a surgical intervention .  The patient's history has been reviewed, patient examined, no change in status, stable for surgery.  I have reviewed the patient's chart and labs.  Questions were answered to the patient's satisfaction.     Loreli SlotSteven C Jonanthan Bolender

## 2018-05-13 NOTE — Progress Notes (Signed)
[facility] BRIEF SICU PROGRESS NOTE  Day of Surgery  S/P Procedure(s) (LRB): CORONARY ARTERY BYPASS GRAFTING (CABG) times _five__ using left internal mammary artery and right  greater saphenous vein harvested endoscopically. (N/A) Mitral Valve Repair (N/A) TRANSESOPHAGEAL ECHOCARDIOGRAM (TEE) (N/A)   Sedated on vent AAI paced w/ MAP 55-60, PA pressures relatively low, cardiac index 1.8 on milrinone and max dose Neo O2 sats 99-100% Chest tube output low UOP 95-125 mL/hr Labs okay  Plan: Still needs volume, somewhat vasodilated.  Will add low dose dopamine  Michael Nailslarence H Hailley Byers, MD 05/13/2018 8:52 PM

## 2018-05-13 NOTE — Progress Notes (Signed)
  Echocardiogram 2D Echocardiogram has been performed.  Celene SkeenVijay  Kimbree Casanas 05/13/2018, 8:58 AM

## 2018-05-13 NOTE — Anesthesia Preprocedure Evaluation (Signed)
Anesthesia Evaluation  Patient identified by MRN, date of birth, ID band Patient awake    Reviewed: Allergy & Precautions, NPO status , Patient's Chart, lab work & pertinent test results  Airway Mallampati: I  TM Distance: >3 FB Neck ROM: Full    Dental   Pulmonary former smoker,    Pulmonary exam normal        Cardiovascular + CAD  Normal cardiovascular exam     Neuro/Psych    GI/Hepatic   Endo/Other    Renal/GU      Musculoskeletal   Abdominal   Peds  Hematology   Anesthesia Other Findings   Reproductive/Obstetrics                             Anesthesia Physical Anesthesia Plan  ASA: III  Anesthesia Plan: General   Post-op Pain Management:    Induction: Intravenous  PONV Risk Score and Plan: 2  Airway Management Planned: Oral ETT  Additional Equipment: Arterial line, CVP, PA Cath, TEE, 3D TEE and Ultrasound Guidance Line Placement  Intra-op Plan:   Post-operative Plan: Post-operative intubation/ventilation  Informed Consent: I have reviewed the patients History and Physical, chart, labs and discussed the procedure including the risks, benefits and alternatives for the proposed anesthesia with the patient or authorized representative who has indicated his/her understanding and acceptance.     Plan Discussed with: CRNA and Surgeon  Anesthesia Plan Comments:         Anesthesia Quick Evaluation

## 2018-05-13 NOTE — Transfer of Care (Signed)
Immediate Anesthesia Transfer of Care Note  Patient: [name] E Hennigan  Procedure(s) Performed: CORONARY ARTERY BYPASS GRAFTING (CABG) times _five__ using left internal mammary artery and right  greater saphenous vein harvested endoscopically. (N/A Chest) Mitral Valve Repair (N/A ) TRANSESOPHAGEAL ECHOCARDIOGRAM (TEE) (N/A )  Patient Location: ICU  Anesthesia Type:General  Level of Consciousness: sedated, unresponsive and Patient remains intubated per anesthesia plan  Airway & Oxygen Therapy: Patient remains intubated per anesthesia plan and Patient placed on Ventilator (see vital sign flow sheet for setting)  Post-op Assessment: Report given to RN and Post -op Vital signs reviewed and stable  Post vital signs: Reviewed and stable  Last Vitals:  Vitals Value Taken Time  BP    Temp 35.9 C 05/13/2018  4:31 PM  Pulse 89 05/13/2018  4:31 PM  Resp 12 05/13/2018  4:31 PM  SpO2 98 % 05/13/2018  4:31 PM  Vitals shown include unvalidated device data.  Last Pain:  Vitals:   05/13/18 0442  TempSrc: Oral  PainSc:          Complications: No apparent anesthesia complications

## 2018-05-13 NOTE — Anesthesia Procedure Notes (Signed)
Central Venous Catheter Insertion Performed by: Lewie LoronGermeroth, Deaken Jurgens, MD, anesthesiologist Start/End11/22/2019 6:40 AM, 05/13/2018 6:45 AM Patient location: Pre-op. Preanesthetic checklist: patient identified, IV checked, site marked, risks and benefits discussed, surgical consent, monitors and equipment checked, pre-op evaluation, timeout performed and anesthesia consent Hand hygiene performed  and maximum sterile barriers used  PA cath was placed.Swan type:thermodilution Procedure performed without using ultrasound guided technique. Attempts: 1 Patient tolerated the procedure well with no immediate complications.

## 2018-05-13 NOTE — Progress Notes (Signed)
Pt did not meet parameters to extubate. Pt placed back on full support.

## 2018-05-13 NOTE — Anesthesia Procedure Notes (Signed)
Procedure Name: Intubation Date/Time: 05/13/2018 7:47 AM Performed by: Lillia Abed, MD Pre-anesthesia Checklist: Patient identified, Emergency Drugs available, Suction available and Patient being monitored Patient Re-evaluated:Patient Re-evaluated prior to induction Oxygen Delivery Method: Circle system utilized Preoxygenation: Pre-oxygenation with 100% oxygen Induction Type: IV induction and Cricoid Pressure applied Ventilation: Mask ventilation without difficulty Laryngoscope Size: 4 and Mac Grade View: Grade I Tube type: Oral Tube size: 8.0 mm Number of attempts: 1 Airway Equipment and Method: Stylet Placement Confirmation: ETT inserted through vocal cords under direct vision,  positive ETCO2 and breath sounds checked- equal and bilateral Secured at: 21 cm Tube secured with: Tape Dental Injury: Teeth and Oropharynx as per pre-operative assessment

## 2018-05-14 ENCOUNTER — Inpatient Hospital Stay (HOSPITAL_COMMUNITY): Payer: Medicare HMO

## 2018-05-14 DIAGNOSIS — I255 Ischemic cardiomyopathy: Secondary | ICD-10-CM

## 2018-05-14 LAB — BASIC METABOLIC PANEL
Anion gap: 7 (ref 5–15)
BUN: 7 mg/dL — AB (ref 8–23)
CALCIUM: 8.5 mg/dL — AB (ref 8.9–10.3)
CO2: 19 mmol/L — ABNORMAL LOW (ref 22–32)
CREATININE: 0.8 mg/dL (ref 0.61–1.24)
Chloride: 109 mmol/L (ref 98–111)
GFR calc Af Amer: >60 mL/min (ref >60)
Glucose, Bld: 144 mg/dL — ABNORMAL HIGH (ref 70–99)
Potassium: 4.4 mmol/L (ref 3.5–5.1)
SODIUM: 135 mmol/L (ref 135–145)

## 2018-05-14 LAB — CBC
HCT: 30.8 % — ABNORMAL LOW (ref 39.0–52.0)
HCT: 32.8 % — ABNORMAL LOW (ref 39.0–52.0)
HEMOGLOBIN: 9.9 g/dL — AB (ref 13.0–17.0)
Hemoglobin: 9.8 g/dL — ABNORMAL LOW (ref 13.0–17.0)
MCH: 30 pg (ref 26.0–34.0)
MCH: 29.3 pg (ref 26.0–34.0)
MCHC: 31.8 g/dL (ref 30.0–36.0)
MCHC: 30.2 g/dL (ref 30.0–36.0)
MCV: 94.2 fL (ref 80.0–100.0)
MCV: 97 fL (ref 80.0–100.0)
NRBC: 0 % (ref 0.0–0.2)
PLATELETS: 158 10*3/uL (ref 150–400)
Platelets: 161 10*3/uL (ref 150–400)
RBC: 3.27 MIL/uL — ABNORMAL LOW (ref 4.22–5.81)
RBC: 3.38 MIL/uL — ABNORMAL LOW (ref 4.22–5.81)
RDW: 13.9 % (ref 11.5–15.5)
RDW: 14.1 % (ref 11.5–15.5)
WBC: 11 10*3/uL — ABNORMAL HIGH (ref 4.0–10.5)
WBC: 12.6 10*3/uL — AB (ref 4.0–10.5)
nRBC: 0 % (ref 0.0–0.2)

## 2018-05-14 LAB — POCT I-STAT, CHEM 8
BUN: 7 mg/dL — AB (ref 8–23)
CHLORIDE: 106 mmol/L (ref 98–111)
Calcium, Ion: 1.2 mmol/L (ref 1.15–1.40)
Creatinine, Ser: 0.6 mg/dL — ABNORMAL LOW (ref 0.61–1.24)
Glucose, Bld: 173 mg/dL — ABNORMAL HIGH (ref 70–99)
HEMATOCRIT: 30 % — AB (ref 39.0–52.0)
Hemoglobin: 10.2 g/dL — ABNORMAL LOW (ref 13.0–17.0)
POTASSIUM: 4.4 mmol/L (ref 3.5–5.1)
Sodium: 138 mmol/L (ref 135–145)
TCO2: 22 mmol/L (ref 22–32)

## 2018-05-14 LAB — POCT I-STAT 3, ART BLOOD GAS (G3+)
ACID-BASE DEFICIT: 4 mmol/L — AB (ref 0.0–2.0)
ACID-BASE DEFICIT: 6 mmol/L — AB (ref 0.0–2.0)
Acid-base deficit: 7 mmol/L — ABNORMAL HIGH (ref 0.0–2.0)
Bicarbonate: 20.3 mmol/L (ref 20.0–28.0)
Bicarbonate: 19.2 mmol/L — ABNORMAL LOW (ref 20.0–28.0)
Bicarbonate: 17.9 mmol/L — ABNORMAL LOW (ref 20.0–28.0)
O2 SAT: 96 %
O2 Saturation: 95 %
O2 Saturation: 95 %
PCO2 ART: 31.6 mmHg — AB (ref 32.0–48.0)
PH ART: 7.365 (ref 7.350–7.450)
Patient temperature: 37.5
TCO2: 21 mmol/L — AB (ref 22–32)
TCO2: 20 mmol/L — ABNORMAL LOW (ref 22–32)
TCO2: 19 mmol/L — ABNORMAL LOW (ref 22–32)
pCO2 arterial: 36.6 mmHg (ref 32.0–48.0)
pCO2 arterial: 34.6 mmHg (ref 32.0–48.0)
pH, Arterial: 7.357 (ref 7.350–7.450)
pH, Arterial: 7.354 (ref 7.350–7.450)
pO2, Arterial: 85 mmHg (ref 83.0–108.0)
pO2, Arterial: 81 mmHg — ABNORMAL LOW (ref 83.0–108.0)
pO2, Arterial: 81 mmHg — ABNORMAL LOW (ref 83.0–108.0)

## 2018-05-14 LAB — GLUCOSE, CAPILLARY
GLUCOSE-CAPILLARY: 110 mg/dL — AB (ref 70–99)
GLUCOSE-CAPILLARY: 127 mg/dL — AB (ref 70–99)
Glucose-Capillary: 129 mg/dL — ABNORMAL HIGH (ref 70–99)
Glucose-Capillary: 153 mg/dL — ABNORMAL HIGH (ref 70–99)

## 2018-05-14 LAB — COOXEMETRY PANEL
Carboxyhemoglobin: 1.4 % (ref 0.5–1.5)
Methemoglobin: 1.8 % — ABNORMAL HIGH (ref 0.0–1.5)
O2 SAT: 61.8 %
Total hemoglobin: 10 g/dL — ABNORMAL LOW (ref 12.0–16.0)

## 2018-05-14 LAB — PREPARE PLATELET PHERESIS: UNIT DIVISION: 0

## 2018-05-14 LAB — BPAM PLATELET PHERESIS
Blood Product Expiration Date: 201911232359
ISSUE DATE / TIME: 201911221450
UNIT TYPE AND RH: 9500

## 2018-05-14 LAB — MAGNESIUM
MAGNESIUM: 2.4 mg/dL (ref 1.7–2.4)
MAGNESIUM: 2.3 mg/dL (ref 1.7–2.4)

## 2018-05-14 LAB — CREATININE, SERUM: CREATININE: 0.85 mg/dL (ref 0.61–1.24)

## 2018-05-14 MED ORDER — WARFARIN SODIUM 2.5 MG PO TABS
2.5000 mg | ORAL_TABLET | Freq: Every day | ORAL | Status: DC
  Administered 2018-05-14 – 2018-05-15 (×2): 2.5 mg via ORAL
  Filled 2018-05-14 (×2): qty 1

## 2018-05-14 MED ORDER — ENOXAPARIN SODIUM 40 MG/0.4ML ~~LOC~~ SOLN
40.0000 mg | Freq: Every day | SUBCUTANEOUS | Status: DC
  Administered 2018-05-15: 40 mg via SUBCUTANEOUS
  Filled 2018-05-14: qty 0.4

## 2018-05-14 MED ORDER — PATIENT'S GUIDE TO USING COUMADIN BOOK
Freq: Once | Status: AC
  Administered 2018-05-14: 18:00:00
  Filled 2018-05-14: qty 1

## 2018-05-14 MED ORDER — SODIUM CHLORIDE 0.9% FLUSH
10.0000 mL | Freq: Two times a day (BID) | INTRAVENOUS | Status: DC
  Administered 2018-05-16: 10 mL

## 2018-05-14 MED ORDER — ONDANSETRON HCL 4 MG/2ML IJ SOLN: 4.0000 mg | Freq: Four times a day (QID) | INTRAMUSCULAR | Status: DC | PRN

## 2018-05-14 MED ORDER — ONDANSETRON HCL 4 MG/2ML IJ SOLN
4.0000 mg | Freq: Four times a day (QID) | INTRAMUSCULAR | Status: DC | PRN
  Administered 2018-05-14 – 2018-05-15 (×2): 4 mg via INTRAVENOUS
  Filled 2018-05-14 (×2): qty 2

## 2018-05-14 MED ORDER — CHLORHEXIDINE GLUCONATE CLOTH 2 % EX PADS
6.0000 | MEDICATED_PAD | Freq: Every day | CUTANEOUS | Status: DC
  Administered 2018-05-14 – 2018-05-16 (×3): 6 via TOPICAL

## 2018-05-14 MED ORDER — ENOXAPARIN (LOVENOX) PATIENT EDUCATION KIT
PACK | Freq: Once | Status: DC
  Filled 2018-05-14: qty 1

## 2018-05-14 MED ORDER — SODIUM CHLORIDE 0.9% FLUSH: 10.0000 mL | INTRAVENOUS | Status: DC | PRN

## 2018-05-14 MED ORDER — WARFARIN - PHYSICIAN DOSING INPATIENT
Freq: Every day | Status: DC
  Administered 2018-05-14 – 2018-05-15 (×2)

## 2018-05-14 NOTE — Progress Notes (Signed)
Paged Dr. Cornelius Moraswen regarding patient BP with MAPs 55-60. Neo gtt on max, pacer changed to AAI 90. Orders to give albumin x1. Physician to round on patient soon.

## 2018-05-14 NOTE — Progress Notes (Signed)
[address]       [place] [address]             [phone]        CARDIOTHORACIC SURGERY PROGRESS NOTE   R1 Day Post-Op Procedure(s) (LRB): CORONARY ARTERY BYPASS GRAFTING (CABG) times _five__ using left internal mammary artery and right  greater saphenous vein harvested endoscopically. (N/A) Mitral Valve Repair (N/A) TRANSESOPHAGEAL ECHOCARDIOGRAM (TEE) (N/A)  Subjective: Looks good.  Mild soreness in chest.  No SOB.  Intermittent nausea.  Reports no BM last 7 days prior to surgery  Objective: Vital signs: BP Readings from Last 1 Encounters:  05/14/18 96/60   Pulse Readings from Last 1 Encounters:  05/14/18 89   Resp Readings from Last 1 Encounters:  05/14/18 15   Temp Readings from Last 1 Encounters:  05/14/18 98.1 F (36.7 C)    Hemodynamics: PAP: (32-46)/(15-26) 39/17 CO:  [3 L/min-6 L/min] 4 L/min CI:  [1.5 L/min/m2-3.1 L/min/m2] 2.1 L/min/m2  Physical Exam:  Rhythm:   sinus  Breath sounds: clear  Heart sounds:  RRR w/out murmur  Incisions:  Dressings dry, intact  Abdomen:  Soft, non-distended, non-tender  Extremities:  Warm, well-perfused  Chest tubes:  low volume thin serosanguinous output, no air leak    Intake/Output from previous day: 11/22 0701 - 11/23 0700 In: 7933.6 [I.V.:4181; Blood:255; NG/GT:50; IV Piggyback:3447.6] Out: 9629 [Urine:2520; Blood:1100; Chest Tube:265] Intake/Output this shift: Total I/O In: 474.8 [P.O.:120; I.V.:354.8] Out: 225 [Urine:105; Chest Tube:120]  Lab Results:  CBC: Recent Labs    05/13/18 2044 05/13/18 2051 05/14/18 0343  WBC 11.0*  --  11.0*  HGB 9.8* 9.2* 9.8*  HCT 30.2* 27.0* 30.8*  PLT 154  --  158    BMET:  Recent Labs    05/13/18 0522  05/13/18 2051 05/14/18 0343  NA 135   < > 136 135  K 3.9   < > 4.7 4.4  CL 103   < > 110 109  CO2 23  --   --  19*  GLUCOSE 124*   < > 102* 144*  BUN 7*   < > 6* 7*  CREATININE 0.94   < > 0.60* 0.80  CALCIUM 8.9  --   --  8.5*   < > = values in this interval not displayed.     PT/INR:   Recent Labs    05/13/18 1614  LABPROT 21.0*  INR 1.83    CBG (last 3)  Recent Labs    05/13/18 2006 05/13/18 2324 05/14/18 0808  GLUCAP 92 157* 110*    ABG    Component Value Date/Time   PHART 7.365 05/14/2018 0705   PCO2ART 31.6 (L) 05/14/2018 0705   PO2ART 81.0 (L) 05/14/2018 0705   HCO3 17.9 (L) 05/14/2018 0705   TCO2 19 (L) 05/14/2018 0705   ACIDBASEDEF 7.0 (H) 05/14/2018 0705   O2SAT 95.0 05/14/2018 0705    CXR: PORTABLE CHEST 1 VIEW  COMPARISON:  Chest radiograph 05/13/2018  FINDINGS: PA catheter tip projects over the main pulmonary artery. Interval extubation and removal of enteric tube. Mediastinal drains in place. Left chest tube in place. Monitoring leads overlie the patient. Stable cardiomegaly status post median sternotomy. Similar-appearing bilateral mid lower lung heterogeneous opacities. Small left pleural effusion.  IMPRESSION: Support apparatus as above.  Similar-appearing mid lower lung opacities with small left pleural effusion. Possible left lower lobe atelectasis.   Electronically Signed   By: Annia Belt M.D.   On:  05/14/2018 08:42   EKG: NSR vs junctional w/out acute ischemic changes, old MI   Assessment/Plan: S/P Procedure(s) (LRB): CORONARY ARTERY BYPASS GRAFTING (CABG) times _five__ using left internal mammary artery and right  greater saphenous vein harvested endoscopically. (N/A) Mitral Valve Repair (N/A) TRANSESOPHAGEAL ECHOCARDIOGRAM (TEE) (N/A)  Overall stable POD1 Maintaining AAI paced rhythm w/ stable hemodynamics on Neo and dopamine for BP support w/ milrinone 0.3 Breathing comfortably w/ O2 sats 95% on 4 L/min, CXR looks okay Chronic systolic CHF with expected post-op volume excess, weigt 9 kg > preop Expected post op acute blood loss anemia, Hgb 9.8 stable Expected post op atelectasis, mild   Mobilize  Wean milrinone very slowly  Wean  pressors as tolerated  D/C lines  D/C tubes later today or tomorrow, depending on output  Start Coumadin slowly   Purcell Nailslarence H Dalena Plantz, MD 05/14/2018 10:45 AM

## 2018-05-14 NOTE — Progress Notes (Signed)
Progress Note  Patient Name: Michael Banks Date of Encounter: 05/14/2018  Primary Cardiologist: Tobias Alexander, MD   Subjective   Extubated; chest sore; no dyspnea  Inpatient Medications    Scheduled Meds: . acetaminophen  1,000 mg Oral Q6H   Or  . acetaminophen (TYLENOL) oral liquid 160 mg/5 mL  1,000 mg Per Tube Q6H  . aspirin EC  325 mg Oral Daily   Or  . aspirin  324 mg Per Tube Daily  . atorvastatin  80 mg Oral q1800  . bisacodyl  10 mg Oral Daily   Or  . bisacodyl  10 mg Rectal Daily  . docusate sodium  200 mg Oral Daily  . insulin aspart  0-24 Units Subcutaneous Q4H  . metoprolol tartrate  12.5 mg Oral BID   Or  . metoprolol tartrate  12.5 mg Per Tube BID  . [START ON 05/15/2018] pantoprazole  40 mg Oral Daily  . sodium chloride flush  3 mL Intravenous Q12H   Continuous Infusions: . sodium chloride 20 mL/hr at 05/14/18 0700  . sodium chloride    . sodium chloride 10 mL/hr at 05/14/18 0304  . cefUROXime (ZINACEF)  IV Stopped (05/14/18 0600)  . dexmedetomidine (PRECEDEX) IV infusion Stopped (05/14/18 0503)  . DOPamine 3 mcg/kg/min (05/14/18 0700)  . famotidine (PEPCID) IV Stopped (05/13/18 2133)  . lactated ringers    . lactated ringers 20 mL/hr at 05/14/18 0700  . milrinone 0.3 mcg/kg/min (05/14/18 0700)  . nitroGLYCERIN    . phenylephrine (NEO-SYNEPHRINE) Adult infusion 100 mcg/min (05/14/18 0715)   PRN Meds: sodium chloride, metoprolol tartrate, midazolam, morphine injection, oxyCODONE, sodium chloride flush, traMADol   Vital Signs    Vitals:   05/14/18 0645 05/14/18 0700 05/14/18 0715 05/14/18 0730  BP: 92/62 98/66 (!) 89/62 95/61  Pulse: 72 86 89 89  Resp: 15 20 16 15   Temp: 99.5 F (37.5 C) 99.5 F (37.5 C) 99.5 F (37.5 C) 99.3 F (37.4 C)  TempSrc:      SpO2: 91% 94% 96% 99%  Weight:      Height:        Intake/Output Summary (Last 24 hours) at 05/14/2018 0815 Last data filed at 05/14/2018 0730 Gross per 24 hour  Intake 7933.6  ml  Output 3985 ml  Net 3948.6 ml   Filed Weights   05/12/18 0611 05/13/18 0442 05/14/18 0500  Weight: 75.9 kg 75.5 kg 83.7 kg    Telemetry    Atrial paced; 5 beats NSVT - Personally Reviewed   Physical Exam   GEN: No acute distress.   Neck: supple Cardiac: RRR, positive rub Respiratory: Clear to auscultation bilaterally; s/p sternotomy GI: Soft, nontender, non-distended  MS: No edema Neuro:  Nonfocal  Psych: Normal affect   Labs    Chemistry Recent Labs  Lab 05/12/18 1629 05/13/18 0522  05/13/18 1507 05/13/18 1619 05/13/18 2044 05/13/18 2051 05/14/18 0343  NA 136 135   < > 137 139  --  136 135  K 4.1 3.9   < > 4.4 4.4  --  4.7 4.4  CL 102 103   < > 104  --   --  110 109  CO2 25 23  --   --   --   --   --  19*  GLUCOSE 121* 124*   < > 115* 115*  --  102* 144*  BUN 7* 7*   < > 6*  --   --  6* 7*  CREATININE 1.00  0.94   < > 0.60*  --  0.79 0.60* 0.80  CALCIUM 8.9 8.9  --   --   --   --   --  8.5*  PROT 6.6  --   --   --   --   --   --   --   ALBUMIN 3.2*  --   --   --   --   --   --   --   AST 24  --   --   --   --   --   --   --   ALT 30  --   --   --   --   --   --   --   ALKPHOS 50  --   --   --   --   --   --   --   BILITOT 1.5*  --   --   --   --   --   --   --   GFRNONAA >60 >60  --   --   --  >60  --  >60  GFRAA >60 >60  --   --   --  >60  --  >60  ANIONGAP 9 9  --   --   --   --   --  7   < > = values in this interval not displayed.     Hematology Recent Labs  Lab 05/13/18 1614  05/13/18 2044 05/13/18 2051 05/14/18 0343  WBC 10.9*  --  11.0*  --  11.0*  RBC 3.36*  --  3.19*  --  3.27*  HGB 10.2*   < > 9.8* 9.2* 9.8*  HCT 31.6*   < > 30.2* 27.0* 30.8*  MCV 94.0  --  94.7  --  94.2  MCH 30.4  --  30.7  --  30.0  MCHC 32.3  --  32.5  --  31.8  RDW 13.5  --  13.7  --  13.9  PLT 147*  --  154  --  158   < > = values in this interval not displayed.    Radiology    Dg Chest 2 View  Result Date: 05/12/2018 CLINICAL DATA:  Preop CABG.  EXAM: CHEST - 2 VIEW COMPARISON:  Radiograph 05/10/2018 FINDINGS: 18 mildly enlarged cardiac silhouette. Mild central venous congestion interstitial edema pattern. Findings similar comparison exam. Lungs mildly hyperinflated. No pneumothorax. IMPRESSION: 1. Mild interstitial edema pattern similar  to prior. 2. Hyperinflated lungs. Electronically Signed   By: Genevive BiStewart  Edmunds M.D.   On: 05/12/2018 21:14   Dg Chest Port 1 View  Result Date: 05/13/2018 CLINICAL DATA:  Postop EXAM: PORTABLE CHEST 1 VIEW COMPARISON:  Yesterday FINDINGS: Endotracheal tube tip just below the clavicular heads. An orogastric tube at least reaches the stomach. Swan-Ganz catheter from right IJ approach with tip at the right main pulmonary artery. Thoracic drains present. Interstitial and bilateral airspace opacity consistent with edema. No definite effusion. No pneumothorax. Sharply marginated opacity overlapping the heart, likely left lower lobe collapse. Cardiomegaly. Interval changes of CABG and mitral valve repair. IMPRESSION: 1. Unremarkable hardware positioning. 2. Pulmonary edema and probable left lower lobe collapse. Electronically Signed   By: Marnee SpringJonathon  Watts M.D.   On: 05/13/2018 16:47    Patient Profile     67 y.o. male with hypertension, hyperlipidemia, tobacco abuse admitted with congestive heart failure.  Echocardiogram showed severely reduced LV function and moderate mitral regurgitation.  Cardiac catheterization revealed severe coronary disease and severe mitral regurgitation.  Myocardial viability study revealed viability in 12 out of 17 segments.  Patient is now status post coronary artery bypass and graft and mitral valve repair.  Assessment & Plan    1 coronary artery disease status post coronary artery bypass and graft-plan to continue aspirin and statin.  Cardiac output/cardiac index remains low; PAD 18.  Continue milrinone.  Continue NeoSynephrine and dopamine.  Wean as tolerated.  Patient is now status  post extubation.  2 ischemic cardiomyopathy-will add ARB or entresto and beta-blocker later as blood pressure allows.  3 status post mitral valve repair  4 hyperlipidemia-continue statin.  For questions or updates, please contact [facility] Please consult [email or web address] for contact info under        Signed, Olga Millers, MD  05/14/2018, 8:15 AM

## 2018-05-14 NOTE — Progress Notes (Signed)
Called Dr. Cornelius Moraswen regarding pt's rapid wean ABG. Parameters performed, NIF -15, V 1.1. Patient placed back on full support and precedex gtt restarted. Orders given for 1 amp Bicarb, and to keep patient ventilated until AM, then reassess ability to wean. Will initiate orders and continue to monitor patient closely.

## 2018-05-14 NOTE — Procedures (Signed)
Extubation Procedure Note  Patient Details:   Name: [name] E Schillo DOB: September 15, 1950 MRN: [id number]   Airway Documentation:    Vent end date: 05/14/18 Vent end time: 0600   Evaluation  O2 sats: stable throughout Complications: No apparent complications Patient did tolerate procedure well. Bilateral Breath Sounds: Clear, Diminished   Yes   NIF -22 VC: 1.6L. Pt extubated per Rapid wean protocol. Pt placed on 3L [place] and spo2 96%. Clear BBS. No stridor heard.   Berton BonBlanca E Limon Nunez 05/14/2018, 6:08 AM

## 2018-05-14 NOTE — Op Note (Signed)
NAME: Michael Banks, Michael Banks MEDICAL RECORD ZO:[id number] ACCOUNT [id number] DATE OF BIRTH:[birth year] FACILITY: [facility] LOCATION: [facility] PHYSICIAN:Jayshon Dommer C. Amyjo Mizrachi, MD  OPERATIVE REPORT  DATE OF PROCEDURE:  05/13/2018  PREOPERATIVE DIAGNOSIS:  Severe 3-vessel coronary disease with ischemic cardiomyopathy and severe mitral regurgitation with class IV congestive heart failure.  POSTOPERATIVE DIAGNOSIS:  Severe 3-vessel coronary disease with ischemic cardiomyopathy and severe mitral regurgitation with class IV congestive heart failure.  PROCEDURE:  Median sternotomy, extracorporeal circulation Coronary artery bypass grafting x 5  Left internal mammary artery to the left anterior descending,  Sequential saphenous vein graft to obtuse marginals 1 and 2,  Saphenous vein graft to first  Diagonal,  Saphenous vein graft to posterior descending Mitral valve annuloplasty with 32 mm Edwards Physio II annuloplasty ring (model 5200, serial [id number]), Endoscopic vein harvest right leg.  SURGEON:  Charlett Lango, MD  ASSISTANT:  Jari Favre, PA-C   ANESTHESIA:  General.  FINDINGS:  Transesophageal echocardiography showed left ventricular dilatation with severe mitral regurgitation, ejection fraction of approximately 20%, improved LV wall motion post bypass.  No residual mitral regurgitation post bypass.  INTRAOPERATIVE FINDINGS:  Severe cardiomegaly.  Fair quality coronary targets.  Fair quality vein.  Good quality mammary artery.  CLINICAL NOTE:  Michael Banks is a 67 year old gentleman who presented with congestive heart failure.  Workup revealed an ejection fraction of 20% with severe mitral regurgitation.  At catheterization, he had severe 3-vessel coronary disease.  He was  referred for surgery.  The indications, risks, benefits, and alternatives were discussed in detail with the patient.  He understood the high risk nature of the procedure given his severe left ventricular dysfunction.  He  accepted the risks and agreed to  proceed.  DESCRIPTION OF PROCEDURE:  The patient was brought to the preoperative holding area on 05/13/2018.  Anesthesia placed a Swan-Ganz catheter and an arterial blood pressure monitor line.  He was taken to the operating room, anesthetized and intubated.  A  Foley catheter was placed.  The chest, abdomen and legs were prepped and draped in the usual sterile fashion.  Dr. Arta Bruce of the anesthesia service performed transesophageal echocardiography.  The preoperative findings were consistent with his  previous study with severe left ventricular dysfunction and severe mitral regurgitation.  There was no other significant valvular pathology.  Ejection fraction was estimated at approximately 20%.  A time out was performed. An incision was made in the medial aspect of the right leg at the level of the knee.  The greater saphenous vein was identified.  It was harvested endoscopically from midcalf to groin.  The saphenous vein was relatively small caliber and thin walled.  It  was fair quality.  Simultaneously, a median sternotomy was performed, and the left internal mammary artery was harvested using standard technique.  The mammary artery was a good quality vessel with excellent flow when it was divided distally.  Heparin  2000 units was administered during the vessel harvest.  The remainder of the full heparin dose was given prior to opening the pericardium.  After administering the heparin, the pericardium was opened.  There was a moderate pericardial effusion, which was serous.  There also were moderate bilateral pleural effusions which were also serous in nature.  These were all evacuated.  The ascending  aorta was inspected.  There was no evidence of atherosclerotic disease.  After confirming adequate anticoagulation with ACT measurement, the aorta was cannulated via concentric 2 Ethibond pledgeted pursestring sutures. A 36F right angle venous cannula was placed  via a pursestring suture in the superior vena cava.   Cardiopulmonary bypass was initiated.  A pursestring suture was placed in the right atrium just above the diaphragm, and a 36-French malleable cannula was placed via this pursestring suture into the inferior vena cava.  This was connected to the bypass  circuit, and full flow was initiated.  Flows were maintained per protocol.  Anticoagulation was monitored with ACT measurement.  The patient was cooled to 32 degrees Celsius.  The coronary arteries were inspected and anastomotic sites were chosen.  The  conduits were inspected and cut to length.  A retrograde cardioplegia cannula was placed via pursestring suture in the right atrium and directed into the coronary sinus.  An antegrade cardioplegia cannula was placed in the ascending aorta.  Caval tapes  were placed on the superior and inferior vena cava and were only tightened during the open portion of the procedure.  The aorta was crossclamped.  The left ventricle was emptied via the aortic root vent.  Cardiac arrest was achieved with a combination of cold antegrade and retrograde blood cardioplegia and topical iced saline.  An initial 500 mL of cardioplegia was  given antegrade.  There was a rapid diastolic arrest with rather sluggish septal cooling.  With retrograde cardioplegia, there was septal cooling to 9 degrees Celsius.  A reversed saphenous vein graft was placed end-to-side to the posterior descending branch of the right coronary.  This was a 1.5 mm vessel.  It was diffusely diseased.  Only a 1 mm probe passed distally.  The vein was of fair quality.  An end-to-side anastomosis  was performed with a running 7-0 Prolene suture.  At the completion of the anastomosis, cardioplegia was administered down the graft.  There was good flow and good hemostasis.  It should be noted that all anastomoses were probed proximally and distally  at their completion to ensure patency before tying the  sutures.  A reversed saphenous vein graft then was placed sequentially to obtuse marginals 1 and 2.  These were both 1.5 mm vessel at the site of the anastomosis.  They were both fair quality vessels.  The vein was of fair quality.  A side-to-side anastomosis was  performed to OM1 and end-to-side to OM2.  Both were done with running 7-0 Prolene sutures.  Cardioplegia was administered down the graft with good flow and good hemostasis.  Additional cardioplegia was also administered retrograde.  The heart was elevated, exposing the anterior wall.  The LAD was dissected out.  It was felt to be an acceptable target vessel for use of the mammary artery.  An arteriotomy was made in the diagonal branch.  This was a relatively large branch.  It was a  1.5 mm fair quality target.  A reversed saphenous vein was anastomosed end-to-side with a running 7-0 Prolene suture.  There was good flow and good hemostasis with cardioplegia administration.  The left internal mammary artery was brought through a window in the pericardium.  The distal limb was bevelled.  It was anastomosed end-to-side to the distal LAD.  The LAD accepted a 1.5 mm probe which passed to the apex.  There was some diffuse disease  within the vessel.  It was of fair quality.  The mammary was of good quality.  The end-to-side anastomosis was performed with a running 8-0 Prolene suture.  At the completion of the anastomosis, the bulldog clamp was briefly removed to inspect for  hemostasis.  Rapid septal rewarming was noted.  The bulldog clamp was replaced.  The mammary pedicle was tacked to the epicardial surface of the heart with 6-0 Prolene sutures.  Additional retrograde cardioplegia was administered at this point and then every 20 minutes during the remainder of the procedure.  The interatrial groove was dissected out and a left atriotomy was performed.  A mitral retractor was placed.  The mitral  valve was inspected.  There was no prolapsing of the  leaflets.  There was posterior annular dilatation.  2-0 Ethibond horizontal mattress sutures were placed circumferentially around the annulus.  The annulus was sized both from the anterior leaflet and  the mitral commissurotomy sutures for a 32 mm Edwards Physio II annuloplasty ring.  The sutures were placed through the ring.  The ring was lowered into place, and the sutures were sequentially tied.  With testing of the mitral valve, there was  significant leakage in the P3 area.  Decision was made to redo the annuloplasty ring.  The sutures were removed.  The annuloplasty ring was removed.  Again, 2-0 Ethibond horizontal mattress sutures were placed circumferentially around the annulus, then passed through the sewing ring of the annuloplasty ring, which was lowered into place, and the sutures were sequentially tied.  This time with testing of the valve, it was patent except for a small fenestration in the posterior leaflet, which was closed  with a 4-0 Ethibond figure-of-eight suture.  The valve then was noted to be competent.  Rewarming was initiated.  The left atriotomy was closed in 2 layers of running 4-0 Prolene sutures.  The first layer was a horizontal mattress suture followed by  running simple suture.  De-airing was performed at the completion of the first layer.  The vein grafts were cut to length.  The cardioplegia cannula was removed from the ascending aorta.  The proximal vein graft anastomoses were performed to 4.0 mm punch aortotomies with running 6-0 Prolene sutures.  After completion of the final proximal  anastomosis, the patient was placed in Trendelenburg position.  A warm dose of retrograde cardioplegia was administered.  Lidocaine was administered.  The aortic root and left ventricle were de-aired.  The aortic crossclamp was removed.  The total  crossclamp time was 250 minutes.  The patient did not require defibrillation.  He was initially in heart block.  While rewarming was  completed, all proximal and distal anastomoses and the atriotomy were inspected for hemostasis.  Epicardial pacing wires were placed on the right ventricle  and right atrium.  DDD pacing was initiated at 90 beats per minute.  A milrinone loading dose was given, and then an infusion was initiated at 0.375 mcg/kg per minute.  When the patient had rewarmed to a core temperature of 37 degrees Celsius, the  superior vena caval cannula was redirected into the right atrium.  The inferior vena caval cannula was removed.  There was good hemostasis at the site.  The retrograde cardioplegia cannula was removed.  There was good hemostasis there as well.  A trial  weaning was performed.  There was some air within the left ventricle and left atrium.  This was evacuated with an Angiocath via the apex.  After completely deairing the heart, the patient was weaned from cardiopulmonary bypass on the first attempt.  The  total bypass time was 311 minutes.  The initial cardiac index was greater than 2 L/min per sq m.  The patient remained hemodynamically stable throughout the post-bypass period.  A test dose of protamine was administered  and was well tolerated.  The remainder of the protamine was administered without incident.  Transesophageal echocardiography showed no residual mitral regurgitation.  There was improved left ventricular wall  motion in the setting of inotropic support.  The superior vena cava cannula and the aortic cannula were removed.  The remainder of the protamine was administered without incident.  The chest was irrigated with warm saline.  Hemostasis was achieved.  The  pericardium was reapproximated over the ascending aorta with interrupted 3-0 silk sutures.  Left pleural and mediastinal chest tubes were placed through separate subcostal incisions.  The sternum was closed with a combination of single and double  heavy-gauge stainless steel wires.  There was a slight drop in the cardiac index with no  other hemodynamic changes with closure of the sternum.  The pectoralis fascia, subcutaneous tissue and skin were closed in standard fashion.  All sponge, needle and  instrument counts were correct at the end of the procedure.  The patient was taken from the operating room to the surgical intensive care unit, intubated and in stable condition.  LN/NUANCE  D:05/13/2018 T:05/14/2018 JOB:[id number]

## 2018-05-14 NOTE — Progress Notes (Signed)
[facility] BRIEF SICU PROGRESS NOTE  1 Day Post-Op  S/P Procedure(s) (LRB): CORONARY ARTERY BYPASS GRAFTING (CABG) times _five__ using left internal mammary artery and right  greater saphenous vein harvested endoscopically. (N/A) Mitral Valve Repair (N/A) TRANSESOPHAGEAL ECHOCARDIOGRAM (TEE) (N/A)   Stable day AAI paced w/ stable BP, weaning Neo drip Breathing comfortably w/ O2 sats 100% on 2 L/min UOP adequate Labs okay  Plan: Continue current plan  Michael Nailslarence H Galileo Colello, MD 05/14/2018 5:37 PM

## 2018-05-15 ENCOUNTER — Inpatient Hospital Stay (HOSPITAL_COMMUNITY): Payer: Medicare HMO

## 2018-05-15 LAB — BASIC METABOLIC PANEL
ANION GAP: 4 — AB (ref 5–15)
BUN: 9 mg/dL (ref 8–23)
CHLORIDE: 109 mmol/L (ref 98–111)
CO2: 24 mmol/L (ref 22–32)
Calcium: 8.6 mg/dL — ABNORMAL LOW (ref 8.9–10.3)
Creatinine, Ser: 0.78 mg/dL (ref 0.61–1.24)
GFR calc non Af Amer: >60 mL/min (ref >60)
Glucose, Bld: 153 mg/dL — ABNORMAL HIGH (ref 70–99)
POTASSIUM: 4.2 mmol/L (ref 3.5–5.1)
SODIUM: 137 mmol/L (ref 135–145)

## 2018-05-15 LAB — CBC
HEMATOCRIT: 30.1 % — AB (ref 39.0–52.0)
HEMOGLOBIN: 9.6 g/dL — AB (ref 13.0–17.0)
MCH: 30.5 pg (ref 26.0–34.0)
MCHC: 31.9 g/dL (ref 30.0–36.0)
MCV: 95.6 fL (ref 80.0–100.0)
NRBC: 0 % (ref 0.0–0.2)
Platelets: 142 10*3/uL — ABNORMAL LOW (ref 150–400)
RBC: 3.15 MIL/uL — AB (ref 4.22–5.81)
RDW: 14 % (ref 11.5–15.5)
WBC: 10.9 10*3/uL — ABNORMAL HIGH (ref 4.0–10.5)

## 2018-05-15 LAB — GLUCOSE, CAPILLARY
GLUCOSE-CAPILLARY: 164 mg/dL — AB (ref 70–99)
Glucose-Capillary: 109 mg/dL — ABNORMAL HIGH (ref 70–99)
Glucose-Capillary: 136 mg/dL — ABNORMAL HIGH (ref 70–99)
Glucose-Capillary: 137 mg/dL — ABNORMAL HIGH (ref 70–99)
Glucose-Capillary: 143 mg/dL — ABNORMAL HIGH (ref 70–99)
Glucose-Capillary: 145 mg/dL — ABNORMAL HIGH (ref 70–99)
Glucose-Capillary: 163 mg/dL — ABNORMAL HIGH (ref 70–99)

## 2018-05-15 LAB — PROTIME-INR
INR: 1.95
Prothrombin Time: 22 seconds — ABNORMAL HIGH (ref 11.4–15.2)

## 2018-05-15 LAB — COOXEMETRY PANEL
CARBOXYHEMOGLOBIN: 1.5 % (ref 0.5–1.5)
METHEMOGLOBIN: 0.9 % (ref 0.0–1.5)
O2 Saturation: 64.2 %
TOTAL HEMOGLOBIN: 9.9 g/dL — AB (ref 12.0–16.0)

## 2018-05-15 MED ORDER — MOVING RIGHT ALONG BOOK
Freq: Once | Status: AC
  Administered 2018-05-15: 11:00:00
  Filled 2018-05-15: qty 1

## 2018-05-15 MED ORDER — PROMETHAZINE HCL 25 MG/ML IJ SOLN
12.5000 mg | Freq: Four times a day (QID) | INTRAMUSCULAR | Status: DC | PRN
  Administered 2018-05-15: 12.5 mg via INTRAVENOUS
  Filled 2018-05-15: qty 1

## 2018-05-15 MED ORDER — POTASSIUM CHLORIDE CRYS ER 20 MEQ PO TBCR
20.0000 meq | EXTENDED_RELEASE_TABLET | Freq: Two times a day (BID) | ORAL | Status: DC
  Administered 2018-05-15 – 2018-05-16 (×4): 20 meq via ORAL
  Filled 2018-05-15 (×4): qty 1

## 2018-05-15 MED ORDER — FUROSEMIDE 10 MG/ML IJ SOLN
20.0000 mg | Freq: Two times a day (BID) | INTRAMUSCULAR | Status: DC
  Administered 2018-05-15 – 2018-05-16 (×3): 20 mg via INTRAVENOUS
  Filled 2018-05-15 (×3): qty 2

## 2018-05-15 MED ORDER — INSULIN ASPART 100 UNIT/ML ~~LOC~~ SOLN
0.0000 [IU] | Freq: Three times a day (TID) | SUBCUTANEOUS | Status: DC
  Administered 2018-05-15: 2 [IU] via SUBCUTANEOUS
  Administered 2018-05-15: 3 [IU] via SUBCUTANEOUS
  Administered 2018-05-16 – 2018-05-17 (×4): 2 [IU] via SUBCUTANEOUS
  Administered 2018-05-17 – 2018-05-18 (×3): 3 [IU] via SUBCUTANEOUS
  Administered 2018-05-19 (×2): 2 [IU] via SUBCUTANEOUS

## 2018-05-15 NOTE — Progress Notes (Signed)
[address]       [place] [address]             [phone]        CARDIOTHORACIC SURGERY PROGRESS NOTE   R2 Days Post-Op Procedure(s) (LRB): CORONARY ARTERY BYPASS GRAFTING (CABG) times _five__ using left internal mammary artery and right  greater saphenous vein harvested endoscopically. (N/A) Mitral Valve Repair (N/A) TRANSESOPHAGEAL ECHOCARDIOGRAM (TEE) (N/A)  Subjective: Looks good and feels well.  Mild soreness in chest.  No SOB.  Objective: Vital signs: BP Readings from Last 1 Encounters:  05/15/18 (!) 103/58   Pulse Readings from Last 1 Encounters:  05/15/18 90   Resp Readings from Last 1 Encounters:  05/15/18 (!) 26   Temp Readings from Last 1 Encounters:  05/15/18 98.1 F (36.7 C) (Oral)    Hemodynamics: PAP: (39)/(17) 39/17 CO:  [4 L/min] 4 L/min CI:  [2.1 L/min/m2] 2.1 L/min/m2  Mixed venous co-ox 64.2%   Physical Exam:  Rhythm:   sinus  Breath sounds: clear  Heart sounds:  RRR w/out murmur  Incisions:  Dressings dry, intact  Abdomen:  Soft, non-distended, non-tender  Extremities:  Warm, well-perfused  Chest tubes:  decreasing volume thin serosanguinous output, no air leak    Intake/Output from previous day: 11/23 0701 - 11/24 0700 In: 2471.6 [P.O.:720; I.V.:1576.4; IV Piggyback:175.3] Out: 1630 [Urine:930; Emesis/NG output:50; Chest Tube:650] Intake/Output this shift: No intake/output data recorded.  Lab Results:  CBC: Recent Labs    05/14/18 1612 05/14/18 1619 05/15/18 0421  WBC 12.6*  --  10.9*  HGB 9.9* 10.2* 9.6*  HCT 32.8* 30.0* 30.1*  PLT 161  --  142*    BMET:  Recent Labs    05/14/18 0343  05/14/18 1619 05/15/18 0421  NA 135  --  138 137  K 4.4  --  4.4 4.2  CL 109  --  106 109  CO2 19*  --   --  24  GLUCOSE 144*  --  173* 153*  BUN 7*  --  7* 9  CREATININE 0.80   < > 0.60* 0.78  CALCIUM 8.5*  --   --  8.6*   < > = values in this interval not displayed.     PT/INR:   Recent Labs   05/15/18 0421  LABPROT 22.0*  INR 1.95    CBG (last 3)  Recent Labs    05/15/18 0023 05/15/18 0427 05/15/18 0748  GLUCAP 137* 136* 109*    ABG    Component Value Date/Time   PHART 7.365 05/14/2018 0705   PCO2ART 31.6 (L) 05/14/2018 0705   PO2ART 81.0 (L) 05/14/2018 0705   HCO3 17.9 (L) 05/14/2018 0705   TCO2 22 05/14/2018 1619   ACIDBASEDEF 7.0 (H) 05/14/2018 0705   O2SAT 64.2 05/15/2018 0430    CXR: PORTABLE CHEST 1 VIEW  COMPARISON:  05/14/2018 and prior radiographs  FINDINGS: Cardiomegaly, CABG and mitral valve repair changes noted.  RIGHT Swan-Ganz catheter has been removed with venous sheath remaining.  Mediastinal and LEFT thoracostomy tubes are noted without pneumothorax.  RIGHT LOWER lung airspace disease/atelectasis and LEFT LOWER lung consolidation/atelectasis again noted.  No other changes noted.  IMPRESSION: Swan-Ganz catheter removal without other significant change. Continued bilateral LOWER lung opacities/atelectasis. No pneumothorax.   Electronically Signed   By: Harmon Pier M.D.   On: 05/15/2018 07:49   Assessment/Plan: S/P Procedure(s) (LRB): CORONARY ARTERY BYPASS GRAFTING (CABG) times _five__ using left internal mammary artery and right  greater saphenous vein harvested endoscopically. (N/A) Mitral Valve Repair (N/A) TRANSESOPHAGEAL ECHOCARDIOGRAM (TEE) (N/A)  Overall stable POD2 Maintaining NSR w/ stable hemodynamics on very low dose Neo and dopamine for BP support w/ milrinone 0.2 Breathing comfortably w/ O2 sats 95% on 2 L/min, CXR looks okay other than LLL atelectasis +/- effusion Chronic systolic CHF with expected post-op volume excess, weigt 9 kg > preop Expected post op acute blood loss anemia, Hgb 9.1 stable Expected post op atelectasis, L>R   Mobilize  Continue to wean milrinone slowly  Wean pressors as tolerated  Start diuresis  D/C lines  D/C tubes   Coumadin   Purcell Nailslarence H Owen,  MD 05/15/2018 9:32 AM

## 2018-05-15 NOTE — Progress Notes (Signed)
[facility] BRIEF SICU PROGRESS NOTE  2 Days Post-Op  S/P Procedure(s) (LRB): CORONARY ARTERY BYPASS GRAFTING (CABG) times _five__ using left internal mammary artery and right  greater saphenous vein harvested endoscopically. (N/A) Mitral Valve Repair (N/A) TRANSESOPHAGEAL ECHOCARDIOGRAM (TEE) (N/A)   Stable day NSR w/ stable BP off all drips except low dose milrinone Breathng comfortably on room air UOP adequate  Plan: Continue current plan  Purcell Nailslarence H Marchel Foote, MD 05/15/2018 5:40 PM

## 2018-05-15 NOTE — Progress Notes (Signed)
Patient has had intermittent episodes of emesis throughout the day. Volume has been very minimal, less than 5cc per episode and all ave bee preempted by coughing events. The emesis is phlegm. RN has notified Dr. Cornelius Moraswen of the episodes and an order was given for phenergan 12.5mg  IV Q4 PRN if it was needed.  I dose has been given this afternoon and it seems to be working better than the zofran previously given. Patient is currently up in the chair and sipping on some apple juice at this time.

## 2018-05-16 ENCOUNTER — Inpatient Hospital Stay (HOSPITAL_COMMUNITY): Payer: Medicare HMO

## 2018-05-16 ENCOUNTER — Encounter (HOSPITAL_COMMUNITY): Payer: Self-pay | Admitting: Thoracic Surgery (Cardiothoracic Vascular Surgery)

## 2018-05-16 LAB — BASIC METABOLIC PANEL
Anion gap: 6 (ref 5–15)
BUN: 19 mg/dL (ref 8–23)
CO2: 22 mmol/L (ref 22–32)
CREATININE: 1.16 mg/dL (ref 0.61–1.24)
Calcium: 8.7 mg/dL — ABNORMAL LOW (ref 8.9–10.3)
Chloride: 108 mmol/L (ref 98–111)
GFR calc Af Amer: >60 mL/min (ref >60)
GFR calc non Af Amer: >60 mL/min (ref >60)
GLUCOSE: 161 mg/dL — AB (ref 70–99)
Potassium: 4.8 mmol/L (ref 3.5–5.1)
Sodium: 136 mmol/L (ref 135–145)

## 2018-05-16 LAB — CBC
HEMATOCRIT: 31.6 % — AB (ref 39.0–52.0)
Hemoglobin: 9.6 g/dL — ABNORMAL LOW (ref 13.0–17.0)
MCH: 29.5 pg (ref 26.0–34.0)
MCHC: 30.4 g/dL (ref 30.0–36.0)
MCV: 97.2 fL (ref 80.0–100.0)
PLATELETS: 165 10*3/uL (ref 150–400)
RBC: 3.25 MIL/uL — ABNORMAL LOW (ref 4.22–5.81)
RDW: 14.1 % (ref 11.5–15.5)
WBC: 10.6 10*3/uL — ABNORMAL HIGH (ref 4.0–10.5)
nRBC: 0 % (ref 0.0–0.2)

## 2018-05-16 LAB — GLUCOSE, CAPILLARY
Glucose-Capillary: 131 mg/dL — ABNORMAL HIGH (ref 70–99)
Glucose-Capillary: 133 mg/dL — ABNORMAL HIGH (ref 70–99)
Glucose-Capillary: 141 mg/dL — ABNORMAL HIGH (ref 70–99)
Glucose-Capillary: 162 mg/dL — ABNORMAL HIGH (ref 70–99)

## 2018-05-16 LAB — PROTIME-INR
INR: 2.08
PROTHROMBIN TIME: 23.1 s — AB (ref 11.4–15.2)

## 2018-05-16 MED ORDER — SODIUM CHLORIDE 0.9 % IV SOLN: 250.0000 mL | INTRAVENOUS | Status: DC | PRN

## 2018-05-16 MED ORDER — FUROSEMIDE 40 MG PO TABS
40.0000 mg | ORAL_TABLET | Freq: Every day | ORAL | Status: DC
  Administered 2018-05-17 – 2018-05-19 (×3): 40 mg via ORAL
  Filled 2018-05-16 (×3): qty 1

## 2018-05-16 MED ORDER — SODIUM CHLORIDE 0.9% FLUSH: 3.0000 mL | INTRAVENOUS | Status: DC | PRN

## 2018-05-16 MED ORDER — WARFARIN - PHYSICIAN DOSING INPATIENT: Freq: Every day | Status: DC

## 2018-05-16 MED ORDER — CARVEDILOL 3.125 MG PO TABS
3.1250 mg | ORAL_TABLET | Freq: Two times a day (BID) | ORAL | Status: DC
  Administered 2018-05-16 – 2018-05-17 (×4): 3.125 mg via ORAL
  Filled 2018-05-16 (×4): qty 1

## 2018-05-16 MED ORDER — MAGNESIUM HYDROXIDE 400 MG/5ML PO SUSP: 30.0000 mL | Freq: Every day | ORAL | Status: DC | PRN

## 2018-05-16 MED ORDER — ALUM & MAG HYDROXIDE-SIMETH 200-200-20 MG/5ML PO SUSP: 15.0000 mL | Freq: Four times a day (QID) | ORAL | Status: DC | PRN

## 2018-05-16 MED ORDER — SODIUM CHLORIDE 0.9% FLUSH
3.0000 mL | Freq: Two times a day (BID) | INTRAVENOUS | Status: DC
  Administered 2018-05-16 – 2018-05-19 (×5): 3 mL via INTRAVENOUS

## 2018-05-16 MED ORDER — MOVING RIGHT ALONG BOOK
Freq: Once | Status: AC
  Administered 2018-05-16: 18:00:00
  Filled 2018-05-16: qty 1

## 2018-05-16 MED ORDER — WARFARIN SODIUM 1 MG PO TABS
1.0000 mg | ORAL_TABLET | Freq: Every day | ORAL | Status: DC
  Administered 2018-05-16: 1 mg via ORAL
  Filled 2018-05-16: qty 1

## 2018-05-16 NOTE — Discharge Instructions (Addendum)
Coronary Artery Bypass Grafting Coronary artery bypass grafting (CABG) is a procedure to bypass or fix arteries of the heart (coronary arteries) that have become narrow or blocked. This narrowing is usually the result of a buildup of fatty deposits (plaques) in the walls of the vessels. The coronary arteries supply the heart with the oxygen and nutrients that it needs to pump blood to your body. In this procedure, a section of blood vessel from another part of the body (usually the chest, arm, or leg) is removed (harvested) and then inserted where it will allow blood to bypass the damaged part of the coronary artery. The harvested section of blood vessel is called the graft. Tell a health care provider about:  Any allergies you have.  All medicines you are taking or using, including steroids, blood thinners, vitamins, herbs, eye drops, creams, and over-the-counter medicines.  Any problems you or family members have had with anesthetic medicines.  Any blood disorders you have.  Any surgeries you have had.  Any medical conditions you have.  Whether you are pregnant or may be pregnant. What are the risks? Generally, this is a safe procedure. However, problems may occur, including:  Bleeding, which may require transfusions.  Infection.  Short term memory loss, confusion, and personality changes (cognitive dysfunction).  Pain at the surgical site.  Damage to other structures or organs.  Stroke.  Allergic reactions to medicines or dyes.  Heart attack during or after surgery.  Kidney failure.  There may be additional risks and complications depending on where the veins are harvested from in your body for the procedure. What happens before the procedure? Staying hydrated Follow instructions from your health care provider about hydration, which may include:  Up to 2 hours before the procedure - you may continue to drink clear liquids, such as water, clear fruit juice, black  coffee, and plain tea.  Eating and drinking restrictions Follow instructions from your health care provider about eating and drinking, which may include:  8 hours before the procedure - stop eating heavy meals or foods such as meat, fried foods, or fatty foods.  6 hours before the procedure - stop eating light meals or foods, such as toast or cereal.  6 hours before the procedure - stop drinking milk or drinks that contain milk.  2 hours before the procedure - stop drinking clear liquids.  Medicine  Take over-the-counter and prescription medicines only as told by your health care provider.  Ask your health care provider about: ? Changing or stopping your regular medicines. This is especially important if you are taking diabetes medicines or blood thinners. You may be asked to start new medicines and stop taking others. Do not stop medicines or adjust dosages on your own. ? Taking medicines such as aspirin and ibuprofen. These medicines can thin your blood. Do not take these medicines before your procedure if your health care provider instructs you not to.  You may be given antibiotic medicine to help prevent infection. General instructions  Ask your health care provider how your surgical site will be marked or identified.  You may be asked to shower with a germ-killing soap.  For 3-6 weeks before the procedure, try not to use any products that contain nicotine or tobacco, such as cigarettes and e-cigarettes. Quitting smoking is one of the best things you can do for your heart health. If you need help quitting, ask your health care provider.  Talk with your health care provider about where graft  will come from. What happens during the procedure?  To reduce your risk of infection: ? Your health care team will wash or sanitize their hands. ? Your skin will be washed with soap. ? Hair may be removed from the surgical area.  An IV tube will be inserted into one of your veins.  You  will be given one or more of the following: ? A medicine to help you relax (sedative). ? A medicine to make you fall asleep (general anesthetic).  A cut (incision) will be made down the front of the chest through the breastbone (sternum). The sternum will be spread open so your heart can be seen.  You may be placed on a heart-lung bypass machine. This machine will provide oxygen to your blood while the heart is undergoing surgery. Your surgeon may be able to do the surgery without the heart-lung bypass machine. That is called beating heart bypass surgery.  If a heart-lung bypass machine is needed, your heart will be temporarily stopped.  A section of blood vessel will be harvested from another part of your body (usually the chest, arm, or leg) and used to bypass the blocked arteries of your heart.  When the bypass is done, you will be taken off the heart-lung machine if it was used.  If your heart was stopped, it will be restarted and will take over again normally.  Your chest will be closed.  A bandage (dressing) will be placed over the incisions.  Tubes will remain in your chest and will be connected to a suction device to help drain fluid and reinflate the lungs. The procedure may vary among health care providers and hospitals. What happens after the procedure?  Your blood pressure, heart rate, breathing rate, and blood oxygen level will be monitored until the medicines you were given have worn off.  You may wake up with a tube in your throat to help your breathing. You may be connected to a breathing machine. You will not be able to talk while the tube is in place. The tube will be taken out as soon as it is safe.  You will be groggy and may have some pain. You will be given pain medicine to help control the pain.  You will be shown how to do deep breathing exercises. Summary  In this procedure, a section of blood vessel from another part of the body (usually the chest, arm, or  leg) is removed (harvested) and then inserted where it will allow blood to bypass the damaged part of the coronary artery. The harvested section of blood vessel is called the graft.  For 3-6 weeks before the procedure, try not to use any products that contain nicotine or tobacco, such as cigarettes and e-cigarettes. Quitting smoking is one of the best things you can do for your heart health. If you need help quitting, ask your health care provider.  You may be placed on a heart-lung bypass machine during the surgery. This machine will provide oxygen to your blood while the heart is undergoing surgery. Your surgeon may be able to do the surgery without the heart-lung bypass machine. That is called beating heart bypass surgery.  You may wake up with a tube in your throat to help your breathing. You may be connected to a breathing machine. You will not be able to talk while the tube is in place. The tube will be taken out as soon as it is safe. This information is not intended to replace  advice given to you by your health care provider. Make sure you discuss any questions you have with your health care provider. Document Released: 03/18/2005 Document Revised: 04/27/2016 Document Reviewed: 04/27/2016 Elsevier Interactive Patient Education  2018 [employer]   Coronary Artery Bypass Grafting Coronary artery bypass grafting (CABG) is a procedure to bypass or fix arteries of the heart (coronary arteries) that have become narrow or blocked. This narrowing is usually the result of a buildup of fatty deposits (plaques) in the walls of the vessels. The coronary arteries supply the heart with the oxygen and nutrients that it needs to pump blood to your body. In this procedure, a section of blood vessel from another part of the body (usually the chest, arm, or leg) is removed (harvested) and then inserted where it will allow blood to bypass the damaged part of the coronary artery. The harvested section of blood  vessel is called the graft. Tell a health care provider about:  Any allergies you have.  All medicines you are taking or using, including steroids, blood thinners, vitamins, herbs, eye drops, creams, and over-the-counter medicines.  Any problems you or family members have had with anesthetic medicines.  Any blood disorders you have.  Any surgeries you have had.  Any medical conditions you have.  Whether you are pregnant or may be pregnant. What are the risks? Generally, this is a safe procedure. However, problems may occur, including:  Bleeding, which may require transfusions.  Infection.  Short term memory loss, confusion, and personality changes (cognitive dysfunction).  Pain at the surgical site.  Damage to other structures or organs.  Stroke.  Allergic reactions to medicines or dyes.  Heart attack during or after surgery.  Kidney failure.  There may be additional risks and complications depending on where the veins are harvested from in your body for the procedure. What happens before the procedure? Staying hydrated Follow instructions from your health care provider about hydration, which may include:  Up to 2 hours before the procedure - you may continue to drink clear liquids, such as water, clear fruit juice, black coffee, and plain tea.  Eating and drinking restrictions Follow instructions from your health care provider about eating and drinking, which may include:  8 hours before the procedure - stop eating heavy meals or foods such as meat, fried foods, or fatty foods.  6 hours before the procedure - stop eating light meals or foods, such as toast or cereal.  6 hours before the procedure - stop drinking milk or drinks that contain milk.  2 hours before the procedure - stop drinking clear liquids.  Medicine  Take over-the-counter and prescription medicines only as told by your health care provider.  Ask your health care provider about: ? Changing  or stopping your regular medicines. This is especially important if you are taking diabetes medicines or blood thinners. You may be asked to start new medicines and stop taking others. Do not stop medicines or adjust dosages on your own. ? Taking medicines such as aspirin and ibuprofen. These medicines can thin your blood. Do not take these medicines before your procedure if your health care provider instructs you not to.  You may be given antibiotic medicine to help prevent infection. General instructions  Ask your health care provider how your surgical site will be marked or identified.  You may be asked to shower with a germ-killing soap.  For 3-6 weeks before the procedure, try not to use any products that contain nicotine or  tobacco, such as cigarettes and e-cigarettes. Quitting smoking is one of the best things you can do for your heart health. If you need help quitting, ask your health care provider.  Talk with your health care provider about where graft will come from. What happens during the procedure?  To reduce your risk of infection: ? Your health care team will wash or sanitize their hands. ? Your skin will be washed with soap. ? Hair may be removed from the surgical area.  An IV tube will be inserted into one of your veins.  You will be given one or more of the following: ? A medicine to help you relax (sedative). ? A medicine to make you fall asleep (general anesthetic).  A cut (incision) will be made down the front of the chest through the breastbone (sternum). The sternum will be spread open so your heart can be seen.  You may be placed on a heart-lung bypass machine. This machine will provide oxygen to your blood while the heart is undergoing surgery. Your surgeon may be able to do the surgery without the heart-lung bypass machine. That is called beating heart bypass surgery.  If a heart-lung bypass machine is needed, your heart will be temporarily stopped.  A  section of blood vessel will be harvested from another part of your body (usually the chest, arm, or leg) and used to bypass the blocked arteries of your heart.  When the bypass is done, you will be taken off the heart-lung machine if it was used.  If your heart was stopped, it will be restarted and will take over again normally.  Your chest will be closed.  A bandage (dressing) will be placed over the incisions.  Tubes will remain in your chest and will be connected to a suction device to help drain fluid and reinflate the lungs. The procedure may vary among health care providers and hospitals. What happens after the procedure?  Your blood pressure, heart rate, breathing rate, and blood oxygen level will be monitored until the medicines you were given have worn off.  You may wake up with a tube in your throat to help your breathing. You may be connected to a breathing machine. You will not be able to talk while the tube is in place. The tube will be taken out as soon as it is safe.  You will be groggy and may have some pain. You will be given pain medicine to help control the pain.  You will be shown how to do deep breathing exercises. Summary  In this procedure, a section of blood vessel from another part of the body (usually the chest, arm, or leg) is removed (harvested) and then inserted where it will allow blood to bypass the damaged part of the coronary artery. The harvested section of blood vessel is called the graft.  For 3-6 weeks before the procedure, try not to use any products that contain nicotine or tobacco, such as cigarettes and e-cigarettes. Quitting smoking is one of the best things you can do for your heart health. If you need help quitting, ask your health care provider.  You may be placed on a heart-lung bypass machine during the surgery. This machine will provide oxygen to your blood while the heart is undergoing surgery. Your surgeon may be able to do the surgery  without the heart-lung bypass machine. That is called beating heart bypass surgery.  You may wake up with a tube in your throat to help your  breathing. You may be connected to a breathing machine. You will not be able to talk while the tube is in place. The tube will be taken out as soon as it is safe. This information is not intended to replace advice given to you by your health care provider. Make sure you discuss any questions you have with your health care provider. Document Released: 03/18/2005 Document Revised: 04/27/2016 Document Reviewed: 04/27/2016 Elsevier Interactive Patient Education  2018 [employer]       Information on my medicine - Coumadin   (Warfarin)  This medication education was reviewed with me or my healthcare representative as part of my discharge preparation.   Why was Coumadin prescribed for you? Coumadin was prescribed for you because you have a blood clot or a medical condition that can cause an increased risk of forming blood clots. Blood clots can cause serious health problems by blocking the flow of blood to the heart, lung, or brain. Coumadin can prevent harmful blood clots from forming. As a reminder your indication for Coumadin is:   Blood Clot Prevention After Heart Valve Surgery  What test will check on my response to Coumadin? While on Coumadin (warfarin) you will need to have an INR test regularly to ensure that your dose is keeping you in the desired range. The INR (international normalized ratio) number is calculated from the result of the laboratory test called prothrombin time (PT).  If an INR APPOINTMENT HAS NOT ALREADY BEEN MADE FOR YOU please schedule an appointment to have this lab work done by your health care provider within 7 days. Your INR goal is usually a number between:  2 to 3 or your provider may give you a more narrow range like 2-2.5.  Ask your health care provider during an office visit what your goal INR is.  What  do you need  to  know  About  COUMADIN? Take Coumadin (warfarin) exactly as prescribed by your healthcare provider about the same time each day.  DO NOT stop taking without talking to the doctor who prescribed the medication.  Stopping without other blood clot prevention medication to take the place of Coumadin may increase your risk of developing a new clot or stroke.  Get refills before you run out.  What do you do if you miss a dose? If you miss a dose, take it as soon as you remember on the same day then continue your regularly scheduled regimen the next day.  Do not take two doses of Coumadin at the same time.  Important Safety Information A possible side effect of Coumadin (Warfarin) is an increased risk of bleeding. You should call your healthcare provider right away if you experience any of the following: ? Bleeding from an injury or your nose that does not stop. ? Unusual colored urine (red or dark brown) or unusual colored stools (red or black). ? Unusual bruising for unknown reasons. ? A serious fall or if you hit your head (even if there is no bleeding).  Some foods or medicines interact with Coumadin (warfarin) and might alter your response to warfarin. To help avoid this: ? Eat a balanced diet, maintaining a consistent amount of Vitamin K. ? Notify your provider about major diet changes you plan to make. ? Avoid alcohol or limit your intake to 1 drink for women and 2 drinks for men per day. (1 drink is 5 oz. wine, 12 oz. beer, or 1.5 oz. liquor.)  Make sure that ANY health care  provider who prescribes medication for you knows that you are taking Coumadin (warfarin).  Also make sure the healthcare provider who is monitoring your Coumadin knows when you have started a new medication including herbals and non-prescription products.  Coumadin (Warfarin)  Major Drug Interactions  Increased Warfarin Effect Decreased Warfarin Effect  Alcohol (large quantities) Antibiotics (esp. Septra/Bactrim,  Flagyl, Cipro) Amiodarone (Cordarone) Aspirin (ASA) Cimetidine (Tagamet) Megestrol (Megace) NSAIDs (ibuprofen, naproxen, etc.) Piroxicam (Feldene) Propafenone (Rythmol SR) Propranolol (Inderal) Isoniazid (INH) Posaconazole (Noxafil) Barbiturates (Phenobarbital) Carbamazepine (Tegretol) Chlordiazepoxide (Librium) Cholestyramine (Questran) Griseofulvin Oral Contraceptives Rifampin Sucralfate (Carafate) Vitamin K   Coumadin (Warfarin) Major Herbal Interactions  Increased Warfarin Effect Decreased Warfarin Effect  Garlic Ginseng Ginkgo biloba Coenzyme Q10 Green tea St. Johns wort    Coumadin (Warfarin) FOOD Interactions  Eat a consistent number of servings per week of foods HIGH in Vitamin K (1 serving =  cup)  Collards (cooked, or boiled & drained) Kale (cooked, or boiled & drained) Mustard greens (cooked, or boiled & drained) Parsley *serving size only =  cup Spinach (cooked, or boiled & drained) Swiss chard (cooked, or boiled & drained) Turnip greens (cooked, or boiled & drained)  Eat a consistent number of servings per week of foods MEDIUM-HIGH in Vitamin K (1 serving = 1 cup)  Asparagus (cooked, or boiled & drained) Broccoli (cooked, boiled & drained, or raw & chopped) Brussel sprouts (cooked, or boiled & drained) *serving size only =  cup Lettuce, raw (green leaf, endive, romaine) Spinach, raw Turnip greens, raw & chopped   These websites have more information on Coumadin (warfarin):  http://www.king-russell.com/; https://www.hines.net/;

## 2018-05-16 NOTE — Progress Notes (Signed)
3 Days Post-Op Procedure(s) (LRB): CORONARY ARTERY BYPASS GRAFTING (CABG) times _five__ using left internal mammary artery and right  greater saphenous vein harvested endoscopically. (N/A) Mitral Valve Repair (N/A) TRANSESOPHAGEAL ECHOCARDIOGRAM (TEE) (N/A) Subjective: No complaints this AM  Objective: Vital signs in last 24 hours: Temp:  [97.8 F (36.6 C)-98.3 F (36.8 C)] 97.8 F (36.6 C) (11/25 0752) Pulse Rate:  [30-104] 88 (11/25 0752) Cardiac Rhythm: Normal sinus rhythm (11/25 0000) Resp:  [9-29] 22 (11/25 0752) BP: (90-120)/(52-76) 108/63 (11/25 0752) SpO2:  [83 %-98 %] 93 % (11/25 0400) Weight:  [84.4 kg] 84.4 kg (11/25 0400)  Hemodynamic parameters for last 24 hours:    Intake/Output from previous day: 11/24 0701 - 11/25 0700 In: 930.2 [P.O.:340; I.V.:561.5; IV Piggyback:28.7] Out: 556 [Urine:550; Emesis/NG output:6] Intake/Output this shift: No intake/output data recorded.  General appearance: alert, cooperative and no distress Neurologic: intact Heart: regular rate and rhythm Lungs: diminished breath sounds left base Abdomen: normal findings: soft, non-tender  Lab Results: Recent Labs    05/15/18 0421 05/16/18 0316  WBC 10.9* 10.6*  HGB 9.6* 9.6*  HCT 30.1* 31.6*  PLT 142* 165   BMET:  Recent Labs    05/15/18 0421 05/16/18 0316  NA 137 136  K 4.2 4.8  CL 109 108  CO2 24 22  GLUCOSE 153* 161*  BUN 9 19  CREATININE 0.78 1.16  CALCIUM 8.6* 8.7*    PT/INR:  Recent Labs    05/16/18 0316  LABPROT 23.1*  INR 2.08   ABG    Component Value Date/Time   PHART 7.365 05/14/2018 0705   HCO3 17.9 (L) 05/14/2018 0705   TCO2 22 05/14/2018 1619   ACIDBASEDEF 7.0 (H) 05/14/2018 0705   O2SAT 64.2 05/15/2018 0430   CBG (last 3)  Recent Labs    05/15/18 2026 05/16/18 0009 05/16/18 0633  GLUCAP 164* 162* 141*    Assessment/Plan: S/P Procedure(s) (LRB): CORONARY ARTERY BYPASS GRAFTING (CABG) times _five__ using left internal mammary artery and  right  greater saphenous vein harvested endoscopically. (N/A) Mitral Valve Repair (N/A) TRANSESOPHAGEAL ECHOCARDIOGRAM (TEE) (N/A) Plan for transfer to step-down: see transfer orders  POD # 3- looks good CV- dc milrinone  INR already above 2- change warfarin to 1 mg daily  Start Coreg at low dose  Add ACE-I prior to dc if BP allows RESP- left lower lobe atelectasis- continue IS RENAL- creatinine up slightly but WNL- follow  Diurese for volume overload ENDO_ CBG mildly elevated- continue SSI Anemia secondary to ABL- follow Continue cardiac rehab   LOS: 13 days    Loreli SlotSteven C  05/16/2018

## 2018-05-16 NOTE — Progress Notes (Signed)
Progress Note  Patient Name: Michael Banks Date of Encounter: 05/16/2018  Primary Cardiologist: Tobias AlexanderKatarina Nelson, MD    Subjective    67 yo with recent CABG and MV repair  hemodynamics look good  Had some emisis yesterday  Feeling better  Is back on liquid diet - thinks he could eat regular food    Inpatient Medications    Scheduled Meds: . acetaminophen  1,000 mg Oral Q6H  . atorvastatin  80 mg Oral q1800  . bisacodyl  10 mg Oral Daily   Or  . bisacodyl  10 mg Rectal Daily  . Chlorhexidine Gluconate Cloth  6 each Topical Daily  . docusate sodium  200 mg Oral Daily  . enoxaparin (LOVENOX) injection  40 mg Subcutaneous QHS  . furosemide  20 mg Intravenous BID  . insulin aspart  0-15 Units Subcutaneous TID WC  . pantoprazole  40 mg Oral Daily  . potassium chloride  20 mEq Oral BID  . sodium chloride flush  10-40 mL Intracatheter Q12H  . sodium chloride flush  3 mL Intravenous Q12H  . warfarin  2.5 mg Oral q1800  . Warfarin - Physician Dosing Inpatient   Does not apply q1800   Continuous Infusions: . sodium chloride    . DOPamine Stopped (05/15/18 1330)  . lactated ringers    . lactated ringers 20 mL/hr at 05/15/18 0600  . milrinone 0.1 mcg/kg/min (05/15/18 0945)  . phenylephrine (NEO-SYNEPHRINE) Adult infusion Stopped (05/15/18 1000)   PRN Meds: metoprolol tartrate, morphine injection, ondansetron (ZOFRAN) IV, oxyCODONE, promethazine, sodium chloride flush, sodium chloride flush, traMADol   Vital Signs    Vitals:   05/15/18 2300 05/16/18 0000 05/16/18 0400 05/16/18 0752  BP: 112/65 109/66 105/65 108/63  Pulse: 82 80 81 88  Resp: 17 10 11  (!) 22  Temp:  98 F (36.7 C)  97.8 F (36.6 C)  TempSrc:  Oral  Oral  SpO2: 96% 98% 93%   Weight:   84.4 kg   Height:        Intake/Output Summary (Last 24 hours) at 05/16/2018 0815 Last data filed at 05/16/2018 0426 Gross per 24 hour  Intake 865.22 ml  Output 556 ml  Net 309.22 ml   Filed Weights   05/14/18  0500 05/15/18 0600 05/16/18 0400  Weight: 83.7 kg 84.5 kg 84.4 kg    Telemetry    NSR  - Personally Reviewed  ECG     NSR  - Personally Reviewed  Physical Exam   GEN:  elderly man, NAD  Neck: No JVD Cardiac:   RR , .  Respiratory:   Decreased breath sound in lower half of left lung. GI: Soft, nontender, non-distended  MS: No edema; No deformity. Neuro:  Nonfocal  Psych: Normal affect   Labs    Chemistry Recent Labs  Lab 05/12/18 1629  05/14/18 0343 05/14/18 1612 05/14/18 1619 05/15/18 0421 05/16/18 0316  NA 136   < > 135  --  138 137 136  K 4.1   < > 4.4  --  4.4 4.2 4.8  CL 102   < > 109  --  106 109 108  CO2 25   < > 19*  --   --  24 22  GLUCOSE 121*   < > 144*  --  173* 153* 161*  BUN 7*   < > 7*  --  7* 9 19  CREATININE 1.00   < > 0.80 0.85 0.60* 0.78 1.16  CALCIUM 8.9   < >  8.5*  --   --  8.6* 8.7*  PROT 6.6  --   --   --   --   --   --   ALBUMIN 3.2*  --   --   --   --   --   --   AST 24  --   --   --   --   --   --   ALT 30  --   --   --   --   --   --   ALKPHOS 50  --   --   --   --   --   --   BILITOT 1.5*  --   --   --   --   --   --   GFRNONAA >60   < > >60 >60  --  >60 >60  GFRAA >60   < > >60 >60  --  >60 >60  ANIONGAP 9   < > 7  --   --  4* 6   < > = values in this interval not displayed.     Hematology Recent Labs  Lab 05/14/18 1612 05/14/18 1619 05/15/18 0421 05/16/18 0316  WBC 12.6*  --  10.9* 10.6*  RBC 3.38*  --  3.15* 3.25*  HGB 9.9* 10.2* 9.6* 9.6*  HCT 32.8* 30.0* 30.1* 31.6*  MCV 97.0  --  95.6 97.2  MCH 29.3  --  30.5 29.5  MCHC 30.2  --  31.9 30.4  RDW 14.1  --  14.0 14.1  PLT 161  --  142* 165    Cardiac EnzymesNo results for input(s): TROPONINI in the last 168 hours. No results for input(s): TROPIPOC in the last 168 hours.   BNPNo results for input(s): BNP, PROBNP in the last 168 hours.   DDimer No results for input(s): DDIMER in the last 168 hours.   Radiology    Dg Chest Port 1 View  Result Date:  05/15/2018 CLINICAL DATA:  S/p CABG and mitral valve repair. EXAM: PORTABLE CHEST 1 VIEW COMPARISON:  05/14/2018 and prior radiographs FINDINGS: Cardiomegaly, CABG and mitral valve repair changes noted. RIGHT Swan-Ganz catheter has been removed with venous sheath remaining. Mediastinal and LEFT thoracostomy tubes are noted without pneumothorax. RIGHT LOWER lung airspace disease/atelectasis and LEFT LOWER lung consolidation/atelectasis again noted. No other changes noted. IMPRESSION: Swan-Ganz catheter removal without other significant change. Continued bilateral LOWER lung opacities/atelectasis. No pneumothorax. Electronically Signed   By: Harmon Pier M.D.   On: 05/15/2018 07:49    Cardiac Studies      Patient Profile     67 y.o. male with CAD and MR . Now s/p CABG and MV repair   Assessment & Plan    1.  CAD / CABG:   No angina .   Continue meds   .   He has decreased breath sound in left base.   Will need to keep an eye on this  Agree with advancing diet   2.   MVR:  Doing well        For questions or updates, please contact [facility] Please consult [email or web address] for contact info under        Signed, Kristeen Miss, MD  05/16/2018, 8:15 AM

## 2018-05-16 NOTE — Plan of Care (Signed)
  Problem: Education: Goal: Knowledge of General Education information will improve Description Including pain rating scale, medication(s)/side effects and non-pharmacologic comfort measures Outcome: Progressing   Problem: Health Behavior/Discharge Planning: Goal: Ability to manage health-related needs will improve Outcome: Progressing   

## 2018-05-17 ENCOUNTER — Inpatient Hospital Stay (HOSPITAL_COMMUNITY): Payer: Medicare HMO

## 2018-05-17 LAB — GLUCOSE, CAPILLARY
GLUCOSE-CAPILLARY: 135 mg/dL — AB (ref 70–99)
GLUCOSE-CAPILLARY: 106 mg/dL — AB (ref 70–99)
GLUCOSE-CAPILLARY: 169 mg/dL — AB (ref 70–99)
Glucose-Capillary: 155 mg/dL — ABNORMAL HIGH (ref 70–99)

## 2018-05-17 LAB — BASIC METABOLIC PANEL
ANION GAP: 7 (ref 5–15)
BUN: 25 mg/dL — AB (ref 8–23)
CHLORIDE: 108 mmol/L (ref 98–111)
CO2: 22 mmol/L (ref 22–32)
Calcium: 8.8 mg/dL — ABNORMAL LOW (ref 8.9–10.3)
Creatinine, Ser: 1.25 mg/dL — ABNORMAL HIGH (ref 0.61–1.24)
GFR calc Af Amer: >60 mL/min (ref >60)
GFR, EST NON AFRICAN AMERICAN: 59 mL/min — AB (ref >60)
GLUCOSE: 148 mg/dL — AB (ref 70–99)
POTASSIUM: 5.5 mmol/L — AB (ref 3.5–5.1)
Sodium: 137 mmol/L (ref 135–145)

## 2018-05-17 LAB — CBC
HCT: 32.6 % — ABNORMAL LOW (ref 39.0–52.0)
HEMOGLOBIN: 9.9 g/dL — AB (ref 13.0–17.0)
MCH: 29.1 pg (ref 26.0–34.0)
MCHC: 30.4 g/dL (ref 30.0–36.0)
MCV: 95.9 fL (ref 80.0–100.0)
NRBC: 0 % (ref 0.0–0.2)
Platelets: 217 10*3/uL (ref 150–400)
RBC: 3.4 MIL/uL — AB (ref 4.22–5.81)
RDW: 14.2 % (ref 11.5–15.5)
WBC: 10 10*3/uL (ref 4.0–10.5)

## 2018-05-17 LAB — PROTIME-INR
INR: 2.45
Prothrombin Time: 26.3 seconds — ABNORMAL HIGH (ref 11.4–15.2)

## 2018-05-17 MED ORDER — FLEET ENEMA 7-19 GM/118ML RE ENEM: 1.0000 | ENEMA | Freq: Once | RECTAL | Status: DC

## 2018-05-17 MED ORDER — ASPIRIN EC 325 MG PO TBEC
325.0000 mg | DELAYED_RELEASE_TABLET | Freq: Every day | ORAL | Status: DC
  Administered 2018-05-17: 325 mg via ORAL
  Filled 2018-05-17: qty 1

## 2018-05-17 MED ORDER — SENNA 8.6 MG PO TABS
1.0000 | ORAL_TABLET | Freq: Two times a day (BID) | ORAL | Status: DC | PRN
  Administered 2018-05-17: 8.6 mg via ORAL
  Filled 2018-05-17: qty 1

## 2018-05-17 MED ORDER — AMIODARONE HCL IN DEXTROSE 360-4.14 MG/200ML-% IV SOLN
INTRAVENOUS | Status: AC
  Filled 2018-05-17: qty 200

## 2018-05-17 MED ORDER — AMIODARONE HCL IN DEXTROSE 360-4.14 MG/200ML-% IV SOLN: 30.0000 mg/h | INTRAVENOUS | Status: DC

## 2018-05-17 MED ORDER — AMIODARONE HCL IN DEXTROSE 360-4.14 MG/200ML-% IV SOLN: 60.0000 mg/h | INTRAVENOUS | Status: DC

## 2018-05-17 MED ORDER — AMIODARONE LOAD VIA INFUSION
150.0000 mg | Freq: Once | INTRAVENOUS | Status: AC
  Administered 2018-05-17: 150 mg via INTRAVENOUS

## 2018-05-17 MED ORDER — LACTULOSE 10 GM/15ML PO SOLN
20.0000 g | Freq: Once | ORAL | Status: AC
  Administered 2018-05-17: 20 g via ORAL
  Filled 2018-05-17: qty 30

## 2018-05-17 NOTE — Progress Notes (Addendum)
Progress Note  Patient Name: Michael Banks Date of Encounter: 05/17/2018  Primary Cardiologist: Tobias Alexander, MD  Subjective   Gets SOB with exertion but once he settles into bed he's ok. No CP. Fatigues easily. No orthopnea reported. Surgical team addressing constipation.  Inpatient Medications    Scheduled Meds: . acetaminophen  1,000 mg Oral Q6H  . atorvastatin  80 mg Oral q1800  . bisacodyl  10 mg Oral Daily   Or  . bisacodyl  10 mg Rectal Daily  . carvedilol  3.125 mg Oral BID WC  . docusate sodium  200 mg Oral Daily  . furosemide  40 mg Oral Daily  . insulin aspart  0-15 Units Subcutaneous TID WC  . pantoprazole  40 mg Oral Daily  . sodium chloride flush  3 mL Intravenous Q12H  . Warfarin - Physician Dosing Inpatient   Does not apply q1800   Continuous Infusions: . sodium chloride     PRN Meds: sodium chloride, alum & mag hydroxide-simeth, magnesium hydroxide, ondansetron (ZOFRAN) IV, oxyCODONE, promethazine, senna, sodium chloride flush, traMADol   Vital Signs    Vitals:   05/17/18 0435 05/17/18 0500 05/17/18 0832 05/17/18 1158  BP:  116/75 123/81 117/63  Pulse:  73 79 68  Resp:  (!) 27  (!) 25  Temp:  97.7 F (36.5 C)  98 F (36.7 C)  TempSrc:  Oral  Oral  SpO2:  97%  98%  Weight: 84.4 kg     Height:        Intake/Output Summary (Last 24 hours) at 05/17/2018 1350 Last data filed at 05/16/2018 1911 Gross per 24 hour  Intake 250 ml  Output 200 ml  Net 50 ml   Filed Weights   05/15/18 0600 05/16/18 0400 05/17/18 0435  Weight: 84.5 kg 84.4 kg 84.4 kg    Telemetry    NSR - Personally Reviewed  Physical Exam   GEN: No acute distress.  HEENT: Normocephalic, atraumatic, sclera non-icteric. Neck: No JVD or bruits. Cardiac: RRR no murmurs, rubs, or gallops.  Radials/DP/PT 1+ and equal bilaterally.  Respiratory: Decreased BS bilaterally. Breathing is unlabored. Air movement is overall decreased GI: Soft, nontender, non-distended, BS +x  4. MS: no deformity. Extremities: No clubbing or cyanosis. No edema. Distal pedal pulses are 2+ and equal bilaterally. Neuro:  AAOx3. Follows commands. Psych:  Responds to questions appropriately with a normal affect.  Labs    Chemistry Recent Labs  Lab 05/12/18 1629  05/15/18 0421 05/16/18 0316 05/17/18 0333  NA 136   < > 137 136 137  K 4.1   < > 4.2 4.8 5.5*  CL 102   < > 109 108 108  CO2 25   < > 24 22 22   GLUCOSE 121*   < > 153* 161* 148*  BUN 7*   < > 9 19 25*  CREATININE 1.00   < > 0.78 1.16 1.25*  CALCIUM 8.9   < > 8.6* 8.7* 8.8*  PROT 6.6  --   --   --   --   ALBUMIN 3.2*  --   --   --   --   AST 24  --   --   --   --   ALT 30  --   --   --   --   ALKPHOS 50  --   --   --   --   BILITOT 1.5*  --   --   --   --  GFRNONAA >60   < > >60 >60 59*  GFRAA >60   < > >60 >60 >60  ANIONGAP 9   < > 4* 6 7   < > = values in this interval not displayed.     Hematology Recent Labs  Lab 05/15/18 0421 05/16/18 0316 05/17/18 0333  WBC 10.9* 10.6* 10.0  RBC 3.15* 3.25* 3.40*  HGB 9.6* 9.6* 9.9*  HCT 30.1* 31.6* 32.6*  MCV 95.6 97.2 95.9  MCH 30.5 29.5 29.1  MCHC 31.9 30.4 30.4  RDW 14.0 14.1 14.2  PLT 142* 165 217    Cardiac EnzymesNo results for input(s): TROPONINI in the last 168 hours. No results for input(s): TROPIPOC in the last 168 hours.   BNPNo results for input(s): BNP, PROBNP in the last 168 hours.   DDimer No results for input(s): DDIMER in the last 168 hours.   Radiology    Dg Chest 2 View  Result Date: 05/17/2018 CLINICAL DATA:  Shortness of breath on exertion EXAM: CHEST - 2 VIEW COMPARISON:  05/16/2018 FINDINGS: Cardiac shadow is enlarged but stable. Postsurgical changes are again seen. Bilateral pleural effusions left greater than right are again noted and stable given some variation in the technical factors. Bibasilar atelectasis is present. No new focal infiltrate is seen. IMPRESSION: No significant interval change from the previous day.  Electronically Signed   By: Alcide Clever M.D.   On: 05/17/2018 08:42   Dg Chest Port 1 View  Result Date: 05/16/2018 CLINICAL DATA:  Chest pain following coronary bypass grafting EXAM: PORTABLE CHEST 1 VIEW COMPARISON:  05/15/2018 FINDINGS: Cardiac shadow remains enlarged. Postsurgical changes are again seen. Mediastinal drain and left thoracostomy catheter have been removed in the interval. Small left pleural effusion and underlying atelectatic changes are again seen. No pneumothorax is noted. No bony abnormality is seen. IMPRESSION: Stable left basilar atelectasis and effusion. No pneumothorax is noted following chest tube removal. Electronically Signed   By: Alcide Clever M.D.   On: 05/16/2018 08:52    Patient Profile     67 y.o. male with hypertension, hyperlipidemia, tobacco abuse was admitted 05/03/18 with SOB, orthopnea, PND, abdominal bloating. Troponin 0.09. Echocardiogram showed severely reduced LV function EF 20-25% and moderate mitral regurgitation. Cardiac catheterization revealed severe coronary disease and severe mitral regurgitation. Myocardial viability study revealed viability in 12 out of 17 segments. Patient underwent CABGx5 and mitral valve repair with annuloplasty ring on 05/14/18.  Assessment & Plan    1. CAD s/p CABG - patient progressing. Patient's aspirin was discontinued after 05/14/18 by cardiac surgery team. I will clarify with rounding MD if this should be resumed in lower dose or if the plan is for Coumadin monotherapy given MV repair. I put in a call to CVTS coordinator to help clarify as well. Addendum: CVTS team confirms patient should be continued on ASA 325mg , will resume.  2. Ischemic cardiomyopathy with acute combined CHF this admission - today's CXR showed bilateral pleural effusions L>R. Weight remains about 20lb above pre-surgical weight. Rec'd 20mg  IV Lasix 11/24-25, but now on Lasix 40mg  daily. If diuresis does not pick up consider transition back to Iv form.  Creatinine has bumped as well. Continue BB. Would not initiate ACEI/ARB/spiro yet given rising Cr and tendency for low-normal BP.  3. Moderate-severe MR - s/p MV repair. Will need lifelong SBE. Now on Coumadin.  4. HLD - continue high dose statin. If the patient is tolerating statin at time of follow-up appointment, would consider rechecking liver function/lipid  panel in 6-8 weeks.  For questions or updates, please contact [facility] Please consult [email or web address] for contact info under Cardiology/STEMI.  Signed, Laurann Montanaayna N Dunn, PA-C 05/17/2018, 1:50 PM     Attending Note:   The patient was seen and examined.  Agree with assessment and plan as noted above.  Changes made to the above note as needed.  Patient seen and independently examined with .   We discussed all aspects of the encounter. I agree with the assessment and plan as stated above.  1.   CAD / MVR Continues to improve slowly Is still very volume overloaded He has markedly reduced breath sounds but only small pleural effusions. Will encourage IS.   2.  MV repair:   Continue coumadin      I have spent a total of 40 minutes with patient reviewing hospital  notes , telemetry, EKGs, labs and examining patient as well as establishing an assessment and plan that was discussed with the patient. > 50% of time was spent in direct patient care.    Vesta MixerPhilip J. Kamsiyochukwu Buist, Montez HagemanJr., MD, [facility] BrooklynFACC 05/17/2018, 3:32 PM [address],  [address] Office [phone] Pager [phone]

## 2018-05-17 NOTE — Progress Notes (Addendum)
      [address]       [place] [id number]             [phone]      4 Days Post-Op Procedure(s) (LRB): CORONARY ARTERY BYPASS GRAFTING (CABG) times _five__ using left internal mammary artery and right  greater saphenous vein harvested endoscopically. (N/A) Mitral Valve Repair (N/A) TRANSESOPHAGEAL ECHOCARDIOGRAM (TEE) (N/A) Subjective: No issues overnight. He is constipated  Objective: Vital signs in last 24 hours: Temp:  [97.5 F (36.4 C)-98.3 F (36.8 C)] 97.7 F (36.5 C) (11/26 0500) Pulse Rate:  [72-88] 73 (11/26 0500) Cardiac Rhythm: Normal sinus rhythm (11/25 2000) Resp:  [13-27] 27 (11/26 0500) BP: (95-116)/(63-75) 116/75 (11/26 0500) SpO2:  [93 %-98 %] 97 % (11/26 0500) Weight:  [84.4 kg] 84.4 kg (11/26 0435)     Intake/Output from previous day: 11/25 0701 - 11/26 0700 In: 375 [P.O.:375] Out: 350 [Urine:350] Intake/Output this shift: No intake/output data recorded.  General appearance: alert, cooperative and no distress Heart: regular rate and rhythm, S1, S2 normal, no murmur, click, rub or gallop Lungs: clear to auscultation bilaterally Abdomen: soft, non-tender; bowel sounds normal; no masses,  no organomegaly Extremities: extremities normal, atraumatic, no cyanosis or edema Wound: clean and dry  Lab Results: Recent Labs    05/16/18 0316 05/17/18 0333  WBC 10.6* 10.0  HGB 9.6* 9.9*  HCT 31.6* 32.6*  PLT 165 217   BMET:  Recent Labs    05/16/18 0316 05/17/18 0333  NA 136 137  K 4.8 5.5*  CL 108 108  CO2 22 22  GLUCOSE 161* 148*  BUN 19 25*  CREATININE 1.16 1.25*  CALCIUM 8.7* 8.8*    PT/INR:  Recent Labs    05/17/18 0333  LABPROT 26.3*  INR 2.45   ABG    Component Value Date/Time   PHART 7.365 05/14/2018 0705   HCO3 17.9 (L) 05/14/2018 0705   TCO2 22 05/14/2018 1619   ACIDBASEDEF 7.0 (H) 05/14/2018 0705   O2SAT 64.2 05/15/2018 0430   CBG (last 3)  Recent Labs    05/16/18 1132 05/16/18 1635  05/17/18 0650  GLUCAP 131* 133* 135*    Assessment/Plan: S/P Procedure(s) (LRB): CORONARY ARTERY BYPASS GRAFTING (CABG) times _five__ using left internal mammary artery and right  greater saphenous vein harvested endoscopically. (N/A) Mitral Valve Repair (N/A) TRANSESOPHAGEAL ECHOCARDIOGRAM (TEE) (N/A)  1. CV-NSR in the 70s, BP well controlled. He is on Coreg, Lipitor, and coumadin. Only on 1mg  of coumadin-INR is 2.45. 2. Pulm-tolerating 2L [place] 2. Continue incentive spirometer 3. Renal-creatinine 1.25, electrolytes okay. Continue Lasix-holding potassium supplementation since potassium is 5.5 4. H and H is stable, 9.9/32.6 5. Endo-blood glucose well controlled  Plan: Will check and see if its okay to pull wires with INR > 2. May need to let it drift down before pulling. Ordered senokot for constipation. Work on ambulation and weaning of supplemental oxygen.    LOS: 14 days    Sharlene Doryessa N Conte 05/17/2018 Patient seen and examined, agree with above Will hold coumadin today Dc EPW tomorrow if INR down  Viviann SpareSteven C. Dorris FetchHendrickson, MD [facility] [phone]

## 2018-05-17 NOTE — Progress Notes (Signed)
Pt started on amiodarone  bolus. Pt converted to sinus brady @ 50. amiodarone drip stopped. Dr. Dorris FetchHendrickson called and advises if pt goes into Afib to restart amiodarone with no bolus. Lacy DuverneyJennifer Pecolia Marando, RN

## 2018-05-17 NOTE — Progress Notes (Addendum)
CARDIAC REHAB PHASE I   Upon entering room, pt exiting the bathroom. Offered to walk with pt, pt declining at this time stating he is too tired from using the BR. Helped pt return to bed. Pt states he has not walked in hallway today. Encouraged 3 walks today and IS use. Will follow-up as able. RN aware of pts need to walk.  [phone] Reynold Boweneresa  Charlottie Peragine, RN BSN 05/17/2018 11:16 AM

## 2018-05-17 NOTE — Progress Notes (Signed)
Pt in AFib @ 114. Dr. Dorris FetchHendrickson advises amio per protocol. Lacy DuverneyJennifer Alayiah Fontes

## 2018-05-18 LAB — GLUCOSE, CAPILLARY
GLUCOSE-CAPILLARY: 161 mg/dL — AB (ref 70–99)
GLUCOSE-CAPILLARY: 175 mg/dL — AB (ref 70–99)
Glucose-Capillary: 120 mg/dL — ABNORMAL HIGH (ref 70–99)
Glucose-Capillary: 139 mg/dL — ABNORMAL HIGH (ref 70–99)

## 2018-05-18 LAB — BASIC METABOLIC PANEL
Anion gap: 8 (ref 5–15)
BUN: 23 mg/dL (ref 8–23)
CHLORIDE: 106 mmol/L (ref 98–111)
CO2: 22 mmol/L (ref 22–32)
CREATININE: 1.02 mg/dL (ref 0.61–1.24)
Calcium: 8.8 mg/dL — ABNORMAL LOW (ref 8.9–10.3)
GFR calc Af Amer: >60 mL/min (ref >60)
GFR calc non Af Amer: >60 mL/min (ref >60)
Glucose, Bld: 175 mg/dL — ABNORMAL HIGH (ref 70–99)
Potassium: 5.1 mmol/L (ref 3.5–5.1)
Sodium: 136 mmol/L (ref 135–145)

## 2018-05-18 LAB — PROTIME-INR
INR: 2.01
Prothrombin Time: 22.5 seconds — ABNORMAL HIGH (ref 11.4–15.2)

## 2018-05-18 MED ORDER — LISINOPRIL 5 MG PO TABS
5.0000 mg | ORAL_TABLET | Freq: Every day | ORAL | Status: DC
  Administered 2018-05-18 – 2018-05-19 (×2): 5 mg via ORAL
  Filled 2018-05-18 (×2): qty 1

## 2018-05-18 MED ORDER — WARFARIN SODIUM 1 MG PO TABS: 1.0000 mg | ORAL_TABLET | Freq: Every day | ORAL | Status: DC

## 2018-05-18 MED ORDER — LACTULOSE 10 GM/15ML PO SOLN: 10.0000 g | Freq: Every day | ORAL | Status: DC | PRN

## 2018-05-18 MED ORDER — ASPIRIN EC 81 MG PO TBEC
81.0000 mg | DELAYED_RELEASE_TABLET | Freq: Every day | ORAL | Status: DC
  Administered 2018-05-18 – 2018-05-19 (×2): 81 mg via ORAL
  Filled 2018-05-18 (×2): qty 1

## 2018-05-18 MED ORDER — CARVEDILOL 6.25 MG PO TABS
6.2500 mg | ORAL_TABLET | Freq: Two times a day (BID) | ORAL | Status: DC
  Administered 2018-05-18 – 2018-05-19 (×3): 6.25 mg via ORAL
  Filled 2018-05-18 (×3): qty 1

## 2018-05-18 NOTE — Social Work (Signed)
[facility] Pt ambulating 41900ft. Lik[facility] not received [facility], however will need PT/OT evals if he does need SNF level care.  CSW acknowledging consult for SNF placement. Will follow for therapy recommendations.   Michael GravesIsabel Elden Brucato, MSW, [facility] CenterCSWA [place] Clinical Social Work [phone]

## 2018-05-18 NOTE — Progress Notes (Addendum)
[address]       [place] [id number]             [phone]      5 Days Post-Op Procedure(s) (LRB): CORONARY ARTERY BYPASS GRAFTING (CABG) times _five__ using left internal mammary artery and right  greater saphenous vein harvested endoscopically. (N/A) Mitral Valve Repair (N/A) TRANSESOPHAGEAL ECHOCARDIOGRAM (TEE) (N/A)    Subjective: He states he was up most of the night having multiple bowel movements.  Objective: Vital signs in last 24 hours: Temp:  [97.3 F (36.3 C)-98 F (36.7 C)] 97.4 F (36.3 C) (11/27 0255) Pulse Rate:  [54-99] 68 (11/27 0255) Cardiac Rhythm: Normal sinus rhythm (11/26 1900) Resp:  [20-29] 29 (11/27 0255) BP: (106-123)/(63-81) 122/81 (11/27 0255) SpO2:  [96 %-99 %] 98 % (11/27 0255) Weight:  [83.9 kg] 83.9 kg (11/27 0247)     Intake/Output from previous day: 11/26 0701 - 11/27 0700 In: 240 [P.O.:240] Out: -  Intake/Output this shift: No intake/output data recorded.  General appearance: alert, cooperative and no distress Heart: regular rate and rhythm, S1, S2 normal, no murmur, click, rub or gallop Lungs: clear to auscultation bilaterally Abdomen: soft, non-tender; bowel sounds normal; no masses,  no organomegaly Extremities: extremities normal, atraumatic, no cyanosis or edema Wound: clean and dry  Lab Results: Recent Labs    05/16/18 0316 05/17/18 0333  WBC 10.6* 10.0  HGB 9.6* 9.9*  HCT 31.6* 32.6*  PLT 165 217   BMET:  Recent Labs    05/17/18 0333 05/18/18 0323  NA 137 136  K 5.5* 5.1  CL 108 106  CO2 22 22  GLUCOSE 148* 175*  BUN 25* 23  CREATININE 1.25* 1.02  CALCIUM 8.8* 8.8*    PT/INR:  Recent Labs    05/18/18 0323  LABPROT 22.5*  INR 2.01   ABG    Component Value Date/Time   PHART 7.365 05/14/2018 0705   HCO3 17.9 (L) 05/14/2018 0705   TCO2 22 05/14/2018 1619   ACIDBASEDEF 7.0 (H) 05/14/2018 0705   O2SAT 64.2 05/15/2018 0430   CBG (last 3)  Recent Labs    05/17/18 1641  05/17/18 2010 05/18/18 0629  GLUCAP 106* 169* 120*    Assessment/Plan: S/P Procedure(s) (LRB): CORONARY ARTERY BYPASS GRAFTING (CABG) times _five__ using left internal mammary artery and right  greater saphenous vein harvested endoscopically. (N/A) Mitral Valve Repair (N/A) TRANSESOPHAGEAL ECHOCARDIOGRAM (TEE) (N/A)  1. CV-NSR in the 70s, BP well controlled. He is on Coreg, Lipitor, ASA and coumadin. Only on 1mg  of coumadin-INR is 2.01.Coumadin held yesterday for pulling wires today however, had an episode of atrial fibrillation last night. Converted to SB with Amio therapy. It has since been stopped.  2. Pulm-tolerating 2L [place] with excellent oxygenation. Continue incentive spirometer. CXR stable. Wean oxygen as able.  3. Renal-creatinine 1.02, electrolytes okay. Continue Lasix-holding potassium supplementation since potassium is 5.1 4. H and H is stable, 9.9/32.6, expected acute blood loss anemia 5. Endo-blood glucose well controlled  Plan: Did not walk in the halls at all yesterday. Plans to walk today. Multiple bowel movements overnight. Will likely need to wait until the morning to pull wires once rhythm has been stable x 24 hours since he did have a brief episode of atrial fibrillation. Continue to wean oxygen as tolerated. Home soon.    LOS: 15 days    Michael Banks 05/18/2018 Patient seen and examined Will dc EPW today Will increase coreg to 6.25 BID Wean  O2  Salvatore Decent. Dorris Fetch, MD [facility] [phone]

## 2018-05-18 NOTE — Discharge Summary (Addendum)
[address]       [place] [id number]             [phone]      Physician Discharge Summary  Patient ID: Michael Banks MRN: [id number] DOB/AGE: 09-09-1950 67 y.o.  Admit date: 05/03/2018 Discharge date: 05/19/2018  Admission Diagnoses: Class IV acute systolic left heart failure Patient Active Problem List   Diagnosis Date Noted  . Coronary artery disease involving native heart with angina pectoris (HCC)   . New onset of congestive heart failure (HCC)   . Dilated cardiomyopathy (HCC)   . Mitral valve insufficiency   . Demand ischemia (HCC)   . Pulmonary edema 05/03/2018    Discharge Diagnoses: Class IV acute systolic left heart failure Active Problems:   Pulmonary edema   Demand ischemia (HCC)   New onset of congestive heart failure (HCC)   Dilated cardiomyopathy (HCC)   Mitral valve insufficiency   Coronary artery disease involving native heart with angina pectoris (HCC)   S/P CABG x 5   Discharged Condition: good  HPI:   Michael Banks is a 67 year old male with past medical history significant for hypertension, hyperlipidemia, Type 2 Diabetes Mellitus, tobacco abuse, dilated cardiomyopathy, and mitral valve insufficiency who presented with 1 week of increasing shortness of breath but he did not have any chest pain.  The patient has no history of coronary artery disease or congestive heart failure but reports that around 1 week ago he started having left arm pain and numbness with exercise which was relieved with rest. He stated he had a bout of food poisoning the week before and was consisently vomiting.  He was hospitalized at that time.  He then woke up during the night feeling like he was choking, therefore he reported to the emergency department.  In the emergency room he was found to have pulmonary edema and his BNP was 2703.  Peak troponin was 0.09.  Cardiology was consulted for assistance.  Cardiac catheterization was performed on  05/05/2018 which showed severe multivessel coronary artery disease including diffuse proximal RCA disease with 90% stenosis and 100% at the mid RCA.  Proximal LAD lesion has 99% stenosis and proximal circumflex lesion has 95% stenosis.  There is 90% stenosis of the second OM branch of the circumflex.  There is severe (4+) mitral regurgitation.  Echocardiogram performed on 05/04/2018 showed an estimated left ejection fraction of 20 to 25%.  There was severe diffuse hypokinesis.  He is currently chest pain-free.  We are consulted for possible surgical revascularization and/or mitral valve repair /replacement.    Hospital Course:  Michael Banks is a 67 year old male patient who underwent a coronary bypass grafting x5 on 05/13/2018 with Dr. Dorris FetchHendrickson.  He tolerated the procedure well and was transferred to the surgical ICU.  He was extubated in a timely manner.  Postop day 1 he had some mild incisional soreness.  He was maintaining AAI paced rhythm with stable hemodynamics on neo-and dopamine.  We were supporting his blood pressure with milrinone 0.3.  He was breathing comfortably with 4 L of oxygen with good oxygen saturation.  We discontinued his lines.  We weaned pressors and milrinone slowly.  We started Coumadin slowly.  Postop day 2 he continued to improve.  He did have some left lower lobe atelectasis with possible small effusion.  We continue to encourage incentive spirometry.  We discontinued his chest tubes.  We initiated a diuretic regimen for fluid overload.  Postop day 3 his INR climbed to over 2 so we decreased his dose to 1 mg of daily Coumadin.  His kidney function remained stable.  We discontinued milrinone.  Postop day 4 he was tolerating 2 L nasal cannula.  We were weaning as tolerated.  His creatinine remained stable.  Continued his diuretic regimen.  He was hyperkalemic therefore we held potassium supplementation.  We held his Coumadin since it was 2.45 so that we could pull his pacing wires the  following day.  On 05/18/2018 we removed his epicardial pacing wires.  We will resume Coumadin the following day.  We continue to wean his oxygen requirements as tolerated.  We worked on ambulation in [employer].  He did have a brief episode of atrial fibrillation but once amiodarone was initiated he converted to sinus bradycardia and the amiodarone was stopped.  He has been maintaining normal sinus rhythm since then.  Incisions are noted to be healing well without signs of infection.  Home oxygen will be arranged at time of discharge.  He is tolerating diet.  Is tolerating routine activities, answered for level of postoperative convalescence using standard cardiac rehab protocols.  At the time of discharge the patient is felt quite stable.   Consults: cardiology  Significant Diagnostic Studies:   CLINICAL DATA:  Shortness of breath on exertion  EXAM: CHEST - 2 VIEW  COMPARISON:  05/16/2018  FINDINGS: Cardiac shadow is enlarged but stable. Postsurgical changes are again seen. Bilateral pleural effusions left greater than right are again noted and stable given some variation in the technical factors. Bibasilar atelectasis is present. No new focal infiltrate is seen.  IMPRESSION: No significant interval change from the previous day.   Electronically Signed   By: Alcide Clever M.D.   On: 05/17/2018 08:42   Treatments:    NAME: JAYTHAN, HINELY MEDICAL RECORD [id number] ACCOUNT [id number] DATE OF BIRTH:10/28/1950 FACILITY: [facility] LOCATION: [facility] PHYSICIAN:Inman Fettig C. Stephens Shreve, MD  OPERATIVE REPORT  DATE OF PROCEDURE:  05/13/2018  PREOPERATIVE DIAGNOSIS:  Severe 3-vessel coronary disease with ischemic cardiomyopathy and severe mitral regurgitation with class IV congestive heart failure.  POSTOPERATIVE DIAGNOSIS:  Severe 3-vessel coronary disease with ischemic cardiomyopathy and severe mitral regurgitation with class IV congestive heart failure.  PROCEDURE:   Median sternotomy, extracorporeal circulation coronary artery bypass grafting x5 (left internal mammary artery to the left anterior descending, sequential saphenous vein graft to obtuse marginals 1 and 2, saphenous vein graft to first  diagonal, saphenous vein graft to posterior descending), mitral valve annuloplasty with Edwards Physio II annuloplasty ring (model 5200, serial [id number]), endoscopic vein harvest right leg.  SURGEON:  Charlett Lango, MD  ASSISTANT:  Jari Favre, PA-C   ANESTHESIA:  General.  FINDINGS:  Transesophageal echocardiography showed left ventricular dilatation with severe mitral regurgitation, ejection fraction of approximately 20%, improved LV wall motion post bypass.  No residual mitral regurgitation post bypass.  INTRAOPERATIVE FINDINGS:  Severe cardiomegaly.  Fair quality coronary targets.  Fair quality vein.  Good quality mammary artery.   Discharge Exam: Blood pressure (!) 112/55, pulse 70, temperature 97.6 F (36.4 C), temperature source Oral, resp. rate 20, height 5\' 10"  (1.778 m), weight 83.8 kg, SpO2 94 %.  General appearance: alert, cooperative and no distress Heart: regular rate and rhythm Lungs: mildly dim in bases Abdomen: benign Extremities: min edema Wound: incis healing well    Disposition: Discharge disposition: 01-Home or Self Care       Discharge Instructions    Amb Referral to Cardiac  Rehabilitation   Complete by:  As directed    Diagnosis:   CABG Valve Repair     Valve:  Mitral   CABG X ___:  5   Discharge patient   Complete by:  As directed    When home O2 arrangements have been made   Discharge disposition:  01-Home or Self Care   Discharge patient date:  05/19/2018     Allergies as of 05/19/2018   No Known Allergies     Medication List    STOP taking these medications   NYQUIL COLD & FLU PO   ondansetron 4 MG disintegrating tablet Commonly known as:  ZOFRAN-ODT     TAKE these medications   aspirin  81 MG EC tablet Take 1 tablet (81 mg total) by mouth daily. Start taking on:  05/20/2018   atorvastatin 80 MG tablet Commonly known as:  LIPITOR Take 1 tablet (80 mg total) by mouth daily at 6 PM.   carvedilol 3.125 MG tablet Commonly known as:  COREG Take 1 tablet (3.125 mg total) by mouth 2 (two) times daily with a meal.   furosemide 40 MG tablet Commonly known as:  LASIX Take 1 tablet (40 mg total) by mouth daily. Start taking on:  05/20/2018   lisinopril 5 MG tablet Commonly known as:  PRINIVIL,ZESTRIL Take 1 tablet (5 mg total) by mouth daily. Start taking on:  05/20/2018   oxyCODONE 5 MG immediate release tablet Commonly known as:  Oxy IR/ROXICODONE Take 1-2 tablets (5-10 mg total) by mouth every 6 (six) hours as needed for up to 7 days for severe pain.   Potassium Chloride ER 20 MEQ Tbcr Take 10 mEq by mouth daily.   warfarin 2.5 MG tablet Commonly known as:  COUMADIN Take 1 tablet (2.5 mg total) by mouth daily at 6 PM. As directed by coumadin clinic            Durable Medical Equipment  (From admission, onward)         Start     Ordered   05/19/18 1457  For home use only DME oxygen  Once    Question Answer Comment  Mode or (Route) Nasal cannula   Liters per Minute 2   Frequency Continuous (stationary and portable oxygen unit needed)   Oxygen conserving device Yes   Oxygen delivery system Gas      05/18/18 1457   05/19/18 0934  For home use only DME oxygen  Once    Question Answer Comment  Mode or (Route) Nasal cannula   Liters per Minute 2   Frequency Continuous (stationary and portable oxygen unit needed)   Oxygen delivery system Gas      05/19/18 0934         Follow-up Information    Lars Masson, MD. Call in 1 day(s).   Specialty:  Cardiology Contact information: [address] [place] [address] [phone]        Loreli Slot, MD Follow up.   Specialty:  Cardiothoracic Surgery Why:  Your  routine follow-up appointment is on 06/20/2018 at 1:00pm. Please arrive at 12:30pm for a chest xray located at [facility] which is on the first floor of our building. Contact information: [address] [employer] [address] [place] [address] [phone]        Dyann Kief, PA-C Follow up.   Specialty:  Cardiology Why:  Jacolyn Reedy, PA-C 12/11 @1 :30 pm ([facility])  The cardiology office  will also contact you regarding a Coumadin clinic appointment for early next week.  If you do not call you by Monday please contact them to arrange. Contact information: [address] [place] [address] [phone]          The patient has been discharged on:   1.Beta Blocker:  Yes [ yes  ]                              No   [   ]                              If No, reason:  2.Ace Inhibitor/ARB: Yes [ yes  ]                                     No  [    ]                                     If No, reason:  3.Statin:   Yes [ yes  ]                  No  [   ]                  If No, reason:  4.Ecasa:  Yes  [ yes  ]                  No   [   ]                  If No, reason:    Signed: Rowe Clack 05/19/2018, 9:58 AM

## 2018-05-18 NOTE — Progress Notes (Signed)
CARDIAC REHAB PHASE I   PRE:  Rate/Rhythm: 66 SR     SaO2: 96% 3L  MODE:  Ambulation: 400 ft   POST:  Rate/Rhythm: 75 SR  BP:  Supine:   Sitting: 108/65  Standing:    SaO2: 83%RA , 86% 2L , 90% 3L 1322-1420 See qualifying note for home oxygen. Pt needed encouragement to walk as he stated he was very tired and had been up during the night. Discussed with pt the importance of walking and that we needed to see if he needed oxygen for home. Pt walked 400 ft with rolling walker and needed 3L to keep sats up . Would also recommend rolling walker for home use. Pt stated that he needed to talk with case manager as he lives in [facility]isted Living. His brother works until 8 pm every night so he would not be available during the day. Notified pt's RN that he needs to see case manager and that at this time he needs home oxygen. Reviewed staying in the tube, importance of IS, walking instructions, and CRP 2. Referred to [place] CRP 2. Pt stated he has quit smoking. Handout given. Wrote down how to view discharge video. Gave heart healthy diet and discussed watching carbs and sodium also. Pt needs reinforcement to keep arms close to body with movement.    Luetta Nuttingharlene Mordecai Tindol, RN BSN  05/18/2018 2:14 PM

## 2018-05-18 NOTE — Care Management Important Message (Signed)
Important Message  Patient Details  Name: Michael Banks E Fredenburg MRN: [id number] Date of Birth: 1950-12-06   Medicare Important Message Given:  Yes    Oralia RudMegan P Keenya Matera 05/18/2018, 3:53 PM

## 2018-05-18 NOTE — Progress Notes (Signed)
Pacing wires removed. Wires intact. Pt tolerated well. Pt agrees to 1 hr bedrest. All incisions cleaned with Betadine.  Vitals stable. Will continue to monitor. Lacy DuverneyJennifer Alyda Megna

## 2018-05-18 NOTE — Progress Notes (Signed)
SATURATION QUALIFICATIONS: (This note is used to comply with regulatory documentation for home oxygen)  Patient Saturations on Room Air at Rest = 83%  Patient Saturations on Room Air while Ambulating = 83%  Patient Saturations on 3 Liters of oxygen while Ambulating = 90%  Please briefly explain why patient needs home oxygen:  Took 3L to keep sats up walking

## 2018-05-18 NOTE — Progress Notes (Signed)
Patient states he will contact his ex-wife for discharge care. Will make staff aware of discharge plan.

## 2018-05-18 NOTE — Progress Notes (Addendum)
Called PA Tessa after speaking with RN Charlene in cardiac rehab. Pt will need oxygen at discharge and she will place documentation note. Pt originally stated he would stay with his brother after discharge. He currently stays in ALF. He will not be able to stay with his brother who works daily till 8 pm?  Will need consult to address discharge needs. Pt resting with call bell within reach.  Will continue to monitor. Michael Banks, Michael Klich McClintock, RN

## 2018-05-19 LAB — BASIC METABOLIC PANEL
ANION GAP: 7 (ref 5–15)
BUN: 18 mg/dL (ref 8–23)
CALCIUM: 8.4 mg/dL — AB (ref 8.9–10.3)
CO2: 25 mmol/L (ref 22–32)
CREATININE: 0.88 mg/dL (ref 0.61–1.24)
Chloride: 104 mmol/L (ref 98–111)
GFR calc Af Amer: >60 mL/min (ref >60)
GFR calc non Af Amer: >60 mL/min (ref >60)
GLUCOSE: 163 mg/dL — AB (ref 70–99)
Potassium: 4.3 mmol/L (ref 3.5–5.1)
Sodium: 136 mmol/L (ref 135–145)

## 2018-05-19 LAB — PROTIME-INR
INR: 1.73
Prothrombin Time: 20.1 seconds — ABNORMAL HIGH (ref 11.4–15.2)

## 2018-05-19 LAB — CBC
HCT: 34.4 % — ABNORMAL LOW (ref 39.0–52.0)
Hemoglobin: 10.6 g/dL — ABNORMAL LOW (ref 13.0–17.0)
MCH: 29.3 pg (ref 26.0–34.0)
MCHC: 30.8 g/dL (ref 30.0–36.0)
MCV: 95 fL (ref 80.0–100.0)
NRBC: 0 % (ref 0.0–0.2)
Platelets: 309 10*3/uL (ref 150–400)
RBC: 3.62 MIL/uL — ABNORMAL LOW (ref 4.22–5.81)
RDW: 13.8 % (ref 11.5–15.5)
WBC: 8 10*3/uL (ref 4.0–10.5)

## 2018-05-19 LAB — GLUCOSE, CAPILLARY
Glucose-Capillary: 140 mg/dL — ABNORMAL HIGH (ref 70–99)
Glucose-Capillary: 143 mg/dL — ABNORMAL HIGH (ref 70–99)

## 2018-05-19 MED ORDER — FUROSEMIDE 40 MG PO TABS: 40.0000 mg | ORAL_TABLET | Freq: Every day | ORAL | 1 refills | Status: DC

## 2018-05-19 MED ORDER — OXYCODONE HCL 5 MG PO TABS: 5.0000 mg | ORAL_TABLET | Freq: Four times a day (QID) | ORAL | 0 refills | Status: AC | PRN

## 2018-05-19 MED ORDER — CARVEDILOL 3.125 MG PO TABS: 3.1250 mg | ORAL_TABLET | Freq: Two times a day (BID) | ORAL | Status: DC

## 2018-05-19 MED ORDER — ASPIRIN 81 MG PO TBEC: 81.0000 mg | DELAYED_RELEASE_TABLET | Freq: Every day | ORAL | Status: AC

## 2018-05-19 MED ORDER — LISINOPRIL 5 MG PO TABS: 5.0000 mg | ORAL_TABLET | Freq: Every day | ORAL | 1 refills | Status: DC

## 2018-05-19 MED ORDER — WARFARIN SODIUM 2.5 MG PO TABS: 2.5000 mg | ORAL_TABLET | Freq: Every day | ORAL | 1 refills | Status: DC

## 2018-05-19 MED ORDER — WARFARIN SODIUM 2.5 MG PO TABS: 2.5000 mg | ORAL_TABLET | Freq: Every day | ORAL | Status: DC

## 2018-05-19 MED ORDER — POTASSIUM CHLORIDE ER 20 MEQ PO TBCR: 10.0000 meq | EXTENDED_RELEASE_TABLET | Freq: Every day | ORAL | 1 refills | Status: DC

## 2018-05-19 MED ORDER — CARVEDILOL 3.125 MG PO TABS: 3.1250 mg | ORAL_TABLET | Freq: Two times a day (BID) | ORAL | 1 refills | Status: DC

## 2018-05-19 MED ORDER — ATORVASTATIN CALCIUM 80 MG PO TABS: 80.0000 mg | ORAL_TABLET | Freq: Every day | ORAL | 1 refills | Status: DC

## 2018-05-19 NOTE — Progress Notes (Addendum)
[address]       [place] [address]             [phone]      6 Days Post-Op Procedure(s) (LRB): CORONARY ARTERY BYPASS GRAFTING (CABG) times _five__ using left internal mammary artery and right  greater saphenous vein harvested endoscopically. (N/A) Mitral Valve Repair (N/A) TRANSESOPHAGEAL ECHOCARDIOGRAM (TEE) (N/A) Subjective: Feels better, qualifies for home O2 Won't qualify for SNF at level of activity Says he has 24/7 assistance at home  Objective: Vital signs in last 24 hours: Temp:  [97.4 F (36.3 C)-97.6 F (36.4 C)] 97.6 F (36.4 C) (11/28 0341) Pulse Rate:  [58-86] 70 (11/28 0843) Cardiac Rhythm: Sinus bradycardia (11/28 0851) Resp:  [16-26] 20 (11/28 0341) BP: (86-115)/(55-72) 112/55 (11/28 0843) SpO2:  [79 %-99 %] 94 % (11/28 0341) Weight:  [83.8 kg] 83.8 kg (11/28 0342)  Hemodynamic parameters for last 24 hours:    Intake/Output from previous day: 11/27 0701 - 11/28 0700 In: 360 [P.O.:360] Out: -  Intake/Output this shift: No intake/output data recorded.  General appearance: alert, cooperative and no distress Heart: regular rate and rhythm Lungs: mildly dim in bases Abdomen: benign Extremities: min edema Wound: incis healing well  Lab Results: Recent Labs    05/17/18 0333 05/19/18 0301  WBC 10.0 8.0  HGB 9.9* 10.6*  HCT 32.6* 34.4*  PLT 217 309   BMET:  Recent Labs    05/18/18 0323 05/19/18 0301  NA 136 136  K 5.1 4.3  CL 106 104  CO2 22 25  GLUCOSE 175* 163*  BUN 23 18  CREATININE 1.02 0.88  CALCIUM 8.8* 8.4*    PT/INR:  Recent Labs    05/19/18 0301  LABPROT 20.1*  INR 1.73   ABG    Component Value Date/Time   PHART 7.365 05/14/2018 0705   HCO3 17.9 (L) 05/14/2018 0705   TCO2 22 05/14/2018 1619   ACIDBASEDEF 7.0 (H) 05/14/2018 0705   O2SAT 64.2 05/15/2018 0430   CBG (last 3)  Recent Labs    05/18/18 1645 05/18/18 2115 05/19/18 0625  GLUCAP 175* 139* 140*    Meds Scheduled  Meds: . aspirin EC  81 mg Oral Daily  . atorvastatin  80 mg Oral q1800  . carvedilol  6.25 mg Oral BID WC  . docusate sodium  200 mg Oral Daily  . furosemide  40 mg Oral Daily  . insulin aspart  0-15 Units Subcutaneous TID WC  . lisinopril  5 mg Oral Daily  . pantoprazole  40 mg Oral Daily  . sodium chloride flush  3 mL Intravenous Q12H  . sodium phosphate  1 enema Rectal Once  . warfarin  1 mg Oral q1800  . Warfarin - Physician Dosing Inpatient   Does not apply q1800   Continuous Infusions: . sodium chloride     PRN Meds:.sodium chloride, alum & mag hydroxide-simeth, lactulose, magnesium hydroxide, ondansetron (ZOFRAN) IV, oxyCODONE, promethazine, senna, sodium chloride flush, traMADol  Xrays No results found.  Assessment/Plan: S/P Procedure(s) (LRB): CORONARY ARTERY BYPASS GRAFTING (CABG) times _five__ using left internal mammary artery and right  greater saphenous vein harvested endoscopically. (N/A) Mitral Valve Repair (N/A) TRANSESOPHAGEAL ECHOCARDIOGRAM (TEE) (N/A)  1 hemodyn stable in sinus rhythm, some sinus brady- tolerating well, BP in low 100's mostly- will decrease coreg back to 3.125. Will keep lisinopril at low dose(5mg ) 2 qualifies for home O2 - will arrange for at discharge 3 INR is 1.73 - will increase  coumadin dose 4 renal fxn remains within normal limits- conts with some volume overload and small effusions- cont lasix with low EF as well 5 d/c to home today  LOS: 16 days    Rowe ClackWayne E Gold PA-C 05/19/2018 Pager [phone]  nsr Incisions clean, dry INR goal 2.0-2.5 DC instructions reviewed with patient patient examined and medical record reviewed,agree with above note. Kathlee Nationseter Van Trigt III 05/19/2018

## 2018-05-19 NOTE — Progress Notes (Signed)
Patient given discharge instructions medication list and paper prescriptions. Along with follow up appointments. Patient verbalized understanding. IV and tele were dcd will discharge home as ordered with 02. Transported to exit with nursing staff and wheel chair. Maleke Feria, Randall AnKristin Jessup RN

## 2018-05-19 NOTE — Progress Notes (Signed)
CT stures removed as ordered. Charidy Cappelletti, Randall AnKristin Jessup RN

## 2018-05-19 NOTE — Care Management Note (Addendum)
Case Management Note Donn PieriniKristi Briannie Gutierrez RN, BSN Transitions of Care Unit 4E- RN Case Manager [phone]  Patient Details  Name: [name] E Lassalle MRN: [id number] Date of Birth: [birth year]  Subjective/Objective:      Pt admitted s/p MVR and CABG              Action/Plan: PTA pt lived at home, for transition home today, order placed for home 02, [facility]- portable tank to be delivered to room prior to discharge.   Expected Discharge Date:  05/19/18               Expected Discharge Plan:  Home/Self Care  In-House Referral:  NA  Discharge planning Services  CM Consult  Post Acute Care Choice:  Durable Medical Equipment Choice offered to:     DME Arranged:  Oxygen DME Agency:  [facility]  HH Arranged:   [facility], [facility]   [facility]  Status of Service:  Completed, signed off  If discussed at Long Length of Stay Meetings, dates discussed:    Discharge Disposition: home/Home Health   Additional Comments:  05/20/18- 1030- Roan Miklos RN, CM- [facility]upita Leash- Donna[facility]- per Lupita Leashonna referral has already been pulled from epic and RN scheduled to go out to see pt.   05/19/18- 1300- Donn PieriniKristi Pio Eatherly RN, CM- notified by beside RN that [facility]- spoke with pt for choice - [facility]- will make referral on 11/29 due to Holiday. Pt reports that he will be staying with ex-wife for a short while- address is - [address] [place] [address] phone: [phone]  Darrold SpanWebster, Tylor Gambrill Hall, RN 05/19/2018, 10:09 AM

## 2018-05-20 MED FILL — Mannitol IV Soln 20%: INTRAVENOUS | Qty: 500 | Status: AC

## 2018-05-20 MED FILL — Lidocaine HCl(Cardiac) IV PF Soln Pref Syr 100 MG/5ML (2%): INTRAVENOUS | Qty: 5 | Status: AC

## 2018-05-20 MED FILL — Heparin Sodium (Porcine) Inj 1000 Unit/ML: INTRAMUSCULAR | Qty: 20 | Status: AC

## 2018-05-20 MED FILL — Heparin Sodium (Porcine) Inj 1000 Unit/ML: INTRAMUSCULAR | Qty: 30 | Status: AC

## 2018-05-20 MED FILL — Sodium Chloride IV Soln 0.9%: INTRAVENOUS | Qty: 2000 | Status: AC

## 2018-05-20 MED FILL — Calcium Chloride Inj 10%: INTRAVENOUS | Qty: 10 | Status: AC

## 2018-05-20 MED FILL — Sodium Bicarbonate IV Soln 8.4%: INTRAVENOUS | Qty: 50 | Status: AC

## 2018-05-20 MED FILL — Electrolyte-R (PH 7.4) Solution: INTRAVENOUS | Qty: 4000 | Status: AC

## 2018-05-23 LAB — POCT I-STAT, CHEM 8
BUN: 5 mg/dL — ABNORMAL LOW (ref 8–23)
Calcium, Ion: 1.07 mmol/L — ABNORMAL LOW (ref 1.15–1.40)
Chloride: 101 mmol/L (ref 98–111)
Creatinine, Ser: 0.6 mg/dL — ABNORMAL LOW (ref 0.61–1.24)
GLUCOSE: 126 mg/dL — AB (ref 70–99)
HCT: 29 % — ABNORMAL LOW (ref 39.0–52.0)
Hemoglobin: 9.9 g/dL — ABNORMAL LOW (ref 13.0–17.0)
Potassium: 4.4 mmol/L (ref 3.5–5.1)
Sodium: 136 mmol/L (ref 135–145)
TCO2: 28 mmol/L (ref 22–32)

## 2018-05-24 ENCOUNTER — Telehealth: Payer: Self-pay

## 2018-05-24 DIAGNOSIS — I420 Dilated cardiomyopathy: Secondary | ICD-10-CM | POA: Diagnosis not present

## 2018-05-24 DIAGNOSIS — E785 Hyperlipidemia, unspecified: Secondary | ICD-10-CM | POA: Diagnosis not present

## 2018-05-24 DIAGNOSIS — E119 Type 2 diabetes mellitus without complications: Secondary | ICD-10-CM | POA: Diagnosis not present

## 2018-05-24 DIAGNOSIS — Z9889 Other specified postprocedural states: Secondary | ICD-10-CM | POA: Diagnosis not present

## 2018-05-24 DIAGNOSIS — Z48812 Encounter for surgical aftercare following surgery on the circulatory system: Secondary | ICD-10-CM | POA: Diagnosis not present

## 2018-05-24 DIAGNOSIS — I110 Hypertensive heart disease with heart failure: Secondary | ICD-10-CM | POA: Diagnosis not present

## 2018-05-24 DIAGNOSIS — Z951 Presence of aortocoronary bypass graft: Secondary | ICD-10-CM | POA: Diagnosis not present

## 2018-05-24 DIAGNOSIS — I509 Heart failure, unspecified: Secondary | ICD-10-CM | POA: Diagnosis not present

## 2018-05-24 DIAGNOSIS — I25119 Atherosclerotic heart disease of native coronary artery with unspecified angina pectoris: Secondary | ICD-10-CM | POA: Diagnosis not present

## 2018-05-24 DIAGNOSIS — I255 Ischemic cardiomyopathy: Secondary | ICD-10-CM | POA: Diagnosis not present

## 2018-05-24 NOTE — Telephone Encounter (Signed)
Cala BradfordKimberly, RN with [facility] [phone] contacted the office requesting verbal orders for nursing following CABG x5 with Dr. Dorris FetchHendrickson on 05/13/18.  Verbal orders given, will await faxed sheet for physician signature.

## 2018-05-26 ENCOUNTER — Telehealth (HOSPITAL_COMMUNITY): Payer: Self-pay

## 2018-05-26 ENCOUNTER — Telehealth: Payer: Self-pay | Admitting: Pharmacist

## 2018-05-26 NOTE — Telephone Encounter (Signed)
Attempted to call patient in regards to Cardiac Rehab - LM on VM 

## 2018-05-26 NOTE — Telephone Encounter (Signed)
Received message a few days ago from Trish in the hospital that pt needed new Coumadin appt set up. Both scheduling and I have tried to reach pt unsuccessfully. Left another message.

## 2018-05-26 NOTE — Telephone Encounter (Signed)
Pt insurance is active and benefits verified through [facility]. Co-pay $45.00, DED $0.00/$0.00 met, out of pocket $4,200.00/$245.00 met, co-insurance 0%. No pre-authorization required. Christina/[employer], 05/26/18 @ 2:38PM, REF# [id number]  Will contact patient to see if he is interested in the Cardiac Rehab Program. If interested, patient will need to complete follow up appt. Once completed, patient will be contacted for scheduling upon review by the RN Navigator.

## 2018-05-27 DIAGNOSIS — I5041 Acute combined systolic (congestive) and diastolic (congestive) heart failure: Secondary | ICD-10-CM | POA: Diagnosis not present

## 2018-05-27 DIAGNOSIS — I509 Heart failure, unspecified: Secondary | ICD-10-CM | POA: Diagnosis not present

## 2018-05-27 NOTE — Telephone Encounter (Signed)
LMOMTCB to make appt.

## 2018-05-30 ENCOUNTER — Encounter (INDEPENDENT_AMBULATORY_CARE_PROVIDER_SITE_OTHER): Payer: Self-pay

## 2018-05-30 ENCOUNTER — Ambulatory Visit: Payer: Medicare HMO | Admitting: Pharmacist

## 2018-05-30 DIAGNOSIS — Z5181 Encounter for therapeutic drug level monitoring: Secondary | ICD-10-CM | POA: Diagnosis not present

## 2018-05-30 LAB — POCT INR: INR: 1.3 — AB (ref 2.0–3.0)

## 2018-05-30 NOTE — Patient Instructions (Addendum)
Description   Take 1.5 tablets today and tomorrow then start taking 1 tablet daily, except 1.5 tablets on Mondays and Fridays. Recheck INR in 1 week. Main [phone] Coumadin [phone]     A full discussion of the nature of anticoagulants has been carried out.  A benefit risk analysis has been presented to the patient, so that they understand the justification for choosing anticoagulation at this time. The need for frequent and regular monitoring, precise dosage adjustment and compliance is stressed.  Side effects of potential bleeding are discussed.  The patient should avoid any OTC items containing aspirin or ibuprofen, and should avoid great swings in general diet.  Avoid alcohol consumption.  Call if any signs of abnormal bleeding.

## 2018-05-31 ENCOUNTER — Telehealth: Payer: Self-pay

## 2018-05-31 NOTE — Telephone Encounter (Signed)
Tomma LightningFrankie, PT with [facility] contacted the office [phone] requesting additional verbal orders for PT due to patient being admitted to the hospital.  Requesting 1 time a week for 1 week, 2 times a week for 2 weeks, and 1 time a week for 2 weeks.  Verbal orders giving over the phone via voicemail.  Will await faxed copy for physician signature.

## 2018-06-01 ENCOUNTER — Ambulatory Visit: Payer: Medicare HMO | Admitting: Physician Assistant

## 2018-06-01 ENCOUNTER — Telehealth: Payer: Self-pay | Admitting: *Deleted

## 2018-06-01 ENCOUNTER — Encounter: Payer: Self-pay | Admitting: Physician Assistant

## 2018-06-01 DIAGNOSIS — E785 Hyperlipidemia, unspecified: Secondary | ICD-10-CM | POA: Insufficient documentation

## 2018-06-01 DIAGNOSIS — I10 Essential (primary) hypertension: Secondary | ICD-10-CM | POA: Insufficient documentation

## 2018-06-01 DIAGNOSIS — I5022 Chronic systolic (congestive) heart failure: Secondary | ICD-10-CM | POA: Insufficient documentation

## 2018-06-01 DIAGNOSIS — I255 Ischemic cardiomyopathy: Secondary | ICD-10-CM | POA: Insufficient documentation

## 2018-06-01 DIAGNOSIS — Z9889 Other specified postprocedural states: Secondary | ICD-10-CM | POA: Insufficient documentation

## 2018-06-01 NOTE — Progress Notes (Deleted)
Cardiology Office Note    Date:  06/01/2018   ID:  Michael Banks, DOB 09/24/1950, MRN [id number]  PCP:  Lorenda Ishihara, MD  Cardiologist: Tobias Alexander, MD EPS: None  No chief complaint on file.   History of Present Illness:  Michael Banks is a 67 y.o. male with history of HTN, HLD, tobacco abuse admitted with CHF and found to have EF 20-25% with mod MR and cath 3VCAD. Patient underwent CABGx5 and MV repair and annuloplasty ring 05/14/18.  ACE inhibitor/ARB/Spironolactone was not added in the hospital because of rising creatinine and low normal blood pressures.  Patient will need lifelong SBE prophylaxis    Past Medical History:  Diagnosis Date  . Dilated cardiomyopathy [facility])     Past Surgical History:  Procedure Laterality Date  . CORONARY ARTERY BYPASS GRAFT N/A 05/13/2018   Procedure: CORONARY ARTERY BYPASS GRAFTING (CABG) times _five__ using left internal mammary artery and right  greater saphenous vein harvested endoscopically.;  Surgeon: Loreli Slot, MD;  Location: [facility] OR;  Service: Open Heart Surgery;  Laterality: N/A;  . KNEE ARTHROSCOPY    . MITRAL VALVE REPAIR N/A 05/13/2018   Procedure: Mitral Valve Repair;  Surgeon: Loreli Slot, MD;  Location[facility] OR;  Service: Open Heart Surgery;  Laterality: N/A;  . NOSE SURGERY    . RIGHT/LEFT HEART CATH AND CORONARY ANGIOGRAPHY N/A 05/05/2018   Procedure: RIGHT/LEFT HEART CATH AND CORONARY ANGIOGRAPHY;  Surgeon: Marykay Lex, MD;  Location: [facility] INVASIVE CV LAB;  Service: Cardiovascular;  Laterality: N/A;  . TEE WITHOUT CARDIOVERSION N/A 05/13/2018   Procedure: TRANSESOPHAGEAL ECHOCARDIOGRAM (TEE);  Surgeon: Loreli Slot, MD;  Location: [facility] OR;  Service: Open Heart Surgery;  Laterality: N/A;  . ULTRASOUND GUIDANCE FOR VASCULAR ACCESS  05/05/2018   Procedure: Ultrasound Guidance For Vascular Access;  Surgeon: Marykay Lex, MD;  Location: [facility] INVASIVE CV LAB;  Service: Cardiovascular;;     Current Medications: No outpatient medications have been marked as taking for the 06/01/18 encounter (Appointment) with Dyann Kief, PA-C.     Allergies:   Patient has no known allergies.   Social History   Socioeconomic History  . Marital status: Single    Spouse name: Not on file  . Number of children: Not on file  . Years of education: Not on file  . Highest education level: Not on file  Occupational History  . Not on file  Social Needs  . Financial resource strain: Not on file  . Food insecurity:    Worry: Not on file    Inability: Not on file  . Transportation needs:    Medical: Not on file    Non-medical: Not on file  Tobacco Use  . Smoking status: Former Games developer  . Smokeless tobacco: Never Used  Substance and Sexual Activity  . Alcohol use: Never    Frequency: Never  . Drug use: Not on file  . Sexual activity: Not on file  Lifestyle  . Physical activity:    Days per week: Not on file    Minutes per session: Not on file  . Stress: Not on file  Relationships  . Social connections:    Talks on phone: Not on file    Gets together: Not on file    Attends religious service: Not on file    Active member of club or organization: Not on file    Attends meetings of clubs or organizations: Not on file    Relationship status:  Not on file  Other Topics Concern  . Not on file  Social History Narrative  . Not on file     Family History:  The patient's family history includes CAD in his brother; CVA in his father.   ROS:   Please see the history of present illness.    ROS All other systems reviewed and are negative.   PHYSICAL EXAM:   VS:  There were no vitals taken for this visit.  Physical Exam  GEN: Well nourished, well developed, in no acute distress  Neck: no JVD, carotid bruits, or masses Cardiac:RRR; no murmurs, rubs, or gallops  Respiratory:  clear to auscultation bilaterally, normal work of breathing GI: soft, nontender, nondistended, +  BS Ext: without cyanosis, clubbing, or edema, Good distal pulses bilaterally Neuro:  Alert and Oriented x 3 Psych: euthymic mood, full affect  Wt Readings from Last 3 Encounters:  05/19/18 184 lb 11.9 oz (83.8 kg)      Studies/Labs Reviewed:   EKG:  EKG is*** ordered today.  The ekg ordered today demonstrates ***  Recent Labs: 05/03/2018: B Natriuretic Peptide 2,703.3 05/12/2018: ALT 30 05/14/2018: Magnesium 2.3 05/19/2018: BUN 18; Creatinine, Ser 0.88; Hemoglobin 10.6; Platelets 309; Potassium 4.3; Sodium 136   Lipid Panel    Component Value Date/Time   CHOL 129 05/04/2018 0433   TRIG 57 05/04/2018 0433   HDL 31 (L) 05/04/2018 0433   CHOLHDL 4.2 05/04/2018 0433   VLDL 11 05/04/2018 0433   LDLCALC 87 05/04/2018 0433    Additional studies/ records that were reviewed today include:  2D echo 11/13/2019Study Conclusions   - Left ventricle: The cavity size was moderately dilated. Systolic   function was severely reduced. The estimated ejection fraction   was in the range of 20% to 25%. Indeterminate diastolic function.   Severe diffuse hypokinesis. - Regional wall motion abnormality: Akinesis of the mid   inferolateral, basal-mid anterolateral, and apical lateral   myocardium. - Aortic valve: Transvalvular velocity was within the normal range.   There was no stenosis. There was no regurgitation. - Mitral valve: Mitral valve leaflet tenting. There was moderate   regurgitation. The PISA quantitation may slightly underestimate   severity, with one jet more central, and one jet eccentric and   anteriorly directed (clip 71). - Left atrium: The atrium was mildly dilated. - Right ventricle: The cavity size was mildly dilated. Wall   thickness was normal. Systolic function was moderately reduced. - Tricuspid valve: There was trivial regurgitation. - Inferior vena cava: The vessel was dilated. The respirophasic   diameter changes were blunted (< 50%), consistent with elevated    central venous pressure. - Pericardium, extracardiac: Pleural effusion.    Cardiac catheterization on 11/14/2019ISCHEMIC CARDIOMYOPATHY  Hemodynamic findings consistent with moderate pulmonary hypertension -secondary to pulmonary venous hypertension  LV end diastolic pressure and pulmonary capillary wedge pressure are moderately elevated.  There is severe (4+) mitral regurgitation.  SEVERE MULTIVESSEL DISEASE  Prox RCA to Mid RCA lesion is 90% stenosed. Mid RCA to Dist RCA lesion is 100% stenosed with 100% stenosed side branch in Post Atrio.  Prox LAD lesion is 99% stenosed. Mid LAD-1 lesion is 80% stenosed. Mid LAD-2 lesion is 90% stenosed. Dist LAD lesion is 85% stenosed.  Prox Cx lesion is 95% stenosed. 2nd Mrg lesion is 90% stenosed with 90% stenosed side branch in Lat 2nd Mrg.   SUMMARY  Severe multivessel CAD: Diffuse proximal RCA disease with mid 100% CTO filling with bridging collaterals and right-to-left  collaterals, separate ostium circumflex with proximal 95% stenosis and bifurcation 90% stenosis of OM1, 99% mid LAD after very large septal perforator trunk (perfusing PDA) with bridging collaterals filling mid to distal LAD with a large diagonal branch and diffuse disease throughout the distal LAD.  Moderate pulmonary hypertension likely secondary to pulmonary venous congestion and mitral regurgitation.  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     RECOMMENDATION  Return to nursing unit for TR band removal.  Continue aggressive CHF management  The patient would likely best benefit from CABG plus possible mitral valve repair. --We will consult CVTS  Will place on IV heparin 8 hours after sheath removal  Consider  viability study as part of assessment for revascularization options.  Percutaneous options would be limited and extremely high risk given his low output and multivessel disease.  Would likely need support with Impella  Recommend Aspirin 81mg  daily for severe CAD.         ASSESSMENT:    1. 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 S/P mitral valve repair   3. Ischemic cardiomyopathy   4. Chronic systolic heart failure (HCC)   5. Essential hypertension   6. Hyperlipidemia, unspecified hyperlipidemia type      PLAN:  In order of problems listed above:  CAD S/P CABG x 5 & MV Repair 05/14/18 will need lifelong SBE prophylaxis  Ischemic cardiomyopathy ejection fraction 20 to 25%  Chronic systolic CHF  Essential hypertension  Hyperlipidemia    Medication Adjustments/Labs and Tests Ordered: Current medicines are reviewed at length with the patient today.  Concerns regarding medicines are outlined above.  Medication changes, Labs and Tests ordered today are listed in the Patient Instructions below. There are no Patient Instructions on file for this visit.   Elson Clan, PA-C  06/01/2018 9:52 AM    [facility] [address] [place] [address], [place], [place]  [address] Phone: [phone]; Fax: [phone]

## 2018-06-01 NOTE — Telephone Encounter (Signed)
Per Jacolyn ReedyMichele Lenze, PA-C, tried to call pt re: missed appt. Left a message for him to call back and reschedule.

## 2018-06-02 ENCOUNTER — Telehealth: Payer: Self-pay

## 2018-06-02 NOTE — Telephone Encounter (Signed)
Patient contacted the office with a couple of questions.  He asked when he could sleep on his side.  I advised that he is ok to sleep on his side.  Also, he asked about his feet swelling.  I advised that he has been diagnosed with congestive heart failure in which he may always have some swelling in his feet.  But also reminded him to take his medication as directed.  Also, advised him to keep his feel elevated when seated, wear compression stockings, and also limit his salt intake.  Also advised to be mindful of his fluid intake as well.  He acknowledged receipt.

## 2018-06-03 ENCOUNTER — Telehealth: Payer: Self-pay

## 2018-06-03 DIAGNOSIS — I25119 Atherosclerotic heart disease of native coronary artery with unspecified angina pectoris: Secondary | ICD-10-CM | POA: Diagnosis not present

## 2018-06-03 DIAGNOSIS — Z951 Presence of aortocoronary bypass graft: Secondary | ICD-10-CM | POA: Diagnosis not present

## 2018-06-03 DIAGNOSIS — Z9889 Other specified postprocedural states: Secondary | ICD-10-CM | POA: Diagnosis not present

## 2018-06-03 DIAGNOSIS — I255 Ischemic cardiomyopathy: Secondary | ICD-10-CM | POA: Diagnosis not present

## 2018-06-03 DIAGNOSIS — E119 Type 2 diabetes mellitus without complications: Secondary | ICD-10-CM | POA: Diagnosis not present

## 2018-06-03 DIAGNOSIS — E785 Hyperlipidemia, unspecified: Secondary | ICD-10-CM | POA: Diagnosis not present

## 2018-06-03 DIAGNOSIS — Z48812 Encounter for surgical aftercare following surgery on the circulatory system: Secondary | ICD-10-CM | POA: Diagnosis not present

## 2018-06-03 DIAGNOSIS — I420 Dilated cardiomyopathy: Secondary | ICD-10-CM | POA: Diagnosis not present

## 2018-06-03 DIAGNOSIS — I110 Hypertensive heart disease with heart failure: Secondary | ICD-10-CM | POA: Diagnosis not present

## 2018-06-03 DIAGNOSIS — I509 Heart failure, unspecified: Secondary | ICD-10-CM | POA: Diagnosis not present

## 2018-06-03 NOTE — Telephone Encounter (Signed)
Jill/ Advance nurse called to report that Mr Michael Banks has bilateral lower leg and feet swelling edema +1. He developed streak like redness on both lower legs x 2 days, He denies any pain or loss of feeling from legs or feet. [facility] or fevers.  He is not on any new medications. He missed his appointment with Cardiology this week. He is going to call them and reschedule that appointment. Dr Dorris FetchHendrickson advised to keep legs up at heart level when he is not up walking.  also try compression stocking, and continue Lasix 40 mg daily. If the swelling and redness continue or get worse over the weekend please call back and we will move up his post op appt.

## 2018-06-06 ENCOUNTER — Ambulatory Visit: Payer: Medicare HMO | Admitting: *Deleted

## 2018-06-06 DIAGNOSIS — Z5181 Encounter for therapeutic drug level monitoring: Secondary | ICD-10-CM | POA: Diagnosis not present

## 2018-06-06 LAB — POCT INR: INR: 1.2 — AB (ref 2.0–3.0)

## 2018-06-06 NOTE — Patient Instructions (Signed)
Description   Take 2 tablets today and 1.5 tablets tomorrow then start taking 1.5 tablets daily except 1 tablet on Sundays, Tuesdays, and Thursdays. Recheck INR in 1 week. Main [phone] Coumadin [phone]

## 2018-06-13 ENCOUNTER — Other Ambulatory Visit: Payer: Self-pay | Admitting: Thoracic Surgery (Cardiothoracic Vascular Surgery)

## 2018-06-13 ENCOUNTER — Ambulatory Visit: Payer: Medicare HMO

## 2018-06-13 DIAGNOSIS — Z9889 Other specified postprocedural states: Secondary | ICD-10-CM

## 2018-06-13 DIAGNOSIS — Z5181 Encounter for therapeutic drug level monitoring: Secondary | ICD-10-CM | POA: Diagnosis not present

## 2018-06-13 DIAGNOSIS — Z951 Presence of aortocoronary bypass graft: Secondary | ICD-10-CM

## 2018-06-13 LAB — POCT INR: INR: 1.2 — AB (ref 2.0–3.0)

## 2018-06-13 MED ORDER — WARFARIN SODIUM 2.5 MG PO TABS: ORAL_TABLET | ORAL | 1 refills | Status: DC

## 2018-06-13 NOTE — Patient Instructions (Signed)
Description   Take 2 tablets today, then start taking 1.5 tablets daily. Recheck INR in 1 week. Main [phone] Coumadin [phone]

## 2018-06-20 ENCOUNTER — Ambulatory Visit
Admission: RE | Admit: 2018-06-20 | Discharge: 2018-06-20 | Disposition: A | Discharge status: Home / Self Care | Payer: Medicare HMO | Source: Ambulatory Visit | Attending: Thoracic Surgery (Cardiothoracic Vascular Surgery) | Admitting: Thoracic Surgery (Cardiothoracic Vascular Surgery)

## 2018-06-20 ENCOUNTER — Ambulatory Visit: Payer: Medicare HMO

## 2018-06-20 ENCOUNTER — Other Ambulatory Visit: Payer: Self-pay

## 2018-06-20 ENCOUNTER — Encounter: Payer: Self-pay | Admitting: Physician Assistant

## 2018-06-20 ENCOUNTER — Ambulatory Visit (INDEPENDENT_AMBULATORY_CARE_PROVIDER_SITE_OTHER): Payer: Self-pay | Admitting: Physician Assistant

## 2018-06-20 VITALS — BP 109/77 | HR 101 | Resp 16 | Ht 69.0 in | Wt 163.3 lb

## 2018-06-20 DIAGNOSIS — Z951 Presence of aortocoronary bypass graft: Secondary | ICD-10-CM

## 2018-06-20 DIAGNOSIS — Z9889 Other specified postprocedural states: Secondary | ICD-10-CM

## 2018-06-20 DIAGNOSIS — I25119 Atherosclerotic heart disease of native coronary artery with unspecified angina pectoris: Secondary | ICD-10-CM

## 2018-06-20 DIAGNOSIS — Z5181 Encounter for therapeutic drug level monitoring: Secondary | ICD-10-CM | POA: Diagnosis not present

## 2018-06-20 DIAGNOSIS — J9811 Atelectasis: Secondary | ICD-10-CM | POA: Diagnosis not present

## 2018-06-20 DIAGNOSIS — J90 Pleural effusion, not elsewhere classified: Secondary | ICD-10-CM | POA: Diagnosis not present

## 2018-06-20 DIAGNOSIS — I509 Heart failure, unspecified: Secondary | ICD-10-CM

## 2018-06-20 DIAGNOSIS — I340 Nonrheumatic mitral (valve) insufficiency: Secondary | ICD-10-CM

## 2018-06-20 LAB — POCT INR: INR: 1.3 — AB (ref 2.0–3.0)

## 2018-06-20 NOTE — Patient Instructions (Addendum)
Please make an appointment to see your medical doctor. Pre op HGA1C 7. You may need oral medication for diabetes and you will need further surveillance of HGA1C.  You may return to driving an automobile as long as you are no longer requiring oral narcotic pain relievers during the daytime.  It would be wise to start driving only short distances during the daylight and gradually increase from there as you feel comfortable.  You are encouraged to enroll and participate in the outpatient cardiac rehab program beginning as soon as practical.  You may continue to gradually increase your physical activity as tolerated.  Refrain from any heavy lifting or strenuous use of your arms and shoulders until at least 8 weeks from the time of your surgery, and avoid activities that cause increased pain in your chest on the side of your surgical incision.  Otherwise you may continue to increase activities without any particular limitations.  Increase the intensity and duration of physical activity gradually.

## 2018-06-20 NOTE — Addendum Note (Signed)
Addended by: Tommye StandardBURGESS, Genea Rheaume F on: 06/20/2018 01:50 PM   Modules accepted: Orders

## 2018-06-20 NOTE — Progress Notes (Signed)
HPI:  Patient returns for routine postoperative follow-up having undergone a CABG x 5 and mitral valve repair on 05/13/2018 by Dr. Dorris FetchHendrickson. The patient's early postoperative recovery while in the hospital was notable for brief a fib, which he converted quickly to sinus rhythm with Amiodarone. Since hospital discharge the patient reports, he still feels kind of sluggish and he "pees a lot". He sleeps in the recliner because it is more comfortable than the bed. He denies shortness of breath or chest pain. He uses oxygen sometimes at night.   Current Outpatient Medications  Medication Sig Dispense Refill  . aspirin EC 81 MG EC tablet Take 1 tablet (81 mg total) by mouth daily.    Marland Kitchen. atorvastatin (LIPITOR) 80 MG tablet Take 1 tablet (80 mg total) by mouth daily at 6 PM. 30 tablet 1  . carvedilol (COREG) 3.125 MG tablet Take 1 tablet (3.125 mg total) by mouth 2 (two) times daily with a meal. 60 tablet 1  . furosemide (LASIX) 40 MG tablet Take 1 tablet (40 mg total) by mouth daily. 30 tablet 1  . lisinopril (PRINIVIL,ZESTRIL) 5 MG tablet Take 1 tablet (5 mg total) by mouth daily. 30 tablet 1  . potassium chloride 20 MEQ TBCR Take 10 mEq by mouth daily. 30 tablet 1  . warfarin (COUMADIN) 2.5 MG tablet Take as directed by coumadin clinic 60 tablet 1  Vital Signs: BP 109/77, HR 101, RR 16, Oxygenation 91% on room air   Physical Exam: CV-Slightly tachycardic Pulmonary-Slightly diminished right base and clear on the left Abdomen-Soft, non tender, bowel sounds present Extremities-No LE edema Wounds-Suture removed from midline sternal incision. Chest tube wounds with eschars as well as small eschar on lower sternal wound.   Diagnostic Tests: CLINICAL DATA:  Recent CABG.  No chest complaints.  EXAM: CHEST - 2 VIEW  COMPARISON:  Chest x-ray dated May 17, 2018.  FINDINGS: Stable cardiomegaly status post CABG and MVR. Mild persistent interstitial edema in the right mid to lower lung.  Unchanged small right pleural effusion. Decreased now trace left pleural effusion. Bibasilar atelectasis, improved on the left. No pneumothorax. No acute osseous abnormality.  IMPRESSION: 1. Decreased now trace left and unchanged small right pleural effusions. 2. Bibasilar atelectasis, improved on the left. 3. Mild persistent interstitial edema in the right mid to lower lung.   Electronically Signed   By: Obie DredgeWilliam T Derry M.D.   On: 06/20/2018 12:37   Impression and Plan: Overall, Mr. Excell SeltzerCooper is continuing to recover from coronary artery bypass grafting surgery and mitral valve repair. He missed his cardiology appointment but has a new appointment for 1/15. He is on Carvedilol 3.125 mg bid, and with a pre op LVEF 20-25%, likely better than Metoprolol Tartrate;however, if he continue to has tachycardia, may need to consider switching but will defer to cardiology. His BP is such that titrating Carvedilol is not advisable at this time. He is on Couamdin for a mitral valve repair. His INR today was 1.3. He was instructed by the clinic to increase Coumadin to 5 mg (except for Thursday and Sunday). he will have another INR checked on 01/07. Although, he has no LE edema, I instructed him to continue daily Lasix (with a potassium supplement) for now which should help small pleural effusions and interstitial edema. He is not taking any narcotic for pain but he is unsure if he wants to drive anytime soon. I told him he may return to driving at his leisure. He was instructed to call his  medical doctor for a follow up appointment regarding HGA1C pre op 7 and also to determine if he needs oral medication. He was encouraged to participate in cardiac rehab, which will make a referral for. He will return to see Dr. Dorris FetchHendrickson in 2-3 weeks with a CXR.    Ardelle Ballsonielle M Zimmerman, PA-C [facility] [phone]

## 2018-06-20 NOTE — Patient Instructions (Signed)
Description   Start taking 2 tablets daily except 1.5 tablets on Sundays and Thursdays. Recheck INR in 1 week. Main [phone] Coumadin [phone]

## 2018-06-27 DIAGNOSIS — I5041 Acute combined systolic (congestive) and diastolic (congestive) heart failure: Secondary | ICD-10-CM | POA: Diagnosis not present

## 2018-06-27 DIAGNOSIS — I509 Heart failure, unspecified: Secondary | ICD-10-CM | POA: Diagnosis not present

## 2018-06-28 ENCOUNTER — Ambulatory Visit: Payer: Medicare HMO

## 2018-06-28 ENCOUNTER — Telehealth (HOSPITAL_COMMUNITY): Payer: Self-pay

## 2018-06-28 DIAGNOSIS — Z9889 Other specified postprocedural states: Secondary | ICD-10-CM | POA: Diagnosis not present

## 2018-06-28 DIAGNOSIS — Z5181 Encounter for therapeutic drug level monitoring: Secondary | ICD-10-CM | POA: Diagnosis not present

## 2018-06-28 LAB — POCT INR: INR: 1.3 — AB (ref 2.0–3.0)

## 2018-06-28 NOTE — Telephone Encounter (Signed)
Called pt to see if he would like to proceed with scheduling for CR, pt stated not at this time due to transportation. Will call if anything changes.  Closed referral

## 2018-06-28 NOTE — Patient Instructions (Signed)
Please take 2.5 tablets tonight, then START NEW DOSAGE of 2 tablets every day.  Recheck INR in 1 week.  Pick a day of the week to have your greens and have them every week on that day. Main [phone] Coumadin [phone]

## 2018-07-06 ENCOUNTER — Ambulatory Visit (INDEPENDENT_AMBULATORY_CARE_PROVIDER_SITE_OTHER): Payer: Medicare HMO | Admitting: *Deleted

## 2018-07-06 ENCOUNTER — Encounter: Payer: Self-pay | Admitting: Physician Assistant

## 2018-07-06 ENCOUNTER — Ambulatory Visit: Payer: Medicare HMO | Admitting: Physician Assistant

## 2018-07-06 DIAGNOSIS — Z5181 Encounter for therapeutic drug level monitoring: Secondary | ICD-10-CM | POA: Diagnosis not present

## 2018-07-06 DIAGNOSIS — I255 Ischemic cardiomyopathy: Secondary | ICD-10-CM | POA: Diagnosis not present

## 2018-07-06 DIAGNOSIS — Z951 Presence of aortocoronary bypass graft: Secondary | ICD-10-CM | POA: Diagnosis not present

## 2018-07-06 DIAGNOSIS — I10 Essential (primary) hypertension: Secondary | ICD-10-CM

## 2018-07-06 DIAGNOSIS — E785 Hyperlipidemia, unspecified: Secondary | ICD-10-CM | POA: Diagnosis not present

## 2018-07-06 DIAGNOSIS — I5022 Chronic systolic (congestive) heart failure: Secondary | ICD-10-CM | POA: Diagnosis not present

## 2018-07-06 DIAGNOSIS — Z9889 Other specified postprocedural states: Secondary | ICD-10-CM

## 2018-07-06 LAB — POCT INR: INR: 1.6 — AB (ref 2.0–3.0)

## 2018-07-06 MED ORDER — WARFARIN SODIUM 2.5 MG PO TABS: ORAL_TABLET | ORAL | 1 refills | Status: DC

## 2018-07-06 MED ORDER — FUROSEMIDE 40 MG PO TABS: 20.0000 mg | ORAL_TABLET | Freq: Every day | ORAL | 1 refills | Status: DC

## 2018-07-06 MED ORDER — POTASSIUM CHLORIDE ER 20 MEQ PO TBCR: 10.0000 meq | EXTENDED_RELEASE_TABLET | Freq: Every day | ORAL | 3 refills | Status: DC

## 2018-07-06 NOTE — Patient Instructions (Signed)
Description   Please take 2.5 tablets tonight, then START taking 2 tablets every day except 2.5 tablets on Sundays and Wednesdays. Recheck INR in 1 week. Pick a day of the week to have your greens and have them every week on that day. Main [phone] Coumadin [phone]

## 2018-07-06 NOTE — Patient Instructions (Addendum)
Medication Instructions:  1.) DECREASE: Lasix to half a tablet daily (20 mg)  2.) DECREASE: Potassium to half a tablet daily (10 meq)  If you need a refill on your cardiac medications before your next appointment, please call your pharmacy.   Lab work: TODAY: BMET  Your physician recommends that you return for a FASTING lipid profile and Hepatic function test on 08/03/2018 (Nothing to eat or drink after midnight)  If you have labs (blood work) drawn today and your tests are completely normal, you will receive your results only by: Marland Kitchen. MyChart Message (if you have MyChart) OR . A paper copy in the mail If you have any lab test that is abnormal or we need to change your treatment, we will call you to review the results.  Testing/Procedures: None  Follow-Up: You are scheduled to see Jacolyn ReedyMichele Lenze PA on 08/03/2018 @ 9:00 AM   Any Other Special Instructions Will Be Listed Below (If Applicable).

## 2018-07-06 NOTE — Progress Notes (Signed)
Cardiology Office Note    Date:  07/06/2018   ID:  Michael Banks, DOB 27-Oct-1950, MRN [id number]  PCP:  Lorenda Ishihara, MD  Cardiologist: Tobias Alexander, MD EPS: None  Chief Complaint  Patient presents with  . Hospitalization Follow-up    History of Present Illness:  Michael Banks is a 68 y.o. male with history of hypertension, HLD, tobacco abuse admitted 04/2018 with shortness of breath and orthopnea.  Echo showed severely reduced LV function ejection fraction 20 to 25% with moderate MR.  Cardiac catheterization revealed severe CAD and severe MR.  Mild cardio viability study revealed viability in 12 out of 17 segments.  He underwent CABG x5 and MV repair 05/13/2018.  Briefly had atrial fibrillation postop converted to normal sinus rhythm with amiodarone.  He is to be maintained on Coumadin and aspirin per CVTS.  No ace inhibitor or ARB or Spironolactone given because of elevated creatinine in the hospital.  Last creatinine we have in the system is 0.88 on 05/19/2018.  Will need lifelong SBE prophylaxis.  Patient missed his initial post hospital follow-up here.  He is now on lisinopril 5 mg once daily. He complains of weight loss-at least 25 lbs, no appetite and loss of his taste buds. Also constipation. Walks 10 min twice a day. Not interested in cardiac rehab-no transportation.   Past Medical History:  Diagnosis Date  . Dilated cardiomyopathy [facility])     Past Surgical History:  Procedure Laterality Date  . CORONARY ARTERY BYPASS GRAFT N/A 05/13/2018   Procedure: CORONARY ARTERY BYPASS GRAFTING (CABG) times _five__ using left internal mammary artery and right  greater saphenous vein harvested endoscopically.;  Surgeon: Loreli Slot, MD;  Location: [facility] OR;  Service: Open Heart Surgery;  Laterality: N/A;  . KNEE ARTHROSCOPY    . MITRAL VALVE REPAIR N/A 05/13/2018   Procedure: Mitral Valve Repair;  Surgeon: Loreli Slot, MD;  Location: [facility] OR;  Service: Open  Heart Surgery;  Laterality: N/A;  . NOSE SURGERY    . RIGHT/LEFT HEART CATH AND CORONARY ANGIOGRAPHY N/A 05/05/2018   Procedure: RIGHT/LEFT HEART CATH AND CORONARY ANGIOGRAPHY;  Surgeon: Marykay Lex, MD;  Location: [facility] INVASIVE CV LAB;  Service: Cardiovascular;  Laterality: N/A;  . TEE WITHOUT CARDIOVERSION N/A 05/13/2018   Procedure: TRANSESOPHAGEAL ECHOCARDIOGRAM (TEE);  Surgeon: Loreli Slot, MD;  Location: [facility] OR;  Service: Open Heart Surgery;  Laterality: N/A;  . ULTRASOUND GUIDANCE FOR VASCULAR ACCESS  05/05/2018   Procedure: Ultrasound Guidance For Vascular Access;  Surgeon: Marykay Lex, MD;  Location: [facility] INVASIVE CV LAB;  Service: Cardiovascular;;    Current Medications: Current Meds  Medication Sig  . aspirin EC 81 MG EC tablet Take 1 tablet (81 mg total) by mouth daily.  Marland Kitchen atorvastatin (LIPITOR) 80 MG tablet Take 1 tablet (80 mg total) by mouth daily at 6 PM.  . carvedilol (COREG) 3.125 MG tablet Take 1 tablet (3.125 mg total) by mouth 2 (two) times daily with a meal.  . furosemide (LASIX) 40 MG tablet Take 1 tablet (40 mg total) by mouth daily.  Marland Kitchen lisinopril (PRINIVIL,ZESTRIL) 5 MG tablet Take 1 tablet (5 mg total) by mouth daily.  . potassium chloride 20 MEQ TBCR Take 10 mEq by mouth daily.  Marland Kitchen warfarin (COUMADIN) 2.5 MG tablet Take as directed by coumadin clinic     Allergies:   Patient has no known allergies.   Social History   Socioeconomic History  . Marital status: Single  Spouse name: Not on file  . Number of children: Not on file  . Years of education: Not on file  . Highest education level: Not on file  Occupational History  . Not on file  Social Needs  . Financial resource strain: Not on file  . Food insecurity:    Worry: Not on file    Inability: Not on file  . Transportation needs:    Medical: Not on file    Non-medical: Not on file  Tobacco Use  . Smoking status: Former Games developermoker  . Smokeless tobacco: Never Used  Substance and Sexual  Activity  . Alcohol use: Never    Frequency: Never  . Drug use: Not on file  . Sexual activity: Not on file  Lifestyle  . Physical activity:    Days per week: Not on file    Minutes per session: Not on file  . Stress: Not on file  Relationships  . Social connections:    Talks on phone: Not on file    Gets together: Not on file    Attends religious service: Not on file    Active member of club or organization: Not on file    Attends meetings of clubs or organizations: Not on file    Relationship status: Not on file  Other Topics Concern  . Not on file  Social History Narrative  . Not on file     Family History:  The patient's   family history includes CAD in his brother; CVA in his father.   ROS:   Please see the history of present illness.    Review of Systems  Constitution: Positive for weight loss.  HENT: Negative.   Cardiovascular: Negative.   Respiratory: Negative.   Endocrine: Negative.   Hematologic/Lymphatic: Negative.   Musculoskeletal: Negative.   Gastrointestinal: Positive for constipation and flatus.  Genitourinary: Negative.   Neurological: Negative.   Psychiatric/Behavioral: Positive for depression. The patient is nervous/anxious.    All other systems reviewed and are negative.   PHYSICAL EXAM:   VS:  BP (!) 80/62   Pulse 90   Ht 5\' 9"  (1.753 m)   Wt 159 lb (72.1 kg)   BMI 23.48 kg/m   Physical Exam  GEN: Thin, in no acute distress  Neck: no JVD, carotid bruits, or masses Cardiac:RRR; no murmurs, rubs, or gallops  Respiratory: Decreased breath sounds but clear to auscultation bilaterally, normal work of breathing GI: soft, nontender, nondistended, + BS Ext: without cyanosis, clubbing, or edema, Good distal pulses bilaterally Neuro:  Alert and Oriented x 3 Psych: euthymic mood, full affect  Wt Readings from Last 3 Encounters:  07/06/18 159 lb (72.1 kg)  06/20/18 163 lb 4.8 oz (74.1 kg)  05/19/18 184 lb 11.9 oz (83.8 kg)      Studies/Labs  Reviewed:   EKG:  EKG is ordered today.  The ekg ordered today demonstrates normal sinus rhythm at 90 bpm inferior Q waves, no acute change  Recent Labs: 05/03/2018: B Natriuretic Peptide 2,703.3 05/12/2018: ALT 30 05/14/2018: Magnesium 2.3 05/19/2018: BUN 18; Creatinine, Ser 0.88; Hemoglobin 10.6; Platelets 309; Potassium 4.3; Sodium 136   Lipid Panel    Component Value Date/Time   CHOL 129 05/04/2018 0433   TRIG 57 05/04/2018 0433   HDL 31 (L) 05/04/2018 0433   CHOLHDL 4.2 05/04/2018 0433   VLDL 11 05/04/2018 0433   LDLCALC 87 05/04/2018 0433    Additional studies/ records that were reviewed today include:  Cardiac cath 05/05/2018  ISCHEMIC CARDIOMYOPATHY  Hemodynamic findings consistent with moderate pulmonary hypertension -secondary to pulmonary venous hypertension  LV end diastolic pressure and pulmonary capillary wedge pressure are moderately elevated.  There is severe (4+) mitral regurgitation.  SEVERE MULTIVESSEL DISEASE  Prox RCA to Mid RCA lesion is 90% stenosed. Mid RCA to Dist RCA lesion is 100% stenosed with 100% stenosed side branch in Post Atrio.  Prox LAD lesion is 99% stenosed. Mid LAD-1 lesion is 80% stenosed. Mid LAD-2 lesion is 90% stenosed. Dist LAD lesion is 85% stenosed.  Prox Cx lesion is 95% stenosed. 2nd Mrg lesion is 90% stenosed with 90% stenosed side branch in Lat 2nd Mrg.   SUMMARY  Severe multivessel CAD: Diffuse proximal RCA disease with mid 100% CTO filling with bridging collaterals and right-to-left collaterals, separate ostium circumflex with proximal 95% stenosis and bifurcation 90% stenosis of OM1, 99% mid LAD after very large septal perforator trunk (perfusing PDA) with bridging collaterals filling mid to distal LAD with a large diagonal branch and diffuse disease throughout the distal LAD.  Moderate pulmonary hypertension likely secondary to pulmonary venous congestion and mitral regurgitation.  At least moderate-severe if not  severe mitral regurgitation (likely ischemic)  Moderately elevated LVEDP and PCWP consistent with acute diastolic heart failure in conjunction with acute systolic heart failure from Ischemic Cardiomyopathy   At least mildly reduced cardiac output of 4.8, index 2.39.     RECOMMENDATION  Return to nursing unit for TR band removal.  Continue aggressive CHF management  The patient would likely best benefit from CABG plus possible mitral valve repair. --We will consult CVTS  Will place on IV heparin 8 hours after sheath removal  Consider viability study as part of assessment for revascularization options.  Percutaneous options would be limited and extremely high risk given his low output and multivessel disease.  Would likely need support with Impella  Recommend Aspirin 81mg  daily for severe CAD.    Echo 05/04/2018 Study Conclusions   - Left ventricle: The cavity size was moderately dilated. Systolic   function was severely reduced. The estimated ejection fraction   was in the range of 20% to 25%. Indeterminate diastolic function.   Severe diffuse hypokinesis. - Regional wall motion abnormality: Akinesis of the mid   inferolateral, basal-mid anterolateral, and apical lateral   myocardium. - Aortic valve: Transvalvular velocity was within the normal range.   There was no stenosis. There was no regurgitation. - Mitral valve: Mitral valve leaflet tenting. There was moderate   regurgitation. The PISA quantitation may slightly underestimate   severity, with one jet more central, and one jet eccentric and   anteriorly directed (clip 71). - Left atrium: The atrium was mildly dilated. - Right ventricle: The cavity size was mildly dilated. Wall   thickness was normal. Systolic function was moderately reduced. - Tricuspid valve: There was trivial regurgitation. - Inferior vena cava: The vessel was dilated. The respirophasic   diameter changes were blunted (< 50%), consistent with  elevated   central venous pressure. - Pericardium, extracardiac: Pleural effusion.       ASSESSMENT:    1. S/P CABG x 5   2. S/P mitral valve repair   3. Ischemic cardiomyopathy   4. Chronic systolic heart failure (HCC)   5. Hyperlipidemia, unspecified hyperlipidemia type   6. Essential hypertension      PLAN:  In order of problems listed above:  Status post CABG x5 and mitral valve repair 04/2018 being maintained on Coumadin and aspirin as well as  Lipitor  Ischemic cardiomyopathy ejection fraction 20 to 25% now on low-dose lisinopril.  We will follow-up echo in a couple months.  Chronic systolic CHF compensated.  Initial chest x-ray 06/21/2018 showed some interstitial edema and he did have 2 weeks of swelling postop but that is all resolved.  Blood pressure is quite low at 80/62.  No dizziness.  Will reduce Lasix to 20 mg once daily and potassium 10 mEq once daily.  2 g sodium diet.  Can take extra Lasix and potassium for weight gain of 2 or 3 pounds overnight or edema.  Follow-up with myself in a couple weeks.  Hyperlipidemia on Lipitor for fasting lipid panel and LFTs in 1 to 2 months  Essential hypertension blood pressure quite low.  On low-dose lisinopril and carvedilol.  I reduced his Lasix.    Medication Adjustments/Labs and Tests Ordered: Current medicines are reviewed at length with the patient today.  Concerns regarding medicines are outlined above.  Medication changes, Labs and Tests ordered today are listed in the Patient Instructions below. There are no Patient Instructions on file for this visit.   Elson Clan, PA-C  07/06/2018 12:36 PM    [facility] [address] [place], [place], [place]  [address] Phone: [phone]; Fax: [phone]

## 2018-07-07 ENCOUNTER — Other Ambulatory Visit: Payer: Self-pay | Admitting: Cardiology

## 2018-07-07 ENCOUNTER — Other Ambulatory Visit: Payer: Self-pay | Admitting: Surgical

## 2018-07-07 LAB — BASIC METABOLIC PANEL
BUN/Creatinine Ratio: 13 (ref 10–24)
BUN: 10 mg/dL (ref 8–27)
CO2: 24 mmol/L (ref 20–29)
Calcium: 9.4 mg/dL (ref 8.6–10.2)
Chloride: 101 mmol/L (ref 96–106)
Creatinine, Ser: 0.8 mg/dL (ref 0.76–1.27)
GFR calc Af Amer: 108 mL/min/{1.73_m2} (ref >59)
GFR, EST NON AFRICAN AMERICAN: 94 mL/min/{1.73_m2} (ref >59)
Glucose: 131 mg/dL — ABNORMAL HIGH (ref 65–99)
Potassium: 4.8 mmol/L (ref 3.5–5.2)
SODIUM: 140 mmol/L (ref 134–144)

## 2018-07-11 ENCOUNTER — Other Ambulatory Visit: Payer: Self-pay | Admitting: Thoracic Surgery (Cardiothoracic Vascular Surgery)

## 2018-07-11 DIAGNOSIS — Z951 Presence of aortocoronary bypass graft: Secondary | ICD-10-CM

## 2018-07-12 ENCOUNTER — Encounter: Payer: Self-pay | Admitting: Thoracic Surgery (Cardiothoracic Vascular Surgery)

## 2018-07-12 ENCOUNTER — Ambulatory Visit
Admission: RE | Admit: 2018-07-12 | Discharge: 2018-07-12 | Disposition: A | Discharge status: Home / Self Care | Payer: Medicare HMO | Source: Ambulatory Visit | Attending: Thoracic Surgery (Cardiothoracic Vascular Surgery) | Admitting: Thoracic Surgery (Cardiothoracic Vascular Surgery)

## 2018-07-12 ENCOUNTER — Ambulatory Visit (INDEPENDENT_AMBULATORY_CARE_PROVIDER_SITE_OTHER): Payer: Self-pay | Admitting: Thoracic Surgery (Cardiothoracic Vascular Surgery)

## 2018-07-12 ENCOUNTER — Other Ambulatory Visit: Payer: Self-pay

## 2018-07-12 VITALS — BP 119/78 | HR 86 | Resp 16 | Ht 69.0 in | Wt 159.0 lb

## 2018-07-12 DIAGNOSIS — Z9889 Other specified postprocedural states: Secondary | ICD-10-CM

## 2018-07-12 DIAGNOSIS — I25119 Atherosclerotic heart disease of native coronary artery with unspecified angina pectoris: Secondary | ICD-10-CM

## 2018-07-12 DIAGNOSIS — J90 Pleural effusion, not elsewhere classified: Secondary | ICD-10-CM | POA: Diagnosis not present

## 2018-07-12 DIAGNOSIS — Z951 Presence of aortocoronary bypass graft: Secondary | ICD-10-CM

## 2018-07-12 DIAGNOSIS — I340 Nonrheumatic mitral (valve) insufficiency: Secondary | ICD-10-CM

## 2018-07-12 NOTE — Progress Notes (Signed)
[address]       Michael Banks [address]             [phone]        HPI: Michael Banks returns for scheduled postoperative follow-up visit  Michael Banks is a 68 year old man with a past history of hypertension, hyperlipidemia, type 2 diabetes, tobacco abuse, dilated cardiomyopathy, and mitral insufficiency.  He presented with class IV congestive heart failure back in November.  He was found to have severe multivessel coronary disease and 4+ mitral regurgitation.  His ejection fraction was 20%.  He underwent coronary artery bypass grafting x5 and mitral valve repair on 05/13/2018.  His postoperative course was notable for transient atrial fibrillation that resolved with amiodarone.  He went home on day 5.  Since discharge his primary complaints have been food not having any taste leading to weight loss.  He is also had to have his Lasix dose adjusted multiple times to maintain his volume status.  He is not having any incisional pain and he is not taking any narcotics.  He is no longer using home oxygen.  Current Outpatient Medications  Medication Sig Dispense Refill  . aspirin EC 81 MG EC tablet Take 1 tablet (81 mg total) by mouth daily.    Marland Kitchen atorvastatin (LIPITOR) 80 MG tablet TAKE 1 TABLET BY MOUTH EVERY DAY AT 6 PM 30 tablet 1  . carvedilol (COREG) 3.125 MG tablet TAKE 1 TABLET BY MOUTH TWICE A DAY WITH MEAL 60 tablet 1  . furosemide (LASIX) 40 MG tablet TAKE 1 TABLET BY MOUTH EVERY DAY (Patient taking differently: 20 mg. ) 30 tablet 1  . lisinopril (PRINIVIL,ZESTRIL) 5 MG tablet Take 1 tablet (5 mg total) by mouth daily. 30 tablet 1  . Potassium Chloride ER 20 MEQ TBCR Take 10 mEq by mouth daily. 45 tablet 3  . warfarin (COUMADIN) 2.5 MG tablet Take as directed by coumadin clinic 60 tablet 1   No current facility-administered medications for this visit.     Physical Exam BP 119/78 (BP Location: Right Arm, Patient Position: Sitting, Cuff Size: Large)   Pulse  86   Resp 16   Ht 5\' 9"  (1.753 m)   Wt 159 lb (72.1 kg)   BMI 23.66 kg/m  67 year old man in no acute distress Alert and oriented x3 with no focal neurologic deficits Cardiac regular rate and rhythm with no rub or murmur Lungs clear bilaterally, no rales or wheezing Sternum stable, incision well-healed No peripheral edema  Diagnostic Tests: CHEST - 2 VIEW  COMPARISON:  06/20/2018  FINDINGS: Lungs are clear. Trace bilateral pleural effusions. No pneumothorax.  The heart is normal in size.  Prosthetic valve.  Insert cabbage  Median sternotomy.  Mild degenerative changes of the thoracic spine.  IMPRESSION: Trace bilateral pleural effusions.   Electronically Signed   By: Michael Banks M.D.   On: 07/12/2018 11:35 I personally reviewed the chest x-ray images and concur with the findings noted above.  Impression: Michael Banks is a 68 year old gentleman with a past history of hypertension, hyperlipidemia, type 2 diabetes, tobacco abuse, dilated cardiomyopathy, and mitral insufficiency, who presented with class IV congestive heart failure back in November.  He was found to have severe three-vessel disease and 4+ mitral regurgitation.  He underwent coronary artery bypass grafting x5 and mitral valve repair on 05/13/2018.  From a cardiac standpoint he is doing well.  He is in sinus rhythm.  His heart failure symptoms are currently  well controlled.  He will need a follow-up echocardiogram in a couple of months to check on his left ventricular function.  His primary complaint is that his "taste buds are messed up."  He complains of food not having any taste and having a poor appetite.  He is no longer on amiodarone.  I suspect that is just related to anesthesia and will resolve.  I did encourage him to take protein supplements to help maintain his muscle mass while waiting for that to return to normal.  He is not having any significant pain.  His activities are unrestricted  from my standpoint.  Plan:  Follow-up as scheduled with cardiology I will be happy to see Michael Banks back anytime in the future if I can be of any further assistance with his care.  Michael Slot, MD [facility] [phone]

## 2018-07-14 ENCOUNTER — Ambulatory Visit: Payer: Medicare HMO | Admitting: *Deleted

## 2018-07-14 ENCOUNTER — Encounter: Payer: Self-pay | Admitting: Physician Assistant

## 2018-07-14 DIAGNOSIS — Z5181 Encounter for therapeutic drug level monitoring: Secondary | ICD-10-CM | POA: Diagnosis not present

## 2018-07-14 DIAGNOSIS — Z9889 Other specified postprocedural states: Secondary | ICD-10-CM

## 2018-07-14 LAB — POCT INR: INR: 1.7 — AB (ref 2.0–3.0)

## 2018-07-14 NOTE — Patient Instructions (Addendum)
Description   Please take 2.5 tablets tonight, then START taking 2.5 tablets every day except 2 tablets on Tuesdays, Thursdays, and Saturdays.  Continue drinking your Ensure. Main [phone] Coumadin [phone]

## 2018-07-15 ENCOUNTER — Telehealth: Payer: Self-pay | Admitting: Cardiology

## 2018-07-15 ENCOUNTER — Other Ambulatory Visit: Payer: Self-pay | Admitting: Surgical

## 2018-07-15 ENCOUNTER — Other Ambulatory Visit: Payer: Self-pay | Admitting: *Deleted

## 2018-07-15 MED ORDER — LISINOPRIL 5 MG PO TABS: 5.0000 mg | ORAL_TABLET | Freq: Every day | ORAL | 5 refills | Status: DC

## 2018-07-15 NOTE — Telephone Encounter (Signed)
New message      *STAT* If patient is at the pharmacy, call can be transferred to refill team.   1. Which medications need to be refilled? (please list name of each medication and dose if known) lisinopril (PRINIVIL,ZESTRIL) 5 MG tablet  2. Which pharmacy/location (including street and city if local pharmacy) is medication to be sent to?[id number] - [place], [place] - [address]  3. Do they need a 30 day or 90 day supply? 30  [facility] stated that [facility] approval

## 2018-07-20 ENCOUNTER — Ambulatory Visit: Payer: Medicare HMO | Admitting: Pharmacist

## 2018-07-20 DIAGNOSIS — Z5181 Encounter for therapeutic drug level monitoring: Secondary | ICD-10-CM

## 2018-07-20 DIAGNOSIS — Z9889 Other specified postprocedural states: Secondary | ICD-10-CM | POA: Diagnosis not present

## 2018-07-20 LAB — POCT INR: INR: 2 (ref 2.0–3.0)

## 2018-07-20 NOTE — Patient Instructions (Addendum)
Continue 2.5 tablets every day except 2 tablets on Tuesdays, Thursdays, and Saturdays.  Continue drinking your Ensure or taking multivitamin, not both. Recheck in 2 weeks. Main [phone] Coumadin [phone]

## 2018-08-02 NOTE — Progress Notes (Signed)
Cardiology Office Note    Date:  08/03/2018   ID:  Michael Banks, DOB July 04, 1950, MRN [id number]  PCP:  Lorenda IshiharaVaradarajan, Rupashree, MD  Cardiologist: Tobias AlexanderKatarina Nelson, MD EPS: None  No chief complaint on file.   History of Present Illness:  Michael Banks is a 68 y.o. male  with history of hypertension, HLD, tobacco abuse admitted 04/2018 with shortness of breath and orthopnea.  Echo showed severely reduced LV function ejection fraction 20 to 25% with moderate MR.  Cardiac catheterization revealed severe CAD and severe MR.  Mild cardio viability study revealed viability in 12 out of 17 segments.  He underwent CABG x5 and MV repair 05/13/2018.  Briefly had atrial fibrillation postop converted to normal sinus rhythm with amiodarone.  He is to be maintained on Coumadin and aspirin per CVTS.  No ace inhibitor or ARB or Spironolactone given because of elevated creatinine in the hospital.  Last creatinine we have in the system is 0.88 on 05/19/2018.  Will need lifelong SBE prophylaxis.   Patient missed his initial postop follow-up.  I saw him 07/06/2018 at which time he had 25 pound weight loss and no appetite and lost his taste buds.  Blood pressure was low at 80/62.  I decrease his Lasix to 20 mg once daily and potassium 10 mEq daily.  Patient comes in today for follow-up.  He has had no further fluid buildup on decrease Lasix.  He lost another 2 pounds and still complains of his taste buds.  He is asking how long he has to be on the Coumadin.  Past Medical History:  Diagnosis Date  . Dilated cardiomyopathy [facility](HCC)     Past Surgical History:  Procedure Laterality Date  . CORONARY ARTERY BYPASS GRAFT N/A 05/13/2018   Procedure: CORONARY ARTERY BYPASS GRAFTING (CABG) times _five__ using left internal mammary artery and right  greater saphenous vein harvested endoscopically.;  Surgeon: Loreli SlotHendrickson, Steven C, MD;  [facility];  Service: Open Heart Surgery;  Laterality: N/A;  . KNEE ARTHROSCOPY     . MITRAL VALVE REPAIR N/A 05/13/2018   Procedure: Mitral Valve Repair;  Surgeon: Loreli SlotHendrickson, Steven C, MD;  [facility];  Service: Open Heart Surgery;  Laterality: N/A;  . NOSE SURGERY    . RIGHT/LEFT HEART CATH AND CORONARY ANGIOGRAPHY N/A 05/05/2018   Procedure: RIGHT/LEFT HEART CATH AND CORONARY ANGIOGRAPHY;  Surgeon: Marykay LexHarding, David W, MD;  [facility];  Service: Cardiovascular;  Laterality: N/A;  . TEE WITHOUT CARDIOVERSION N/A 05/13/2018   Procedure: TRANSESOPHAGEAL ECHOCARDIOGRAM (TEE);  Surgeon: Loreli SlotHendrickson, Steven C, MD;  [facility];  Service: Open Heart Surgery;  Laterality: N/A;  . ULTRASOUND GUIDANCE FOR VASCULAR ACCESS  05/05/2018   Procedure: Ultrasound Guidance For Vascular Access;  Surgeon: Marykay LexHarding, David W, MD;  [facility];  Service: Cardiovascular;;    Current Medications: Current Meds  Medication Sig  . aspirin EC 81 MG EC tablet Take 1 tablet (81 mg total) by mouth daily.  Marland Kitchen. atorvastatin (LIPITOR) 80 MG tablet TAKE 1 TABLET BY MOUTH EVERY DAY AT 6 PM  . carvedilol (COREG) 3.125 MG tablet TAKE 1 TABLET BY MOUTH TWICE A DAY WITH MEAL  . lisinopril (PRINIVIL,ZESTRIL) 5 MG tablet Take 1 tablet (5 mg total) by mouth daily.  . Potassium Chloride ER 20 MEQ TBCR Take 10 mEq by mouth daily.  Marland Kitchen. warfarin (COUMADIN) 2.5 MG tablet Take as directed by coumadin clinic  . [DISCONTINUED] furosemide (LASIX) 20 MG tablet  Take 20 mg by mouth.     Allergies:   Patient has no known allergies.   Social History   Socioeconomic History  . Marital status: Single    Spouse name: Not on file  . Number of children: Not on file  . Years of education: Not on file  . Highest education level: Not on file  Occupational History  . Not on file  Social Needs  . Financial resource strain: Not on file  . Food insecurity:    Worry: Not on file    Inability: Not on file  . Transportation needs:    Medical: Not on file    Non-medical: Not on file    Tobacco Use  . Smoking status: Former Games developer  . Smokeless tobacco: Never Used  Substance and Sexual Activity  . Alcohol use: Never    Frequency: Never  . Drug use: Never  . Sexual activity: Not on file  Lifestyle  . Physical activity:    Days per week: Not on file    Minutes per session: Not on file  . Stress: Not on file  Relationships  . Social connections:    Talks on phone: Not on file    Gets together: Not on file    Attends religious service: Not on file    Active member of club or organization: Not on file    Attends meetings of clubs or organizations: Not on file    Relationship status: Not on file  Other Topics Concern  . Not on file  Social History Narrative  . Not on file     Family History:  The patient's family history includes CAD in his brother; CVA in his father.   ROS:   Please see the history of present illness.    Review of Systems  Constitution: Positive for weight loss.  HENT: Negative.   Cardiovascular: Negative.   Respiratory: Negative.   Endocrine: Negative.   Hematologic/Lymphatic: Negative.   Musculoskeletal: Negative.   Gastrointestinal: Negative.   Genitourinary: Negative.   Neurological: Negative.    All other systems reviewed and are negative.   PHYSICAL EXAM:   VS:  BP 100/70 (BP Location: Left Arm, Patient Position: Sitting, Cuff Size: Normal)   Pulse 82   Ht 5\' 9"  (1.753 m)   Wt 157 lb 6.4 oz (71.4 kg)   SpO2 97% Comment: at rest  BMI 23.24 kg/m   Physical Exam  GEN: Thin, in no acute distress  Neck: no JVD, carotid bruits, or masses Cardiac:RRR; no murmurs, rubs, or gallops  Respiratory: Decreased breath sounds but clear to auscultation bilaterally, normal work of breathing GI: soft, nontender, nondistended, + BS Ext: without cyanosis, clubbing, or edema, Good distal pulses bilaterally Neuro:  Alert and Oriented x 3 Psych: euthymic mood, full affect  Wt Readings from Last 3 Encounters:  08/03/18 157 lb 6.4 oz (71.4  kg)  07/12/18 159 lb (72.1 kg)  07/06/18 159 lb (72.1 kg)      Studies/Labs Reviewed:   EKG:  EKG is not ordered today.  Recent Labs: 05/03/2018: B Natriuretic Peptide 2,703.3 05/12/2018: ALT 30 05/14/2018: Magnesium 2.3 05/19/2018: Hemoglobin 10.6; Platelets 309 07/06/2018: BUN 10; Creatinine, Ser 0.80; Potassium 4.8; Sodium 140   Lipid Panel    Component Value Date/Time   CHOL 129 05/04/2018 0433   TRIG 57 05/04/2018 0433   HDL 31 (L) 05/04/2018 0433   CHOLHDL 4.2 05/04/2018 0433   VLDL 11 05/04/2018 0433   LDLCALC 87 05/04/2018 0433  Additional studies/ records that were reviewed today include:   Cardiac cath 05/05/2018  ISCHEMIC CARDIOMYOPATHY  Hemodynamic findings consistent with moderate pulmonary hypertension -secondary to pulmonary venous hypertension  LV end diastolic pressure and pulmonary capillary wedge pressure are moderately elevated.  There is severe (4+) mitral regurgitation.  SEVERE MULTIVESSEL DISEASE  Prox RCA to Mid RCA lesion is 90% stenosed. Mid RCA to Dist RCA lesion is 100% stenosed with 100% stenosed side branch in Post Atrio.  Prox LAD lesion is 99% stenosed. Mid LAD-1 lesion is 80% stenosed. Mid LAD-2 lesion is 90% stenosed. Dist LAD lesion is 85% stenosed.  Prox Cx lesion is 95% stenosed. 2nd Mrg lesion is 90% stenosed with 90% stenosed side branch in Lat 2nd Mrg.   SUMMARY  Severe multivessel CAD: Diffuse proximal RCA disease with mid 100% CTO filling with bridging collaterals and right-to-left collaterals, separate ostium circumflex with proximal 95% stenosis and bifurcation 90% stenosis of OM1, 99% mid LAD after very large septal perforator trunk (perfusing PDA) with bridging collaterals filling mid to distal LAD with a large diagonal branch and diffuse disease throughout the distal LAD.  Moderate pulmonary hypertension likely secondary to pulmonary venous congestion and mitral regurgitation.  At least moderate-severe if not severe  mitral regurgitation (likely ischemic)  Moderately elevated LVEDP and PCWP consistent with acute diastolic heart failure in conjunction with acute systolic heart failure from Ischemic Cardiomyopathy   At least mildly reduced cardiac output of 4.8, index 2.39.     RECOMMENDATION  Return to nursing unit for TR band removal.  Continue aggressive CHF management  The patient would likely best benefit from CABG plus possible mitral valve repair. --We will consult CVTS  Will place on IV heparin 8 hours after sheath removal  Consider viability study as part of assessment for revascularization options.  Percutaneous options would be limited and extremely high risk given his low output and multivessel disease.  Would likely need support with Impella  Recommend Aspirin 81mg  daily for severe CAD.    Echo 05/04/2018 Study Conclusions   - Left ventricle: The cavity size was moderately dilated. Systolic   function was severely reduced. The estimated ejection fraction   was in the range of 20% to 25%. Indeterminate diastolic function.   Severe diffuse hypokinesis. - Regional wall motion abnormality: Akinesis of the mid   inferolateral, basal-mid anterolateral, and apical lateral   myocardium. - Aortic valve: Transvalvular velocity was within the normal range.   There was no stenosis. There was no regurgitation. - Mitral valve: Mitral valve leaflet tenting. There was moderate   regurgitation. The PISA quantitation may slightly underestimate   severity, with one jet more central, and one jet eccentric and   anteriorly directed (clip 71). - Left atrium: The atrium was mildly dilated. - Right ventricle: The cavity size was mildly dilated. Wall   thickness was normal. Systolic function was moderately reduced. - Tricuspid valve: There was trivial regurgitation. - Inferior vena cava: The vessel was dilated. The respirophasic   diameter changes were blunted (< 50%), consistent with elevated    central venous pressure. - Pericardium, extracardiac: Pleural effusion.            ASSESSMENT:    1. 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 Ischemic cardiomyopathy   3. Chronic systolic heart failure (HCC)   4. Essential hypertension   5. Hyperlipidemia, unspecified hyperlipidemia type      PLAN:  In order of problems listed above:   CAD  status post CABG x5 and mitral valve repair 04/2018 on Lipitor, Coumadin and aspirin.  Asked patient to call Dr. Dorris Fetch to see how long he needs to be on Coumadin.  He did have postop atrial fibrillation but has maintained normal sinus rhythm.  Ischemic cardiomyopathy ejection fraction 20 to 25% on low-dose lisinopril.  We will schedule follow-up echo and follow-up with me in 1 month.  Follow-up with Dr. Delton See next available.  Chronic systolic CHF I reduced his Lasix last office visit because of hypotension.  No further swelling.  Will stop Lasix completely.  Essential hypertension on low-dose lisinopril and carvedilol  Hyperlipidemia on Lipitor lipid panel drawn today.  Medication Adjustments/Labs and Tests Ordered: Current medicines are reviewed at length with the patient today.  Concerns regarding medicines are outlined above.  Medication changes, Labs and Tests ordered today are listed in the Patient Instructions below. Patient Instructions  Medication Instructions:  Your physician has recommended you make the following change in your medication:   STOP: lasix  Lab work: None Ordered  If you have labs (blood work) drawn today and your tests are completely normal, you will receive your results only by: Marland Kitchen MyChart Message (if you have MyChart) OR . A paper copy in the mail If you have any lab test that is abnormal or we need to change your treatment, we will call you to review the results.  Testing/Procedures: Your physician has requested that you have an echocardiogram on 08/09/18 at 3:00 PM. Please arrive  at 2:30 PM.. Echocardiography is a painless test that uses sound waves to create images of your heart. It provides your doctor with information about the size and shape of your heart and how well your heart's chambers and valves are working. This procedure takes approximately one hour. There are no restrictions for this procedure.   Follow-Up: . Your physician recommends that you schedule a follow-up appointment with Jacolyn Reedy, PA on 09/06/18 at 12:30 PM  Your physician recommends that you schedule a follow-up appointment with Dr. Delton See on 12/14/18 at 2:20 PM.  .   Any Other Special Instructions Will Be Listed Below (If Applicable).   Call Dr. Sunday Corn office regarding your coumadin    Signed, Jacolyn Reedy, PA-C  08/03/2018 10:00 AM    [facility] [address] [place], [place], [place]  [address] Phone: [phone]; Fax: [phone]

## 2018-08-03 ENCOUNTER — Ambulatory Visit (INDEPENDENT_AMBULATORY_CARE_PROVIDER_SITE_OTHER): Payer: Medicare HMO | Admitting: Pharmacist

## 2018-08-03 ENCOUNTER — Encounter: Payer: Self-pay | Admitting: Physician Assistant

## 2018-08-03 ENCOUNTER — Ambulatory Visit: Payer: Medicare HMO | Admitting: Physician Assistant

## 2018-08-03 ENCOUNTER — Other Ambulatory Visit: Payer: Medicare HMO | Admitting: *Deleted

## 2018-08-03 VITALS — BP 100/70 | HR 82 | Ht 69.0 in | Wt 157.4 lb

## 2018-08-03 DIAGNOSIS — I5022 Chronic systolic (congestive) heart failure: Secondary | ICD-10-CM

## 2018-08-03 DIAGNOSIS — Z5181 Encounter for therapeutic drug level monitoring: Secondary | ICD-10-CM | POA: Diagnosis not present

## 2018-08-03 DIAGNOSIS — I255 Ischemic cardiomyopathy: Secondary | ICD-10-CM | POA: Diagnosis not present

## 2018-08-03 DIAGNOSIS — Z9889 Other specified postprocedural states: Secondary | ICD-10-CM

## 2018-08-03 DIAGNOSIS — E785 Hyperlipidemia, unspecified: Secondary | ICD-10-CM

## 2018-08-03 DIAGNOSIS — I10 Essential (primary) hypertension: Secondary | ICD-10-CM

## 2018-08-03 DIAGNOSIS — I25810 Atherosclerosis of coronary artery bypass graft(s) without angina pectoris: Secondary | ICD-10-CM | POA: Diagnosis not present

## 2018-08-03 LAB — HEPATIC FUNCTION PANEL
ALT: 17 IU/L (ref 0–44)
AST: 20 IU/L (ref 0–40)
Albumin: 4.2 g/dL (ref 3.8–4.8)
Alkaline Phosphatase: 72 IU/L (ref 39–117)
Bilirubin Total: 0.8 mg/dL (ref 0.0–1.2)
Bilirubin, Direct: 0.26 mg/dL (ref 0.00–0.40)
Total Protein: 7 g/dL (ref 6.0–8.5)

## 2018-08-03 LAB — LIPID PANEL
Chol/HDL Ratio: 2.7 ratio (ref 0.0–5.0)
Cholesterol, Total: 122 mg/dL (ref 100–199)
HDL: 46 mg/dL (ref >39)
LDL Calculated: 61 mg/dL (ref 0–99)
Triglycerides: 76 mg/dL (ref 0–149)
VLDL Cholesterol Cal: 15 mg/dL (ref 5–40)

## 2018-08-03 LAB — POCT INR: INR: 1.8 — AB (ref 2.0–3.0)

## 2018-08-03 NOTE — Patient Instructions (Addendum)
Medication Instructions:  Your physician has recommended you make the following change in your medication:   STOP: lasix  Lab work: None Ordered  If you have labs (blood work) drawn today and your tests are completely normal, you will receive your results only by: Marland Kitchen MyChart Message (if you have MyChart) OR . A paper copy in the mail If you have any lab test that is abnormal or we need to change your treatment, we will call you to review the results.  Testing/Procedures: Your physician has requested that you have an echocardiogram on 08/09/18 at 3:00 PM. Please arrive at 2:30 PM.. Echocardiography is a painless test that uses sound waves to create images of your heart. It provides your doctor with information about the size and shape of your heart and how well your heart's chambers and valves are working. This procedure takes approximately one hour. There are no restrictions for this procedure.   Follow-Up: . Your physician recommends that you schedule a follow-up appointment with Jacolyn Reedy, PA on 09/06/18 at 12:30 PM  Your physician recommends that you schedule a follow-up appointment with Dr. Delton See on 12/14/18 at 2:20 PM.  .   Any Other Special Instructions Will Be Listed Below (If Applicable).   Call Dr. Sunday Corn office regarding your coumadin

## 2018-08-05 ENCOUNTER — Telehealth: Payer: Self-pay | Admitting: Physician Assistant

## 2018-08-05 NOTE — Telephone Encounter (Signed)
Pt c/o medication issue:  1. Name of Medication: Potassium Chloride ER 20 MEQ TBCR  2. How are you currently taking this medication (dosage and times per day)? Take 10 mEq by mouth daily.  3. Are you having a reaction (difficulty breathing--STAT)? no  4. What is your medication issue? Patient states he saw [facility] yesterday and she stop his Lasix but did not stop his Potassium.  His wondering if he should stop taking his Potassium as well.

## 2018-08-05 NOTE — Telephone Encounter (Signed)
Called and spoke to patient. Patient's lasix was d/c'd at OV 2/12. Patient wanting to know if he should stop k-dur as well. Instructed patient to stop k-dur since he is not on lasix. Made patient aware that I would forward to [facility] to make her aware as well. Patient verbalized understanding and thanked me for the call.

## 2018-08-05 NOTE — Telephone Encounter (Signed)
Agree with stopping kdur since lasix stopped. Thank you

## 2018-08-09 ENCOUNTER — Ambulatory Visit (HOSPITAL_COMMUNITY): Payer: Medicare HMO | Attending: Internal Medicine

## 2018-08-09 DIAGNOSIS — I255 Ischemic cardiomyopathy: Secondary | ICD-10-CM | POA: Diagnosis not present

## 2018-08-10 ENCOUNTER — Telehealth: Payer: Self-pay | Admitting: Physician Assistant

## 2018-08-10 ENCOUNTER — Telehealth: Payer: Self-pay | Admitting: Cardiology

## 2018-08-10 DIAGNOSIS — R69 Illness, unspecified: Secondary | ICD-10-CM | POA: Diagnosis not present

## 2018-08-10 DIAGNOSIS — R350 Frequency of micturition: Secondary | ICD-10-CM | POA: Diagnosis not present

## 2018-08-10 DIAGNOSIS — I25119 Atherosclerotic heart disease of native coronary artery with unspecified angina pectoris: Secondary | ICD-10-CM | POA: Diagnosis not present

## 2018-08-10 DIAGNOSIS — R634 Abnormal weight loss: Secondary | ICD-10-CM | POA: Diagnosis not present

## 2018-08-10 DIAGNOSIS — I5022 Chronic systolic (congestive) heart failure: Secondary | ICD-10-CM | POA: Diagnosis not present

## 2018-08-10 DIAGNOSIS — I255 Ischemic cardiomyopathy: Secondary | ICD-10-CM | POA: Diagnosis not present

## 2018-08-10 DIAGNOSIS — I10 Essential (primary) hypertension: Secondary | ICD-10-CM | POA: Diagnosis not present

## 2018-08-10 DIAGNOSIS — I480 Paroxysmal atrial fibrillation: Secondary | ICD-10-CM | POA: Diagnosis not present

## 2018-08-10 NOTE — Telephone Encounter (Signed)
The patient has been notified of the result and verbalized understanding.  Patient denied having any Sx of fluid overload at this time and will continue to remain off of lasix. He will let us know if he has any change in his Sx. All questions (if any) were answered.

## 2018-08-10 NOTE — Telephone Encounter (Signed)
CVTS started warfarin post CABG/mitral valve repair on 05/13/18. Patient did have a run of afib on 11/26, however patient converted to NSR after amiodarone and no recurrent afib was noted. Patient asking how long he needs to be on coumadin and the indication. I will route to Dr. Dorris Fetch for clarification and potential end date.

## 2018-08-10 NOTE — Telephone Encounter (Signed)
-----   Message from Dyann Kief, [place] sent at 08/10/2018  2:30 PM EST ----- Echo still shows ejection fraction 25 to 30% so no improvement in this at this point.  If he does not have any fluid buildup can hold off on Lasix.

## 2018-08-10 NOTE — Telephone Encounter (Signed)
° ° °  Rose from [place], PCP office [phone] calling, would like to know the diagnosis for patient to be on Coumadin. Patient has contacted their office wanting to know why he needs to continue Coumadin

## 2018-08-10 NOTE — Telephone Encounter (Signed)
New Message ° ° °Patient is returning call in reference to echocardiogram results.  °

## 2018-08-11 ENCOUNTER — Telehealth: Payer: Self-pay | Admitting: Cardiology

## 2018-08-11 DIAGNOSIS — R69 Illness, unspecified: Secondary | ICD-10-CM | POA: Diagnosis not present

## 2018-08-11 NOTE — Telephone Encounter (Signed)
New Message   Michael Banks is calling and Dr Chales Salmon is wondering about the PTs coumadin and the diagnosis for being on coumadin.  Please call back

## 2018-08-12 NOTE — Telephone Encounter (Signed)
If his a-fib was nly in the post op period with no recurrence I would do ASA only

## 2018-08-12 NOTE — Telephone Encounter (Signed)
CVTS started warfarin post mitral valve repair on 05/13/18. Patient did have a run of afib on 11/26, however patient converted to NSR after amiodarone and no recurrent afib was noted. Patient initial indication for warfarin was mitral valve repair, which is typically only used for a few months. Patient has been discharged from the CVTS service, however no mention of warfarin in the last note. I routed a previous message from patient to Dr. Dorris Fetch asking about duration, but have not heard back. PCP office again calling about indication/duration.   I will route this to patients primary cardiologist and CVTS as coumadin clinic does not make the decision to start or stop coumadin.

## 2018-08-15 NOTE — Telephone Encounter (Signed)
I spoke to the patient after informing him that we will stop the Coumadin and keep him on Aspirin only.  He is questioning why this decision was made.  Please advise, thank you

## 2018-08-15 NOTE — Telephone Encounter (Signed)
He only had one brief episode of a-fib post op but is otherwise in SR

## 2018-08-15 NOTE — Telephone Encounter (Signed)
°  Patient is requesting more of a  reason why his Warfarin was discontinued

## 2018-08-15 NOTE — Telephone Encounter (Signed)
Spoke to patient with Dr Lindaann Slough reasoning behind stopping Coumadin.  He only had 1 episode of Afib post op.  He verbalized understanding.

## 2018-08-15 NOTE — Telephone Encounter (Signed)
Spoke to patient and informed him of Kelley's and Dr Lindaann Slough recommendation to stop Coumadin and start Aspirin 81 mg daily.  He verbalized understanding.

## 2018-08-15 NOTE — Telephone Encounter (Signed)
Per Dr. Delton See note please discontinue warfarin and start ASA 81mg  daily. We can cancel upcoming coumadin appt once discontinued. Thanks!

## 2018-08-16 ENCOUNTER — Telehealth: Payer: Self-pay | Admitting: Cardiology

## 2018-08-16 NOTE — Telephone Encounter (Signed)
I will route to Aggie Hacker, LPN for Dr. Delton See as Lorain Childes.

## 2018-08-16 NOTE — Telephone Encounter (Signed)
I will route to [facility] as FYI.

## 2018-08-16 NOTE — Telephone Encounter (Addendum)
Advised the pt that yes, according to Herma Carson PA-C last OV note with the pt, he is to continue on low dose lisinopril and carvedilol, as well as his atorvastatin, which was endorsed to him to continue taking based on his recent lipid panel he had done at our office in early Feb.  Informed the pt that he will follow-up as planned with Herma Carson PA-C for 09/06/18 at 12:30 pm. Pt verbalized understanding and agrees with this plan.

## 2018-08-16 NOTE — Telephone Encounter (Signed)
° ° ° °  Patient wants to know if he needs he to continue taking lisinopril (PRINIVIL,ZESTRIL) 5 MG tablet, carvedilol (COREG) 3.125 MG tablet and atorvastatin (LIPITOR) 80 MG tablet

## 2018-09-02 ENCOUNTER — Other Ambulatory Visit: Payer: Self-pay | Admitting: Cardiology

## 2018-09-05 ENCOUNTER — Ambulatory Visit: Payer: Medicare HMO | Admitting: Registered"

## 2018-09-06 ENCOUNTER — Ambulatory Visit: Payer: Medicare HMO | Admitting: Physician Assistant

## 2018-09-09 DIAGNOSIS — R69 Illness, unspecified: Secondary | ICD-10-CM | POA: Diagnosis not present

## 2018-09-10 ENCOUNTER — Other Ambulatory Visit: Payer: Self-pay | Admitting: Cardiology

## 2018-10-05 DIAGNOSIS — I10 Essential (primary) hypertension: Secondary | ICD-10-CM | POA: Diagnosis not present

## 2018-10-05 DIAGNOSIS — E1169 Type 2 diabetes mellitus with other specified complication: Secondary | ICD-10-CM | POA: Diagnosis not present

## 2018-10-05 DIAGNOSIS — I25119 Atherosclerotic heart disease of native coronary artery with unspecified angina pectoris: Secondary | ICD-10-CM | POA: Diagnosis not present

## 2018-10-05 DIAGNOSIS — I480 Paroxysmal atrial fibrillation: Secondary | ICD-10-CM | POA: Diagnosis not present

## 2018-10-05 DIAGNOSIS — R69 Illness, unspecified: Secondary | ICD-10-CM | POA: Diagnosis not present

## 2018-10-05 DIAGNOSIS — I255 Ischemic cardiomyopathy: Secondary | ICD-10-CM | POA: Diagnosis not present

## 2018-10-31 DIAGNOSIS — E1169 Type 2 diabetes mellitus with other specified complication: Secondary | ICD-10-CM | POA: Diagnosis not present

## 2018-10-31 DIAGNOSIS — I10 Essential (primary) hypertension: Secondary | ICD-10-CM | POA: Diagnosis not present

## 2018-11-02 DIAGNOSIS — E785 Hyperlipidemia, unspecified: Secondary | ICD-10-CM | POA: Diagnosis not present

## 2018-11-02 DIAGNOSIS — I255 Ischemic cardiomyopathy: Secondary | ICD-10-CM | POA: Diagnosis not present

## 2018-11-02 DIAGNOSIS — I10 Essential (primary) hypertension: Secondary | ICD-10-CM | POA: Diagnosis not present

## 2018-11-02 DIAGNOSIS — I480 Paroxysmal atrial fibrillation: Secondary | ICD-10-CM | POA: Diagnosis not present

## 2018-11-02 DIAGNOSIS — E1169 Type 2 diabetes mellitus with other specified complication: Secondary | ICD-10-CM | POA: Diagnosis not present

## 2018-11-15 DIAGNOSIS — R69 Illness, unspecified: Secondary | ICD-10-CM | POA: Diagnosis not present

## 2018-11-24 ENCOUNTER — Telehealth: Payer: Self-pay | Admitting: Cardiology

## 2018-11-24 NOTE — Telephone Encounter (Signed)
New Message   Patient states that he had his cholesterol checked and his numbers were good. He is wanting to know should he continue taking his cholesterol medication. Please call.

## 2018-11-24 NOTE — Telephone Encounter (Signed)
Pt calling to ask if he should continue taking his cholesterol medicine, for his PCP did labs on him and his numbers improved. ,  Pts PCP is not in the system, and she has not faxed over his labs to our office yet, however pt requested that she do. Pt states he is having no complications with his statin he is taking, he just didn't know the protocol if you continue taking this, if your numbers improve. Pt states that he is actually in the transition of switching to another PCP, preferably the [facility], for they are in the [facility] system and all his Speciality will be able to see what each other orders and results.  Advised the pt that he should continue his current med regimen we have advised on, especially his statin, for that's why his numbers did improve, and will maintain those numbers. Advised the pt to proceed with having his current PCP fax over his labs to our office.  Advised the pt that we will see him as planned with Dr Delton See as a virtual visit on 12/14/18 at 1:20 pm. Pt verbalized understanding and agrees with this plan. Pt more than gracious for all the assistance provided.

## 2018-12-04 ENCOUNTER — Other Ambulatory Visit: Payer: Self-pay | Admitting: Cardiology

## 2018-12-05 ENCOUNTER — Telehealth: Payer: Self-pay | Admitting: *Deleted

## 2018-12-05 NOTE — Telephone Encounter (Signed)

## 2018-12-13 ENCOUNTER — Telehealth: Payer: Self-pay | Admitting: *Deleted

## 2018-12-13 NOTE — Telephone Encounter (Signed)
    COVID-19 Pre-Screening Questions:  . In the past 7 to 10 days have you had a cough,  shortness of breath, headache, congestion, fever (100 or greater) body aches, chills, sore throat, or sudden loss of taste or sense of smell?-NO . Have you been around anyone with known Covid 19.-NO . Have you been around anyone who is awaiting Covid 19 test results in the past 7 to 10 days?-NO . Have you been around anyone who has been exposed to Covid 19, or has mentioned symptoms of Covid 19 within the past 7 to 10 days?-NO  Did pts covid pre-screening questions for tomorrow's visit with Dr Meda Coffee on 6/24 at 1:20 pm.  Pt is aware to wear his face mask.  Pt is aware of no visitor policy. Pt verbalized understanding and agrees with this plan.

## 2018-12-14 ENCOUNTER — Encounter (INDEPENDENT_AMBULATORY_CARE_PROVIDER_SITE_OTHER): Payer: Self-pay

## 2018-12-14 ENCOUNTER — Other Ambulatory Visit: Payer: Self-pay

## 2018-12-14 ENCOUNTER — Encounter: Payer: Self-pay | Admitting: Cardiology

## 2018-12-14 ENCOUNTER — Ambulatory Visit: Payer: Medicare HMO | Admitting: Cardiology

## 2018-12-14 VITALS — BP 154/98 | HR 80 | Ht 70.0 in | Wt 168.0 lb

## 2018-12-14 DIAGNOSIS — I5022 Chronic systolic (congestive) heart failure: Secondary | ICD-10-CM

## 2018-12-14 DIAGNOSIS — I25810 Atherosclerosis of coronary artery bypass graft(s) without angina pectoris: Secondary | ICD-10-CM

## 2018-12-14 DIAGNOSIS — K9289 Other specified diseases of the digestive system: Secondary | ICD-10-CM

## 2018-12-14 MED ORDER — CARVEDILOL 6.25 MG PO TABS: 6.2500 mg | ORAL_TABLET | Freq: Two times a day (BID) | ORAL | 3 refills | Status: DC

## 2018-12-14 NOTE — Patient Instructions (Signed)
Medication Instructions:  Your physician has recommended you make the following change in your medication:  1.  INCREASE the Carvedilol to 6/25 mg taking 1 tablet twice a day   If you need a refill on your cardiac medications before your next appointment, please call your pharmacy.   Lab work: None ordered  If you have labs (blood work) drawn today and your tests are completely normal, you will receive your results only by: Marland Kitchen. MyChart Message (if you have MyChart) OR . A paper copy in the mail If you have any lab test that is abnormal or we need to change your treatment, we will call you to review the results.  Testing/Procedures: Your physician has requested that you have an echocardiogram. Echocardiography is a painless test that uses sound waves to create images of your heart. It provides your doctor with information about the size and shape of your heart and how well your heart's chambers and valves are working. This procedure takes approximately one hour. There are no restrictions for this procedure.  Your physician has requested that you have a lower extremity arterial exercise duplex. During this test, exercise and ultrasound are used to evaluate arterial blood flow in the legs. Allow one hour for this exam. There are no restrictions or special instructions.   Follow-Up: At [facility], you and your health needs are our priority.  As part of our continuing mission to provide you with exceptional heart care, we have created designated Provider Care Teams.  These Care Teams include your primary Cardiologist (physician) and Advanced Practice Providers (APPs -  Physician Assistants and Nurse Practitioners) who all work together to provide you with the care you need, when you need it. You will need a follow up appointment in 4 months.  Please call our office 2 months in advance to schedule this appointment.  You may see Tobias AlexanderKatarina Nelson, MD or one of the following Advanced Practice  Providers on your designated Care Team:   AshlandBrittainy Simmons, PA-C Ronie Spiesayna Dunn, PA-C . Jacolyn ReedyMichele Lenze, PA-C  Any Other Special Instructions Will Be Listed Below (If Applicable).  Echocardiogram An echocardiogram is a procedure that uses painless sound waves (ultrasound) to produce an image of the heart. Images from an echocardiogram can provide important information about:  Signs of coronary artery disease (CAD).  Aneurysm detection. An aneurysm is a weak or damaged part of an artery wall that bulges out from the normal force of blood pumping through the body.  Heart size and shape. Changes in the size or shape of the heart can be associated with certain conditions, including heart failure, aneurysm, and CAD.  Heart muscle function.  Heart valve function.  Signs of a past heart attack.  Fluid buildup around the heart.  Thickening of the heart muscle.  A tumor or infectious growth around the heart valves. Tell a health care provider about:  Any allergies you have.  All medicines you are taking, including vitamins, herbs, eye drops, creams, and over-the-counter medicines.  Any blood disorders you have.  Any surgeries you have had.  Any medical conditions you have.  Whether you are pregnant or may be pregnant. What are the risks? Generally, this is a safe procedure. However, problems may occur, including:  Allergic reaction to dye (contrast) that may be used during the procedure. What happens before the procedure? No specific preparation is needed. You may eat and drink normally. What happens during the procedure?   An IV tube may be inserted into one  of your veins.  You may receive contrast through this tube. A contrast is an injection that improves the quality of the pictures from your heart.  A gel will be applied to your chest.  A wand-like tool (transducer) will be moved over your chest. The gel will help to transmit the sound waves from the transducer.  The  sound waves will harmlessly bounce off of your heart to allow the heart images to be captured in real-time motion. The images will be recorded on a computer. The procedure may vary among health care providers and hospitals. What happens after the procedure?  You may return to your normal, everyday life, including diet, activities, and medicines, unless your health care provider tells you not to do that. Summary  An echocardiogram is a procedure that uses painless sound waves (ultrasound) to produce an image of the heart.  Images from an echocardiogram can provide important information about the size and shape of your heart, heart muscle function, heart valve function, and fluid buildup around your heart.  You do not need to do anything to prepare before this procedure. You may eat and drink normally.  After the echocardiogram is completed, you may return to your normal, everyday life, unless your health care provider tells you not to do that. This information is not intended to replace advice given to you by your health care provider. Make sure you discuss any questions you have with your health care provider. Document Released: 06/05/2000 Document Revised: 07/11/2016 Document Reviewed: 07/11/2016 Elsevier Interactive Patient Education  2019 [employer].

## 2018-12-14 NOTE — Progress Notes (Signed)
Cardiology Office Note    Date:  12/14/2018   ID:  Michael Banks, DOB [birth year], MRN [id number]  PCP:  Leeroy Cha, MD  Cardiologist: Ena Dawley, MD EPS: None  No chief complaint on file.  History of Present Illness:  Michael Banks is a 68 y.o. male  with history of hypertension, HLD, tobacco abuse admitted 04/2018 with shortness of breath and orthopnea.  Echo showed severely reduced LV function ejection fraction 20 to 25% with moderate MR.  Cardiac catheterization revealed severe CAD and severe MR.  Mild cardio viability study revealed viability in 12 out of 17 segments.  He underwent CABG x5 and MV repair 05/13/2018.  Briefly had atrial fibrillation postop converted to normal sinus rhythm with amiodarone.  He is to be maintained on Coumadin and aspirin per CVTS.  No ace inhibitor or ARB or Spironolactone given because of elevated creatinine in the hospital.  Last creatinine we have in the system is 0.88 on 05/19/2018.  Will need lifelong SBE prophylaxis.   Patient missed his initial postop follow-up.  I saw him 07/06/2018 at which time he had 25 pound weight loss and no appetite and lost his taste buds.  Blood pressure was low at 80/62.  I decrease his Lasix to 20 mg once daily and potassium 10 mEq daily.  12/13/2018 -the patient is coming after 3 months, he has been doing well, he denies any shortness of breath orthopnea or proximal nocturnal dyspnea, no lower extremity edema.  He is suffering from significant flatulence that sometimes can cause epigastric/chest pain, he has also noticed bluish discoloration of his right foot.  He denies any palpitation dizziness or syncope.  He has been compliant with his medications.  He states that the blood pressure at home is in the 120-1 30 range.  Past Medical History:  Diagnosis Date   Dilated cardiomyopathy [facility])     Past Surgical History:  Procedure Laterality Date   CORONARY ARTERY BYPASS GRAFT N/A 05/13/2018   Procedure: CORONARY ARTERY BYPASS GRAFTING (CABG) times _five__ using left internal mammary artery and right  greater saphenous vein harvested endoscopically.;  Surgeon: Melrose Nakayama, MD;  Location: [place];  Service: Open Heart Surgery;  Laterality: N/A;   KNEE ARTHROSCOPY     MITRAL VALVE REPAIR N/A 05/13/2018   Procedure: Mitral Valve Repair;  Surgeon: Melrose Nakayama, MD;  Location: [place];  Service: Open Heart Surgery;  Laterality: N/A;   NOSE SURGERY     RIGHT/LEFT HEART CATH AND CORONARY ANGIOGRAPHY N/A 05/05/2018   Procedure: RIGHT/LEFT HEART CATH AND CORONARY ANGIOGRAPHY;  Surgeon: Leonie Man, MD;  Location: [place] [facility];  Service: Cardiovascular;  Laterality: N/A;   TEE WITHOUT CARDIOVERSION N/A 05/13/2018   Procedure: TRANSESOPHAGEAL ECHOCARDIOGRAM (TEE);  Surgeon: Melrose Nakayama, MD;  Location: [place];  Service: Open Heart Surgery;  Laterality: N/A;   ULTRASOUND GUIDANCE FOR VASCULAR ACCESS  05/05/2018   Procedure: Ultrasound Guidance For Vascular Access;  Surgeon: Leonie Man, MD;  Location: [place] [facility];  Service: Cardiovascular;;    Current Medications: Current Meds  Medication Sig   aspirin EC 81 MG EC tablet Take 1 tablet (81 mg total) by mouth daily.   atorvastatin (LIPITOR) 80 MG tablet TAKE 1 TABLET BY MOUTH EVERY DAY AT 6 PM   Blood Glucose Monitoring Suppl (ONE TOUCH ULTRA 2) w/Device KIT    carvedilol (COREG) 3.125 MG tablet TAKE 1 TABLET BY MOUTH TWICE A DAY WITH MEAL  Lancets (ONETOUCH DELICA PLUS [id number]) MISC    lisinopril (ZESTRIL) 5 MG tablet TAKE 1 TABLET BY MOUTH EVERY DAY   ONETOUCH ULTRA test strip      Allergies:   Patient has no known allergies.   Social History   Socioeconomic History   Marital status: Single    Spouse name: Not on file   Number of children: Not on file   Years of education: Not on file   Highest education level: Not on file  Occupational History   Not on file    Social Needs   Financial resource strain: Not on file   Food insecurity    Worry: Not on file    Inability: Not on file   Transportation needs    Medical: Not on file    Non-medical: Not on file  Tobacco Use   Smoking status: Former Smoker   Smokeless tobacco: Never Used  Substance and Sexual Activity   Alcohol use: Never    Frequency: Never   Drug use: Never   Sexual activity: Not on file  Lifestyle   Physical activity    Days per week: Not on file    Minutes per session: Not on file   Stress: Not on file  Relationships   Social connections    Talks on phone: Not on file    Gets together: Not on file    Attends religious service: Not on file    Active member of club or organization: Not on file    Attends meetings of clubs or organizations: Not on file    Relationship status: Not on file  Other Topics Concern   Not on file  Social History Narrative   Not on file     Family History:  The patient's family history includes CAD in his brother; CVA in his father.   ROS:   Please see the history of present illness.    Review of Systems  Constitution: Positive for weight loss.  HENT: Negative.   Cardiovascular: Negative.   Respiratory: Negative.   Endocrine: Negative.   Hematologic/Lymphatic: Negative.   Musculoskeletal: Negative.   Gastrointestinal: Negative.   Genitourinary: Negative.   Neurological: Negative.    All other systems reviewed and are negative.   PHYSICAL EXAM:   VS:  BP (!) 154/98    Pulse 80    Ht '5\' 10"'  (1.778 m)    Wt 168 lb (76.2 kg)    SpO2 99%    BMI 24.11 kg/m   Physical Exam  GEN: Thin, in no acute distress  Neck: no JVD, carotid bruits, or masses Cardiac:RRR; no murmurs, rubs, or gallops  Respiratory: Decreased breath sounds but clear to auscultation bilaterally, normal work of breathing GI: soft, nontender, nondistended, + BS Ext: without cyanosis, clubbing, or edema, dorsalis pedis pulse felt on the right but not  posterior tibialis pulse, on the left both pulses are felt.  Neuro:  Alert and Oriented x 3 Psych: euthymic mood, full affect  Wt Readings from Last 3 Encounters:  12/14/18 168 lb (76.2 kg)  08/03/18 157 lb 6.4 oz (71.4 kg)  07/12/18 159 lb (72.1 kg)      Studies/Labs Reviewed:   EKG:  EKG is not ordered today.  Recent Labs: 05/03/2018: B Natriuretic Peptide 2,703.3 05/14/2018: Magnesium 2.3 05/19/2018: Hemoglobin 10.6; Platelets 309 07/06/2018: BUN 10; Creatinine, Ser 0.80; Potassium 4.8; Sodium 140 08/03/2018: ALT 17   Lipid Panel    Component Value Date/Time   CHOL 122 08/03/2018 0819  TRIG 76 08/03/2018 0819   HDL 46 08/03/2018 0819   CHOLHDL 2.7 08/03/2018 0819   CHOLHDL 4.2 05/04/2018 0433   VLDL 11 05/04/2018 0433   LDLCALC 61 08/03/2018 0819    Additional studies/ records that were reviewed today include:   Cardiac cath 05/05/2018  ISCHEMIC CARDIOMYOPATHY  Hemodynamic findings consistent with moderate pulmonary hypertension -secondary to pulmonary venous hypertension  LV end diastolic pressure and pulmonary capillary wedge pressure are moderately elevated.  There is severe (4+) mitral regurgitation.  SEVERE MULTIVESSEL DISEASE  Prox RCA to Mid RCA lesion is 90% stenosed. Mid RCA to Dist RCA lesion is 100% stenosed with 100% stenosed side branch in Post Atrio.  Prox LAD lesion is 99% stenosed. Mid LAD-1 lesion is 80% stenosed. Mid LAD-2 lesion is 90% stenosed. Dist LAD lesion is 85% stenosed.  Prox Cx lesion is 95% stenosed. 2nd Mrg lesion is 90% stenosed with 90% stenosed side branch in Lat 2nd Mrg.   SUMMARY  Severe multivessel CAD: Diffuse proximal RCA disease with mid 100% CTO filling with bridging collaterals and right-to-left collaterals, separate ostium circumflex with proximal 95% stenosis and bifurcation 90% stenosis of OM1, 99% mid LAD after very large septal perforator trunk (perfusing PDA) with bridging collaterals filling mid to distal LAD  with a large diagonal branch and diffuse disease throughout the distal LAD.  Moderate pulmonary hypertension likely secondary to pulmonary venous congestion and mitral regurgitation.  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     RECOMMENDATION  Return to nursing unit for TR band removal.  Continue aggressive CHF management  The patient would likely best benefit from CABG plus possible mitral valve repair. --We will consult CVTS  Will place on IV heparin 8 hours after sheath removal  Consider viability study as part of assessment for revascularization options.  Percutaneous options would be limited and extremely high risk given his low output and multivessel disease.  Would likely need support with Impella  Recommend Aspirin 48m daily for severe CAD.    Echo 05/04/2018 Study Conclusions   - Left ventricle: The cavity size was moderately dilated. Systolic   function was severely reduced. The estimated ejection fraction   was in the range of 20% to 25%. Indeterminate diastolic function.   Severe diffuse hypokinesis. - Regional wall motion abnormality: Akinesis of the mid   inferolateral, basal-mid anterolateral, and apical lateral   myocardium. - Aortic valve: Transvalvular velocity was within the normal range.   There was no stenosis. There was no regurgitation. - Mitral valve: Mitral valve leaflet tenting. There was moderate   regurgitation. The PISA quantitation may slightly underestimate   severity, with one jet more central, and one jet eccentric and   anteriorly directed (clip 71). - Left atrium: The atrium was mildly dilated. - Right ventricle: The cavity size was mildly dilated. Wall   thickness was normal.  Systolic function was moderately reduced. - Tricuspid valve: There was trivial regurgitation. - Inferior vena cava: The vessel was dilated. The respirophasic   diameter changes were blunted (< 50%), consistent with elevated   central venous pressure. - Pericardium, extracardiac: Pleural effusion.    ASSESSMENT:    1. Chronic systolic heart failure ([place]   2. Gas bloat syndrome   3. CAD of autologous artery bypass graft  without angina     PLAN:  In order of problems listed above:  CAD status post CABG x5 and mitral valve repair 04/2018 on Lipitor,  aspirin.  He has no recurrent chest pain.  Ischemic cardiomyopathy ejection fraction 20 to 25% on low-dose lisinopril.  We will schedule follow-up echo now, if his EF remains low we will switch to [facility] and refer for ICD implantation.  I will increase carvedilol to 6.25 p.o. twice daily  Chronic systolic CHF -appears euvolemic  Essential hypertension on low-dose lisinopril and carvedilol, will increase carvedilol as he is hypertensive  Hyperlipidemia on Lipitor that is well-tolerated  Bluish discoloration of his right foot, dorsalis pedis pulse felt on the right but not posterior tibialis pulse, on the left both pulses are felt.  We will refer for arterial duplex.  Medication Adjustments/Labs and Tests Ordered: Current medicines are reviewed at length with the patient today.  Concerns regarding medicines are outlined above.  Medication changes, Labs and Tests ordered today are listed in the Patient Instructions below. There are no Patient Instructions on file for this visit.   Signed, Ena Dawley, MD  12/14/2018 1:51 PM    [facility] [address], [place], [place]  [address] Phone: [phone]; Fax: [phone]

## 2018-12-16 DIAGNOSIS — R69 Illness, unspecified: Secondary | ICD-10-CM | POA: Diagnosis not present

## 2018-12-17 DIAGNOSIS — R69 Illness, unspecified: Secondary | ICD-10-CM | POA: Diagnosis not present

## 2018-12-22 ENCOUNTER — Other Ambulatory Visit: Payer: Self-pay | Admitting: Cardiology

## 2018-12-22 DIAGNOSIS — R0989 Other specified symptoms and signs involving the circulatory and respiratory systems: Secondary | ICD-10-CM

## 2018-12-22 DIAGNOSIS — R230 Cyanosis: Secondary | ICD-10-CM

## 2018-12-28 ENCOUNTER — Encounter (HOSPITAL_COMMUNITY): Payer: Medicare HMO

## 2018-12-29 ENCOUNTER — Other Ambulatory Visit (HOSPITAL_COMMUNITY): Payer: Medicare HMO

## 2019-01-12 DIAGNOSIS — R69 Illness, unspecified: Secondary | ICD-10-CM | POA: Diagnosis not present

## 2019-01-23 ENCOUNTER — Other Ambulatory Visit (HOSPITAL_COMMUNITY): Payer: Medicare HMO

## 2019-01-31 ENCOUNTER — Encounter (INDEPENDENT_AMBULATORY_CARE_PROVIDER_SITE_OTHER): Payer: Self-pay

## 2019-01-31 ENCOUNTER — Ambulatory Visit (HOSPITAL_COMMUNITY): Payer: Medicare HMO | Attending: Cardiology

## 2019-01-31 ENCOUNTER — Other Ambulatory Visit: Payer: Self-pay

## 2019-01-31 DIAGNOSIS — I5022 Chronic systolic (congestive) heart failure: Secondary | ICD-10-CM | POA: Diagnosis present

## 2019-02-03 ENCOUNTER — Other Ambulatory Visit: Payer: Self-pay

## 2019-02-03 DIAGNOSIS — I5022 Chronic systolic (congestive) heart failure: Secondary | ICD-10-CM

## 2019-02-03 MED ORDER — SACUBITRIL-VALSARTAN 24-26 MG PO TABS: 1.0000 | ORAL_TABLET | Freq: Two times a day (BID) | ORAL | Status: DC

## 2019-02-03 NOTE — Progress Notes (Signed)
The patient has been notified of the result and verbalized understanding.  All questions (if any) were answered. Lab appt made for 8/21 Linsinopril order canceled-pt aware to STOP taking and Entresto RX sent to [facility], pt aware to wait 48 hrs before starting Entresto. Pt states he was recently seen by Dr. Meda Coffee and does not need a follow up appt at this time.  Wilma Flavin, RN 02/03/2019 10:32 AM

## 2019-02-06 ENCOUNTER — Telehealth: Payer: Self-pay

## 2019-02-06 ENCOUNTER — Telehealth: Payer: Self-pay | Admitting: *Deleted

## 2019-02-06 MED ORDER — ENTRESTO 24-26 MG PO TABS: 1.0000 | ORAL_TABLET | Freq: Two times a day (BID) | ORAL | 3 refills | Status: DC

## 2019-02-06 NOTE — Telephone Encounter (Addendum)
Per Ivy's (Dr Nelson's nurse) concern for her pts cost for his Delene Loll I called his pharmacy and was advised that since the pt picked his RX up using a free 30 day co-pay card she does not know what his cost will be until he refills.  I called the pt who states that his pharmacy advised him that without the card Delene Loll will cost him $300 month and he cannot afford that.  I asked him if he has a deductible to meet and he stated that he did not know. I advised him to contact his insurance provider to ask 1. If he does owe a deducible? 2. How much is his deductibleif one is owed? 3. How much will Entresto cost him once his deductible is met?  He stated "I dont even know why they changed my medicine cause I was doing fine". I went over his Echo results with him and advised Delene Loll was prescribed due to his EF of 25%-30% and how Entresto can help him.  After some encouragement, he stated that he will try Entresto for about 2 weeks as he got 30 days free and if he likes it he will contact his insurance company to find out his cost for it. Otherwise he states that he would rather stay on Lisinopril as it did not cost him anything.  He states that he will call either me or Ivy to let us know if he wants to change back to Lisinopril or if he needs help paying for Entresto.

## 2019-02-06 NOTE — Telephone Encounter (Signed)
ENTRESTO MEDICATION RESENT TO THE PTS PHARMACY.

## 2019-02-07 ENCOUNTER — Telehealth: Payer: Self-pay | Admitting: Cardiology

## 2019-02-07 NOTE — Telephone Encounter (Signed)
Spoke with the pt.  His insurance carrier [employer] will not cover for him to take Entresto at all.  He was able to get the 30 day free supply of Entresto 24-26 mg yesterday and has started taking this medication, but after 30 days, he will not be able to afford this medication, for even with a prior auth and with him meeting his deductible, the med will still cost $300 a month.  Pt states [employer] told him that they would cover for him to take Valsartan.  Pt would like for Dr. Meda Coffee to advise on what he should take once he completes his 30 days worth of free Entresto? Also will he need to go ahead and be referred now to EP, vs waiting to talk more about this at his next visit with [employer], being he will not get approved to take this med long-term? Informed the pt that I will route this message to Dr. Meda Coffee to advise on this entire message, and I will follow-up with him shortly thereafter with what she recommends. Did inform the pt that yes, he should continue taking ASA with his med regimen. Pt verbalized understanding and agrees with this plan.

## 2019-02-07 NOTE — Telephone Encounter (Signed)
  Pt c/o medication issue:  1. Name of Medication: sacubitril-valsartan (ENTRESTO) 24-26 MG  2. How are you currently taking this medication (dosage and times per day)? As directed  3. Are you having a reaction (difficulty breathing--STAT)?  No  4. What is your medication issue? Patient's insurance will not pay for brand name Entresto. He will need the prescription written for generic so they will cover it.   Patient also would like to make sure it is safe to take aspirin with the [facility]

## 2019-02-08 NOTE — Telephone Encounter (Signed)
Karlene Einstein, I talked to [facility], she would like you to call her tomorrow and discuss his insurance plan and how we can possibly get him his medications, can you please call her tomorrow 02/09/2019 on her cell phone [phone]. Thank you, Houston Siren

## 2019-02-09 NOTE — Telephone Encounter (Signed)
**Note De-Identified Eiman Maret Obfuscation** The pt called me back to let me know that he got an alert yesterday when he checked his BP that his HB was irregular and that he checked it again after we spoke and again got the alert that his HB is irregular. He is advised that I will let Karlene Einstein (Dr Thera Flake nurse)  know and that she will call him back to discuss.  He thanked me and stated "I dont know if Im just upset over Entresto or what".

## 2019-02-09 NOTE — Telephone Encounter (Signed)
Called pt about message left with our Prior Auth Nurse.  Pt states his BP is 147/87 and HR-66.  Pt states he is asymptomatic with no palpitations, fluttering, or any other cardiac complaints at this time.  Pt states his BP cuff keeps alarming "irregular HR," but he feels great and does not feel a regular HR.  Pt will see Ellen Henri PA-C on 9/3 to discuss his Delene Loll, and possible referral to EP for noted low EF on echo.  Advised the pt to continue with his regimen and call us if he develops any symptoms. Pt verbalized understanding and agrees with this plan. Will send this entire message to Dr. Meda Coffee as an Juluis Rainier.

## 2019-02-09 NOTE — Telephone Encounter (Addendum)
See phone note from 8/18 for update.

## 2019-02-09 NOTE — Telephone Encounter (Addendum)
I called [employer] and s/w Barnett Applebaum who advised me that they do cover the pts Entresto. She states that Delene Loll is a tier 3 medication and that the pt owes a deductible of $250 so his co-pay at this time is $297 until deductible is met then the cost goes down to $47/30 day supply. Barnett Applebaum reports that the price of Entresto without ins coverage is $552.38 for a month supply so they are paying.  The pt is not eligible for pt asst as he has ins coverage and until his deductible is met the cost of Delene Loll will be more expensive.  I called Hinton Dyer who is our Entresto drug rep. Hinton Dyer agrees that until the pt meets his deductible there is not more we can offer except to advise him to call [employer] to see if they can offer him help with the cost.  I will call the pt to discuss.

## 2019-02-09 NOTE — Telephone Encounter (Addendum)
**Note De-Identified Lydell Moga Obfuscation** I called the pt to discuss.  He states that he really cannot afford Entresto as he has a lot of bills which includes all of his hospital/medical bills and his cost of living. He states that he needs to be on a generic medication due to cost and because "I was doing fine before Delene Loll was prescribed". I advised him that Delene Loll does not have a generic at this time and I went over his Echo results with him again and advised Delene Loll was prescribed due to his EF of 25%-30% and how Delene Loll can help him.  He requests that he discuss this at his 02/23/2019 appt. that is scheuled with Ellen Henri, PA_c because he just cannot afford Entresto.  I encouraged him to contact [employer] pt asst and he did write down the phone number for [employer] Pt Asst. But it is unclear if he will call them.

## 2019-02-10 ENCOUNTER — Telehealth: Payer: Self-pay | Admitting: *Deleted

## 2019-02-10 ENCOUNTER — Other Ambulatory Visit: Payer: Medicare HMO | Admitting: *Deleted

## 2019-02-10 ENCOUNTER — Other Ambulatory Visit: Payer: Self-pay

## 2019-02-10 DIAGNOSIS — I5022 Chronic systolic (congestive) heart failure: Secondary | ICD-10-CM | POA: Diagnosis not present

## 2019-02-10 LAB — BASIC METABOLIC PANEL
BUN/Creatinine Ratio: 11 (ref 10–24)
BUN: 11 mg/dL (ref 8–27)
CO2: 25 mmol/L (ref 20–29)
Calcium: 9.3 mg/dL (ref 8.6–10.2)
Chloride: 101 mmol/L (ref 96–106)
Creatinine, Ser: 0.96 mg/dL (ref 0.76–1.27)
GFR calc Af Amer: 95 mL/min/{1.73_m2} (ref >59)
GFR calc non Af Amer: 83 mL/min/{1.73_m2} (ref >59)
Glucose: 126 mg/dL — ABNORMAL HIGH (ref 65–99)
Potassium: 4.9 mmol/L (ref 3.5–5.2)
Sodium: 140 mmol/L (ref 134–144)

## 2019-02-10 NOTE — Telephone Encounter (Signed)
Pt left a handwritten message for Dr. Meda Coffee, myself, and Prior Auth Nurse Jeani Hawking, stating that he would like to be switched back to Lisinopril 5 mg po daily, for he states he cannot afford Entresto, and his deductible is too high.  Pt replied he also has too many medical bills to pay.  Pt was given a 30 day free supply of Entresto 24/26 mg to take, which he is taking and had his BMET done today for that. Dr. Meda Coffee, should pt continue with his 30 day supply, stop this med thereafter, and restart back taking Lisinopril 5 mg po daily?  And if so, does he have to be off of Entresto for so many hours before restarting Lisinopril? Please advise!

## 2019-02-11 NOTE — Telephone Encounter (Signed)
Yes, continue with his 30 day supply, stop this med thereafter, and restart back taking Lisinopril 5 mg po daily afterwards

## 2019-02-13 DIAGNOSIS — R69 Illness, unspecified: Secondary | ICD-10-CM | POA: Diagnosis not present

## 2019-02-13 MED ORDER — LISINOPRIL 5 MG PO TABS: 5.0000 mg | ORAL_TABLET | Freq: Every day | ORAL | 0 refills | Status: DC

## 2019-02-13 NOTE — Telephone Encounter (Signed)
**Note De-Identified Shonya Sumida Obfuscation** The pt is advised and he verbalized understanding. He states that he is relieved as he did not know how he would afford Entresto. He states that once he gets his finances in order he may want to try Entresto at that time and will let us know.  He thanked me for calling him back with Dr Thera Flake recommendations.  I have updated his med list: Entresto removed and Lisinopril 5 mg daily added back.

## 2019-02-13 NOTE — Addendum Note (Signed)
**Note De-Identified [place] Obfuscation** Addended by: Dennie Fetters on: 02/13/2019 09:45 AM   Modules accepted: Orders

## 2019-02-14 ENCOUNTER — Telehealth: Payer: Self-pay | Admitting: Cardiology

## 2019-02-14 NOTE — Telephone Encounter (Signed)
Yes pt will need to keep this appt for there will need to be discussion of what we need to do for noted low LVEF on echo and he wants to speak with Provider about recommended referral to EP, for possible ICD implantation.  Dr. Meda Coffee advised him to keep this appt so that this could be discussed.  His recent echo was abnormal and he cannot afford Entresto after 30 days, so at that visit there will need to be discussion of referring him to EP for ICD.  Please keep in office visit.

## 2019-02-14 NOTE — Telephone Encounter (Signed)
New message     Called pt to reschedule his 02-23-19 2wk entresto follow up appointment with Mare Loan.  Patient stated that he talked to a nurse yesterday stating that he was not going to stay on entresto.  He states that he will finish his 30 day supply and then start taking lisinopril again.  Will patient still need to have the 2wk entresto follow up appointment since he will not stay on this medication?  Appointment is currently scheduled for 02-22-19 with Ermalinda Barrios.

## 2019-02-21 NOTE — Progress Notes (Signed)
Cardiology Office Note    Date:  02/22/2019   ID:  FREMON ZACHARIA, DOB 10/18/1950, MRN [id number]  PCP:  Leeroy Cha, MD  Cardiologist: Ena Dawley, MD EPS: None  No chief complaint on file.   History of Present Illness:  Michael Banks is a 68 y.o. male with history of HTN, HLD, tobaccoa abuse. Admitted 04/2018 with CHF LVEF 20-25% found to have severe CAD and MR and underwent CABG x 5 and MV repair 05/13/18. Brief Afib post op treated with Amio. He was to stay on Coumadin and ASA per CVTS.   I saw the patient 07/06/18 at which time he lost 25 lbs no appetite and BP 80/62. Saw Dr. Meda Coffee 12/13/18 and doing well on low dose lisinopril. F/u echo 02/07/19 LVEF 25-30% and switch to [facility] which he's unable to afford. Dr. Meda Coffee recommends referal to EP for possible ICD  Patient can't afford entresto so wants to go back on lisinopril. Complains of a lot of gas since pcp stopped glyburide. Sharp shooting pain and relieved with passing gas. Has urinary frequency. Denies chest pain, dyspnea, dizziness or presyncope. Took BP last month and machine told him he had an arrhthymia.    Past Medical History:  Diagnosis Date  . Dilated cardiomyopathy [facility])     Past Surgical History:  Procedure Laterality Date  . CORONARY ARTERY BYPASS GRAFT N/A 05/13/2018   Procedure: CORONARY ARTERY BYPASS GRAFTING (CABG) times _five__ using left internal mammary artery and right  greater saphenous vein harvested endoscopically.;  Surgeon: Melrose Nakayama, MD;  Location: [place];  Service: Open Heart Surgery;  Laterality: N/A;  . KNEE ARTHROSCOPY    . MITRAL VALVE REPAIR N/A 05/13/2018   Procedure: Mitral Valve Repair;  Surgeon: Melrose Nakayama, MD;  Location: [place];  Service: Open Heart Surgery;  Laterality: N/A;  . NOSE SURGERY    . RIGHT/LEFT HEART CATH AND CORONARY ANGIOGRAPHY N/A 05/05/2018   Procedure: RIGHT/LEFT HEART CATH AND CORONARY ANGIOGRAPHY;  Surgeon: Leonie Man,  MD;  Location: [place] [facility];  Service: Cardiovascular;  Laterality: N/A;  . TEE WITHOUT CARDIOVERSION N/A 05/13/2018   Procedure: TRANSESOPHAGEAL ECHOCARDIOGRAM (TEE);  Surgeon: Melrose Nakayama, MD;  Location: [place];  Service: Open Heart Surgery;  Laterality: N/A;  . ULTRASOUND GUIDANCE FOR VASCULAR ACCESS  05/05/2018   Procedure: Ultrasound Guidance For Vascular Access;  Surgeon: Leonie Man, MD;  Location: [place] [facility];  Service: Cardiovascular;;    Current Medications: Current Meds  Medication Sig  . aspirin EC 81 MG EC tablet Take 1 tablet (81 mg total) by mouth daily.  Marland Kitchen atorvastatin (LIPITOR) 80 MG tablet TAKE 1 TABLET BY MOUTH EVERY DAY AT 6 PM  . Blood Glucose Monitoring Suppl (ONE TOUCH ULTRA 2) w/Device KIT   . carvedilol (COREG) 25 MG tablet Take 1 tablet (25 mg total) by mouth 2 (two) times daily.  . Lancets (ONETOUCH DELICA PLUS [id number]) Crystal   . lisinopril (ZESTRIL) 5 MG tablet Take 1 tablet (5 mg total) by mouth daily.  Glory Rosebush ULTRA test strip   . [DISCONTINUED] carvedilol (COREG) 6.25 MG tablet Take 1 tablet (6.25 mg total) by mouth 2 (two) times daily.     Allergies:   Patient has no known allergies.   Social History   Socioeconomic History  . Marital status: Single    Spouse name: Not on file  . Number of children: Not on file  . Years of education: Not on  file  . Highest education level: Not on file  Occupational History  . Not on file  Social Needs  . Financial resource strain: Not on file  . Food insecurity    Worry: Not on file    Inability: Not on file  . Transportation needs    Medical: Not on file    Non-medical: Not on file  Tobacco Use  . Smoking status: Former Research scientist (life sciences)  . Smokeless tobacco: Never Used  Substance and Sexual Activity  . Alcohol use: Never    Frequency: Never  . Drug use: Never  . Sexual activity: Not on file  Lifestyle  . Physical activity    Days per week: Not on file    Minutes per session: Not  on file  . Stress: Not on file  Relationships  . Social Herbalist on phone: Not on file    Gets together: Not on file    Attends religious service: Not on file    Active member of club or organization: Not on file    Attends meetings of clubs or organizations: Not on file    Relationship status: Not on file  Other Topics Concern  . Not on file  Social History Narrative  . Not on file     Family History:  The patient's family history includes CAD in his brother; CVA in his father.   ROS:   Please see the history of present illness.    ROS All other systems reviewed and are negative.   PHYSICAL EXAM:   VS:  BP 112/60   Pulse 70   Ht _0  (1.778 m)   Wt 173 lb (78.5 kg)   SpO2 99%   BMI 24.82 kg/m   Physical Exam  GEN: Well nourished, well developed, in no acute distress  Neck: no JVD, carotid bruits, or masses Cardiac:RRR; no murmurs, rubs, or gallops  Respiratory:  clear to auscultation bilaterally, normal work of breathing GI: soft, nontender, nondistended, + BS Ext: without cyanosis, clubbing, or edema, Good distal pulses bilaterally Neuro:  Alert and Oriented x 3 Psych: euthymic mood, full affect  Wt Readings from Last 3 Encounters:  02/22/19 173 lb (78.5 kg)  12/14/18 168 lb (76.2 kg)  08/03/18 157 lb 6.4 oz (71.4 kg)      Studies/Labs Reviewed:   EKG:  EKG is not ordered today.    Recent Labs: 05/03/2018: B Natriuretic Peptide 2,703.3 05/14/2018: Magnesium 2.3 05/19/2018: Hemoglobin 10.6; Platelets 309 08/03/2018: ALT 17 02/10/2019: BUN 11; Creatinine, Ser 0.96; Potassium 4.9; Sodium 140   Lipid Panel    Component Value Date/Time   CHOL 122 08/03/2018 0819   TRIG 76 08/03/2018 0819   HDL 46 08/03/2018 0819   CHOLHDL 2.7 08/03/2018 0819   CHOLHDL 4.2 05/04/2018 0433   VLDL 11 05/04/2018 0433   LDLCALC 61 08/03/2018 0819    Additional studies/ records that were reviewed today include:  Echo 8/18/201. The left ventricle has  moderate-severely reduced systolic function, with an ejection fraction of 25-30%. The cavity size was normal. Left ventricular diastolic Doppler parameters are consistent with impaired relaxation. Elevated left ventricular  end-diastolic pressure Left ventricular diffuse hypokinesis. Probable apical akinesis. Patient refused definity contrast study therefore cannot rule out apical thrombus.  2. The average left ventricular global longitudinal strain is -8.8 %.  3. The aortic valve is tricuspid. Aortic valve regurgitation is mild by color flow Doppler.  4. The right ventricle has normal systolic function. The cavity was  normal. There is no increase in right ventricular wall thickness. Right ventricular systolic pressure could not be assessed.  5. S/P Mitral valve repair. There is mild mitral regurgitation and mild mitral stenosis with mean MV gradient 7mHg.  6. There is mild dilatation of the aortic root measuring 41 mm.  7. The interatrial septum appears to be lipomatous.   FINDINGS  Left Ventricle: The left ventricle has severely reduced systolic function, with an ejection fraction of 25-30%. The cavity size was normal. There is no increase in left ventricular wall thickness. Left ventricular diastolic Doppler parameters are  consistent with impaired relaxation. Elevated left ventricular end-diastolic pressure Left ventricular diffuse hypokinesis. The average left ventricular global longitudinal strain is -8.8 %.   Right Ventricle: The right ventricle has normal systolic function. The cavity was normal. There is no increase in right ventricular wall thickness. Right ventricular systolic pressure could not be assessed.   Left Atrium: Left atrial size was normal in size.   Right Atrium: Right atrial size was normal in size. Right atrial pressure is estimated at 3 mmHg.   Interatrial Septum: No atrial level shunt detected by color flow Doppler. Increased thickness of the atrial septum sparing the  fossa ovalis consistent with The interatrial septum appears to be lipomatous.   Pericardium: There is no evidence of pericardial effusion.   Mitral Valve: The mitral valve has been repaired/replaced.Mitral valve regurgitation is mild by color flow Doppler. S/P Mitral valve repair. There is mild mitral regurgitation and mild mitral stenosis with mean MV gradient 460mg.   Tricuspid Valve: The tricuspid valve is normal in structure. Tricuspid valve regurgitation was not visualized by color flow Doppler.   Aortic Valve: The aortic valve is tricuspid Aortic valve regurgitation is mild by color flow Doppler.   Pulmonic Valve: The pulmonic valve was normal in structure. Pulmonic valve regurgitation is not visualized by color flow Doppler.   Aorta: The aorta is abnormal in size and structure. There is mild dilatation of the aortic root measuring 41 mm.   Venous: The inferior vena cava measures 0.60 cm, is normal in size with greater than 50% respiratory variability.   Compared to previous exam: 08/09/18 EF 25-30%.    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  There is severe (4+) mitral regurgitation.  SEVERE MULTIVESSEL DISEASE  Prox RCA to Mid RCA lesion is 90% stenosed. Mid RCA to Dist RCA lesion is 100% stenosed with 100% stenosed side branch in Post Atrio.  Prox LAD lesion is 99% stenosed. Mid LAD-1 lesion is 80% stenosed. Mid LAD-2 lesion is 90% stenosed. Dist LAD lesion is 85% stenosed.  Prox Cx lesion is 95% stenosed. 2nd Mrg lesion is 90% stenosed with 90% stenosed side branch in Lat 2nd Mrg.   SUMMARY  Severe multivessel CAD: Diffuse proximal RCA disease with mid 100% CTO filling with bridging collaterals and right-to-left collaterals, separate ostium circumflex with  proximal 95% stenosis and bifurcation 90% stenosis of OM1, 99% mid LAD after very large septal perforator trunk (perfusing PDA) with bridging collaterals filling mid to distal LAD with a large diagonal branch and diffuse disease throughout the distal LAD.  Moderate pulmonary hypertension likely secondary to pulmonary venous congestion and mitral regurgitation.  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     RECOMMENDATION  Return to nursing unit for TR band removal.  Continue aggressive CHF management  The patient would likely best benefit from CABG plus possible mitral valve repair. --We will consult CVTS  Will place on IV heparin 8 hours after sheath removal  Consider viability study as part of assessment for revascularization options.  Percutaneous options would be limited and extremely high risk given his low output and multivessel disease.  Would likely need support with Impella  Recommend Aspirin 36m daily for severe CAD.    Echo 05/04/2018 Study Conclusions   - Left ventricle: The cavity size was moderately dilated. Systolic   function was severely reduced. The estimated ejection fraction   was in the range of 20% to 25%. Indeterminate diastolic function.   Severe diffuse hypokinesis. - Regional wall motion abnormality: Akinesis of the mid   inferolateral, basal-mid anterolateral, and apical lateral   myocardium. - Aortic valve: Transvalvular velocity was within the normal range.   There was no stenosis. There was no regurgitation. - Mitral valve: Mitral valve leaflet tenting. There was moderate   regurgitation. The PISA quantitation may slightly underestimate   severity, with one jet more central, and one jet  eccentric and   anteriorly directed (clip 71). - Left atrium: The atrium was mildly dilated. - Right ventricle: The cavity size was mildly dilated. Wall   thickness was normal. Systolic function was moderately reduced. - Tricuspid valve: There was trivial regurgitation. - Inferior vena cava: The vessel was dilated. The respirophasic   diameter changes were blunted (< 50%), consistent with elevated   central venous pressure. - Pericardium, extracardiac: Pleural effusion.            ASSESSMENT:    1. 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 Ischemic cardiomyopathy   3. Chronic systolic heart failure [place]   4. Essential hypertension   5. Mixed hyperlipidemia   6. Gastroesophageal reflux disease, esophagitis presence not specified      PLAN:  In order of problems listed above:   CAD status post CABG x5 and mitral valve repair 04/2018 on Lipitor and aspirin  Ischemic cardiomyopathy ejection fraction 25 to 30% on repeat echo 02/07/19. low-dose lisinopril switched to entresto but patient can't afford. Dr. NMeda Coffeerecommends referral to EP for ICD. Will restart lisinopril 5 mg once daily and increase coreg 25 mg daily.    Chronic systolic CHF compensated  Essential hypertension controlled  Hyperlipidemia on Lipitor    GERD and excessive gas. Will start protonix    Medication Adjustments/Labs and Tests Ordered: Current medicines are reviewed at length with the patient today.  Concerns regarding medicines are outlined above.  Medication changes, Labs and Tests ordered today are listed in the Patient Instructions below. Patient Instructions  Medication Instructions:  Your physician has recommended you make the following change in your medication:   1. INCREASE: carvedilol to 25 mg twice a day  2. START: pantoprazole (protonix) 20 mg tablet: Take 1 tablet by mouth once a day  3. STOP: entresto when you run out and then wait 36 hours and then START:  lisinopril 5 mg: Take 1 tablet by mouth once a day   Lab work: None Ordered  If you have labs (blood work) drawn today and your tests are completely normal, you will receive your results only by: .Marland KitchenMyChart  Message (if you have MyChart) OR . A paper copy in the mail If you have any lab test that is abnormal or we need to change your treatment, we will call you to review the results.  Testing/Procedures: None ordered  Follow-Up: You have been referred to Electrophysiology to discuss having a defibrillator  . Follow up with Ermalinda Barrios, PA in 2 months. We will call you with an appointment.  Any Other Special Instructions Will Be Listed Below (If Applicable).       Sumner Boast, PA-C  02/22/2019 11:43 AM    [facility] [address], [place], [place]  [address] Phone: [phone]; Fax: [phone]

## 2019-02-22 ENCOUNTER — Ambulatory Visit (INDEPENDENT_AMBULATORY_CARE_PROVIDER_SITE_OTHER): Payer: Medicare HMO | Admitting: Physician Assistant

## 2019-02-22 ENCOUNTER — Other Ambulatory Visit: Payer: Self-pay

## 2019-02-22 ENCOUNTER — Encounter: Payer: Self-pay | Admitting: Physician Assistant

## 2019-02-22 VITALS — BP 112/60 | HR 70 | Ht 70.0 in | Wt 173.0 lb

## 2019-02-22 DIAGNOSIS — K219 Gastro-esophageal reflux disease without esophagitis: Secondary | ICD-10-CM

## 2019-02-22 DIAGNOSIS — I25810 Atherosclerosis of coronary artery bypass graft(s) without angina pectoris: Secondary | ICD-10-CM

## 2019-02-22 DIAGNOSIS — E782 Mixed hyperlipidemia: Secondary | ICD-10-CM | POA: Diagnosis not present

## 2019-02-22 DIAGNOSIS — I10 Essential (primary) hypertension: Secondary | ICD-10-CM | POA: Diagnosis not present

## 2019-02-22 DIAGNOSIS — I255 Ischemic cardiomyopathy: Secondary | ICD-10-CM | POA: Diagnosis not present

## 2019-02-22 DIAGNOSIS — I5022 Chronic systolic (congestive) heart failure: Secondary | ICD-10-CM

## 2019-02-22 MED ORDER — PANTOPRAZOLE SODIUM 20 MG PO TBEC: 20.0000 mg | DELAYED_RELEASE_TABLET | Freq: Every day | ORAL | 3 refills | Status: DC

## 2019-02-22 MED ORDER — CARVEDILOL 25 MG PO TABS: 25.0000 mg | ORAL_TABLET | Freq: Two times a day (BID) | ORAL | 3 refills | Status: DC

## 2019-02-22 NOTE — Patient Instructions (Addendum)
Medication Instructions:  Your physician has recommended you make the following change in your medication:   1. INCREASE: carvedilol to 25 mg twice a day  2. START: pantoprazole (protonix) 20 mg tablet: Take 1 tablet by mouth once a day  3. STOP: entresto when you run out and then wait 36 hours and then START: lisinopril 5 mg: Take 1 tablet by mouth once a day   Lab work: None Ordered  If you have labs (blood work) drawn today and your tests are completely normal, you will receive your results only by: Marland Kitchen MyChart Message (if you have MyChart) OR . A paper copy in the mail If you have any lab test that is abnormal or we need to change your treatment, we will call you to review the results.  Testing/Procedures: None ordered  Follow-Up: You have been referred to Electrophysiology to discuss having a defibrillator  . Follow up with Ermalinda Barrios, PA in 2 months. We will call you with an appointment.  Any Other Special Instructions Will Be Listed Below (If Applicable).

## 2019-02-23 ENCOUNTER — Ambulatory Visit: Payer: Medicare HMO | Admitting: Cardiology

## 2019-02-24 ENCOUNTER — Telehealth: Payer: Self-pay

## 2019-02-24 NOTE — Telephone Encounter (Signed)
Pt called this morning concerned about his Carvedilol being increased to 25mg /BID. He stated that his Systolic was 150 this morning.   He is currently on Entresto and when finished with it he was advised to d/c it and switch to Lisinopril.   He would like for someone to call him to discuss this.  He is a Liane Comber pt but seen Estella Husk yesterday.

## 2019-02-24 NOTE — Telephone Encounter (Signed)
Called and spoke to patient. Made him aware that per Ermalinda Barrios, PA he will wait and increase his carvedilol until when he stops the entresto and starts the lisinopril. Patient will continue to monitor BP and let us know if there are any issues. Patient grateful for the call.

## 2019-02-27 ENCOUNTER — Emergency Department (HOSPITAL_COMMUNITY): Payer: Medicare HMO

## 2019-02-27 ENCOUNTER — Inpatient Hospital Stay (HOSPITAL_COMMUNITY)
Admission: EM | Admit: 2019-02-27 | Discharge: 2019-03-01 | DRG: 690 | Disposition: A | Discharge status: Home / Self Care | Payer: Medicare HMO | Attending: Internal Medicine | Admitting: Internal Medicine

## 2019-02-27 ENCOUNTER — Encounter (HOSPITAL_COMMUNITY): Payer: Self-pay

## 2019-02-27 ENCOUNTER — Other Ambulatory Visit: Payer: Self-pay

## 2019-02-27 DIAGNOSIS — N132 Hydronephrosis with renal and ureteral calculous obstruction: Secondary | ICD-10-CM | POA: Diagnosis not present

## 2019-02-27 DIAGNOSIS — I110 Hypertensive heart disease with heart failure: Secondary | ICD-10-CM | POA: Diagnosis not present

## 2019-02-27 DIAGNOSIS — N1330 Unspecified hydronephrosis: Secondary | ICD-10-CM | POA: Diagnosis present

## 2019-02-27 DIAGNOSIS — I255 Ischemic cardiomyopathy: Secondary | ICD-10-CM | POA: Diagnosis not present

## 2019-02-27 DIAGNOSIS — R739 Hyperglycemia, unspecified: Secondary | ICD-10-CM | POA: Diagnosis not present

## 2019-02-27 DIAGNOSIS — R109 Unspecified abdominal pain: Secondary | ICD-10-CM | POA: Diagnosis not present

## 2019-02-27 DIAGNOSIS — N202 Calculus of kidney with calculus of ureter: Secondary | ICD-10-CM | POA: Diagnosis present

## 2019-02-27 DIAGNOSIS — N138 Other obstructive and reflux uropathy: Secondary | ICD-10-CM

## 2019-02-27 DIAGNOSIS — Z951 Presence of aortocoronary bypass graft: Secondary | ICD-10-CM

## 2019-02-27 DIAGNOSIS — I25810 Atherosclerosis of coronary artery bypass graft(s) without angina pectoris: Secondary | ICD-10-CM | POA: Diagnosis not present

## 2019-02-27 DIAGNOSIS — I5022 Chronic systolic (congestive) heart failure: Secondary | ICD-10-CM

## 2019-02-27 DIAGNOSIS — Z79899 Other long term (current) drug therapy: Secondary | ICD-10-CM

## 2019-02-27 DIAGNOSIS — Z87891 Personal history of nicotine dependence: Secondary | ICD-10-CM | POA: Diagnosis not present

## 2019-02-27 DIAGNOSIS — Z20828 Contact with and (suspected) exposure to other viral communicable diseases: Secondary | ICD-10-CM | POA: Diagnosis present

## 2019-02-27 DIAGNOSIS — E785 Hyperlipidemia, unspecified: Secondary | ICD-10-CM | POA: Diagnosis not present

## 2019-02-27 DIAGNOSIS — Z7982 Long term (current) use of aspirin: Secondary | ICD-10-CM | POA: Diagnosis not present

## 2019-02-27 DIAGNOSIS — Z8249 Family history of ischemic heart disease and other diseases of the circulatory system: Secondary | ICD-10-CM

## 2019-02-27 DIAGNOSIS — Z823 Family history of stroke: Secondary | ICD-10-CM | POA: Diagnosis not present

## 2019-02-27 DIAGNOSIS — N23 Unspecified renal colic: Secondary | ICD-10-CM

## 2019-02-27 DIAGNOSIS — I714 Abdominal aortic aneurysm, without rupture, unspecified: Secondary | ICD-10-CM | POA: Diagnosis present

## 2019-02-27 DIAGNOSIS — Z03818 Encounter for observation for suspected exposure to other biological agents ruled out: Secondary | ICD-10-CM | POA: Diagnosis not present

## 2019-02-27 DIAGNOSIS — N390 Urinary tract infection, site not specified: Secondary | ICD-10-CM

## 2019-02-27 DIAGNOSIS — N12 Tubulo-interstitial nephritis, not specified as acute or chronic: Secondary | ICD-10-CM

## 2019-02-27 DIAGNOSIS — N136 Pyonephrosis: Secondary | ICD-10-CM | POA: Diagnosis not present

## 2019-02-27 DIAGNOSIS — I10 Essential (primary) hypertension: Secondary | ICD-10-CM

## 2019-02-27 DIAGNOSIS — R1032 Left lower quadrant pain: Secondary | ICD-10-CM | POA: Diagnosis not present

## 2019-02-27 DIAGNOSIS — N200 Calculus of kidney: Secondary | ICD-10-CM

## 2019-02-27 HISTORY — DX: Hyperglycemia, unspecified: R73.9

## 2019-02-27 HISTORY — DX: Unspecified hydronephrosis: N13.30

## 2019-02-27 HISTORY — DX: Calculus of kidney: N20.0

## 2019-02-27 HISTORY — DX: Essential (primary) hypertension: I10

## 2019-02-27 LAB — COMPREHENSIVE METABOLIC PANEL
ALT: 28 U/L (ref 0–44)
AST: 26 U/L (ref 15–41)
Albumin: 4.1 g/dL (ref 3.5–5.0)
Alkaline Phosphatase: 59 U/L (ref 38–126)
Anion gap: 7 (ref 5–15)
BUN: 10 mg/dL (ref 8–23)
CO2: 28 mmol/L (ref 22–32)
Calcium: 8.8 mg/dL — ABNORMAL LOW (ref 8.9–10.3)
Chloride: 104 mmol/L (ref 98–111)
Creatinine, Ser: 1.06 mg/dL (ref 0.61–1.24)
GFR calc Af Amer: >60 mL/min (ref >60)
GFR calc non Af Amer: >60 mL/min (ref >60)
Glucose, Bld: 192 mg/dL — ABNORMAL HIGH (ref 70–99)
Potassium: 4.4 mmol/L (ref 3.5–5.1)
Sodium: 139 mmol/L (ref 135–145)
Total Bilirubin: 1.1 mg/dL (ref 0.3–1.2)
Total Protein: 7.2 g/dL (ref 6.5–8.1)

## 2019-02-27 LAB — CBC
HCT: 43.9 % (ref 39.0–52.0)
Hemoglobin: 14.9 g/dL (ref 13.0–17.0)
MCH: 30.9 pg (ref 26.0–34.0)
MCHC: 33.9 g/dL (ref 30.0–36.0)
MCV: 91.1 fL (ref 80.0–100.0)
Platelets: 224 10*3/uL (ref 150–400)
RBC: 4.82 MIL/uL (ref 4.22–5.81)
RDW: 12.6 % (ref 11.5–15.5)
WBC: 11.1 10*3/uL — ABNORMAL HIGH (ref 4.0–10.5)
nRBC: 0 % (ref 0.0–0.2)

## 2019-02-27 LAB — URINALYSIS, ROUTINE W REFLEX MICROSCOPIC
Bilirubin Urine: NEGATIVE
Glucose, UA: 50 mg/dL — AB
Ketones, ur: NEGATIVE mg/dL
Nitrite: POSITIVE — AB
Protein, ur: 30 mg/dL — AB
Specific Gravity, Urine: 1.013 (ref 1.005–1.030)
pH: 5 (ref 5.0–8.0)

## 2019-02-27 LAB — LIPASE, BLOOD: Lipase: 40 U/L (ref 11–51)

## 2019-02-27 MED ORDER — ACETAMINOPHEN 325 MG PO TABS: 650.0000 mg | ORAL_TABLET | Freq: Four times a day (QID) | ORAL | Status: DC | PRN

## 2019-02-27 MED ORDER — ASPIRIN EC 81 MG PO TBEC
81.0000 mg | DELAYED_RELEASE_TABLET | Freq: Every day | ORAL | Status: DC
  Administered 2019-02-28 – 2019-03-01 (×2): 81 mg via ORAL
  Filled 2019-02-27 (×2): qty 1

## 2019-02-27 MED ORDER — SACUBITRIL-VALSARTAN 24-26 MG PO TABS
1.0000 | ORAL_TABLET | Freq: Two times a day (BID) | ORAL | Status: DC
  Administered 2019-02-27: 1 via ORAL
  Filled 2019-02-27 (×5): qty 1

## 2019-02-27 MED ORDER — ATORVASTATIN CALCIUM 80 MG PO TABS
80.0000 mg | ORAL_TABLET | Freq: Every day | ORAL | Status: DC
  Administered 2019-02-27 – 2019-02-28 (×2): 80 mg via ORAL
  Filled 2019-02-27 (×2): qty 1

## 2019-02-27 MED ORDER — HEPARIN SODIUM (PORCINE) 5000 UNIT/ML IJ SOLN
5000.0000 [IU] | Freq: Three times a day (TID) | INTRAMUSCULAR | Status: DC
  Administered 2019-02-27 – 2019-03-01 (×5): 5000 [IU] via SUBCUTANEOUS
  Filled 2019-02-27 (×4): qty 1

## 2019-02-27 MED ORDER — ONDANSETRON HCL 4 MG PO TABS: 4.0000 mg | ORAL_TABLET | Freq: Four times a day (QID) | ORAL | Status: DC | PRN

## 2019-02-27 MED ORDER — PANTOPRAZOLE SODIUM 20 MG PO TBEC
20.0000 mg | DELAYED_RELEASE_TABLET | Freq: Every day | ORAL | Status: DC
  Administered 2019-02-27 – 2019-03-01 (×3): 20 mg via ORAL
  Filled 2019-02-27 (×3): qty 1

## 2019-02-27 MED ORDER — ACETAMINOPHEN 650 MG RE SUPP: 650.0000 mg | Freq: Four times a day (QID) | RECTAL | Status: DC | PRN

## 2019-02-27 MED ORDER — HYDROCODONE-ACETAMINOPHEN 5-325 MG PO TABS
1.0000 | ORAL_TABLET | ORAL | Status: DC | PRN
  Filled 2019-02-27: qty 1

## 2019-02-27 MED ORDER — ONDANSETRON HCL 4 MG/2ML IJ SOLN
4.0000 mg | Freq: Once | INTRAMUSCULAR | Status: AC
  Administered 2019-02-27: 4 mg via INTRAVENOUS
  Filled 2019-02-27: qty 2

## 2019-02-27 MED ORDER — TAMSULOSIN HCL 0.4 MG PO CAPS
0.4000 mg | ORAL_CAPSULE | Freq: Every day | ORAL | Status: DC
  Administered 2019-02-27 – 2019-03-01 (×3): 0.4 mg via ORAL
  Filled 2019-02-27 (×3): qty 1

## 2019-02-27 MED ORDER — MORPHINE SULFATE (PF) 4 MG/ML IV SOLN
4.0000 mg | Freq: Once | INTRAVENOUS | Status: AC
  Administered 2019-02-27: 4 mg via INTRAVENOUS
  Filled 2019-02-27: qty 1

## 2019-02-27 MED ORDER — ONDANSETRON HCL 4 MG/2ML IJ SOLN: 4.0000 mg | Freq: Four times a day (QID) | INTRAMUSCULAR | Status: DC | PRN

## 2019-02-27 MED ORDER — SODIUM CHLORIDE 0.9 % IV SOLN
1.0000 g | INTRAVENOUS | Status: DC
  Administered 2019-02-27 – 2019-02-28 (×2): 1 g via INTRAVENOUS
  Filled 2019-02-27 (×2): qty 1
  Filled 2019-02-27: qty 10

## 2019-02-27 MED ORDER — SODIUM CHLORIDE 0.9 % IV SOLN
1.0000 g | Freq: Once | INTRAVENOUS | Status: AC
  Administered 2019-02-27: 1 g via INTRAVENOUS
  Filled 2019-02-27: qty 10

## 2019-02-27 MED ORDER — CARVEDILOL 25 MG PO TABS
25.0000 mg | ORAL_TABLET | Freq: Two times a day (BID) | ORAL | Status: DC
  Administered 2019-02-27 – 2019-03-01 (×4): 25 mg via ORAL
  Filled 2019-02-27 (×4): qty 1

## 2019-02-27 MED ORDER — SODIUM CHLORIDE 0.9 % IV SOLN
INTRAVENOUS | Status: AC
  Administered 2019-02-27: 19:00:00 via INTRAVENOUS

## 2019-02-27 NOTE — Progress Notes (Signed)
Patient admitted to unit from emergency department, report received from [place], VSS. Patient settled in bed and oriented to unit and hospital routine. Pt resting comfortably in bed with call bell in reach and side rails up. Instructed patient to utilize call bell for assistance. Will continue to monitor closely for remainder of shift.

## 2019-02-27 NOTE — H&P (Signed)
History and Physical    Michael Banks:[id number] DOB: [birth year] DOA: 02/27/2019  PCP: Leeroy Cha, MD  Patient coming from: Home  I have personally briefly reviewed patient's old medical records in [place]  Chief Complaint: Abdominal pain  HPI: Michael Banks is a 68 y.o. male with medical history significant of coronary artery disease status post CABG, chronic systolic congestive heart failure with ejection fraction of 25 to 30%, presents to the hospital with complaints of left lower quadrant abdominal pain rating to his left flank.  Symptoms were mildly present yesterday, but have substantially gotten worse today.  He is not had any fever, nausea, vomiting, diarrhea.  No chest pain or shortness of breath.  He has been experiencing dyspepsia for the last month, symptoms usually improves when he passes gas.  He feels this is unrelated to his current presentation.  Denies any hematuria or dysuria.  ED Course: He was evaluated in the emergency room where he was noted to be hemodynamically stable.  Basic labs otherwise unrevealing.  Urinalysis indicated positive nitrites indicative of possible infection.  CT scan of the abdomen shows left-sided hydronephrosis with 2 mm stone as well as incidental finding of abdominal aortic aneurysm.  Case was reviewed by EDP with urology who recommended observation  Review of Systems:  General: Negative for fever, malaise, generalized weakness Respiratory: Negative for shortness of breath, cough, wheezing Cardiac: Negative for chest pain, shortness of breath, palpitations GI: Positive for left lower quadrant abdominal pain, negative for nausea, vomiting All other systems reviewed and found to be negative.   Past Medical History:  Diagnosis Date   Dilated cardiomyopathy [facility])     Past Surgical History:  Procedure Laterality Date   CORONARY ARTERY BYPASS GRAFT N/A 05/13/2018   Procedure: CORONARY ARTERY BYPASS GRAFTING (CABG)  times _five__ using left internal mammary artery and right  greater saphenous vein harvested endoscopically.;  Surgeon: Melrose Nakayama, MD;  Location: [place];  Service: Open Heart Surgery;  Laterality: N/A;   KNEE ARTHROSCOPY     MITRAL VALVE REPAIR N/A 05/13/2018   Procedure: Mitral Valve Repair;  Surgeon: Melrose Nakayama, MD;  Location: [place];  Service: Open Heart Surgery;  Laterality: N/A;   NOSE SURGERY     RIGHT/LEFT HEART CATH AND CORONARY ANGIOGRAPHY N/A 05/05/2018   Procedure: RIGHT/LEFT HEART CATH AND CORONARY ANGIOGRAPHY;  Surgeon: Leonie Man, MD;  Location: [place] [facility];  Service: Cardiovascular;  Laterality: N/A;   TEE WITHOUT CARDIOVERSION N/A 05/13/2018   Procedure: TRANSESOPHAGEAL ECHOCARDIOGRAM (TEE);  Surgeon: Melrose Nakayama, MD;  Location: [place];  Service: Open Heart Surgery;  Laterality: N/A;   ULTRASOUND GUIDANCE FOR VASCULAR ACCESS  05/05/2018   Procedure: Ultrasound Guidance For Vascular Access;  Surgeon: Leonie Man, MD;  Location: [place] [facility];  Service: Cardiovascular;;    Social History:  reports that he has quit smoking. He has never used smokeless tobacco. He reports that he does not drink alcohol or use drugs.  No Known Allergies  Family History  Problem Relation Age of Onset   CVA Father        died of "cerebral hemorrhage"   CAD Brother        died of "heart attack" at age 81     Prior to Admission medications   Medication Sig Start Date End Date Taking? Authorizing Provider  aspirin EC 81 MG EC tablet Take 1 tablet (81 mg total) by mouth daily. 05/20/18  Yes Gold, Wayne E, PA-C  atorvastatin (LIPITOR) 80 MG tablet TAKE 1 TABLET BY MOUTH EVERY DAY AT 6 PM Patient taking differently: Take 80 mg by mouth daily at 6 PM.  07/07/18  Yes Dorothy Spark, MD  carvedilol (COREG) 25 MG tablet Take 1 tablet (25 mg total) by mouth 2 (two) times daily. 02/22/19  Yes Imogene Burn, PA-C  sacubitril-valsartan  (ENTRESTO) 24-26 MG Take 1 tablet by mouth 2 (two) times daily.   Yes [provider]  Blood Glucose Monitoring Suppl (ONE TOUCH ULTRA 2) w/Device KIT  08/11/18   [provider]  Lancets (ONETOUCH DELICA PLUS [id number]) [place]  08/11/18   [provider]  lisinopril (ZESTRIL) 5 MG tablet Take 1 tablet (5 mg total) by mouth daily. 02/13/19 05/14/19  Dorothy Spark, MD  [facility] ULTRA test strip  11/15/18   [provider]  pantoprazole (PROTONIX) 20 MG tablet Take 1 tablet (20 mg total) by mouth daily. 02/22/19   Imogene Burn, PA-C    Physical Exam: Vitals:   02/27/19 1039 02/27/19 1141 02/27/19 1415 02/27/19 1432  BP: (!) 173/87 (!) 151/101 105/71 111/73  Pulse: 69 74  78  Resp: 20 (!) 22 16 (!) 21  Temp: 97.6 F (36.4 C)   (!) 97.4 F (36.3 C)  TempSrc: Oral   Oral  SpO2: 100% 100%  97%    Constitutional: NAD, calm, comfortable Eyes: PERRL, lids and conjunctivae normal ENMT: Mucous membranes are moist. Posterior pharynx clear of any exudate or lesions.Normal dentition.  Neck: normal, supple, no masses, no thyromegaly Respiratory: clear to auscultation bilaterally, no wheezing, no crackles. Normal respiratory effort. No accessory muscle use.  Cardiovascular: Regular rate and rhythm, no murmurs / rubs / gallops. No extremity edema. 2+ pedal pulses. No carotid bruits.  Abdomen: no tenderness, no masses palpated. No hepatosplenomegaly. Bowel sounds positive.  Musculoskeletal: no clubbing / cyanosis. No joint deformity upper and lower extremities. Good ROM, no contractures. Normal muscle tone.  Skin: no rashes, lesions, ulcers. No induration Neurologic: CN 2-12 grossly intact. Sensation intact, DTR normal. Strength 5/5 in all 4.  Psychiatric: Normal judgment and insight. Alert and oriented x 3. Normal mood.    Labs on Admission: I have personally reviewed following labs and imaging studies  CBC: Recent Labs  Lab 02/27/19 1050  WBC 11.1*  HGB  14.9  HCT 43.9  MCV 91.1  PLT 517   Basic Metabolic Panel: Recent Labs  Lab 02/27/19 1050  NA 139  K 4.4  CL 104  CO2 28  GLUCOSE 192*  BUN 10  CREATININE 1.06  CALCIUM 8.8*   GFR: Estimated Creatinine Clearance: 71.7 mL/min (by C-G formula based on SCr of 1.06 mg/dL). Liver Function Tests: Recent Labs  Lab 02/27/19 1050  AST 26  ALT 28  ALKPHOS 59  BILITOT 1.1  PROT 7.2  ALBUMIN 4.1   Recent Labs  Lab 02/27/19 1050  LIPASE 40   No results for input(s): AMMONIA in the last 168 hours. Coagulation Profile: No results for input(s): INR, PROTIME in the last 168 hours. Cardiac Enzymes: No results for input(s): CKTOTAL, CKMB, CKMBINDEX, TROPONINI in the last 168 hours. BNP (last 3 results) No results for input(s): PROBNP in the last 8760 hours. HbA1C: No results for input(s): HGBA1C in the last 72 hours. CBG: No results for input(s): GLUCAP in the last 168 hours. Lipid Profile: No results for input(s): CHOL, HDL, LDLCALC, TRIG, CHOLHDL, LDLDIRECT in the last 72 hours. Thyroid  Function Tests: No results for input(s): TSH, T4TOTAL, FREET4, T3FREE, THYROIDAB in the last 72 hours. Anemia Panel: No results for input(s): VITAMINB12, FOLATE, FERRITIN, TIBC, IRON, RETICCTPCT in the last 72 hours. Urine analysis:    Component Value Date/Time   COLORURINE YELLOW 02/27/2019 1049   APPEARANCEUR CLOUDY (A) 02/27/2019 1049   LABSPEC 1.013 02/27/2019 1049   PHURINE 5.0 02/27/2019 1049   GLUCOSEU 50 (A) 02/27/2019 1049   HGBUR LARGE (A) 02/27/2019 1049   BILIRUBINUR NEGATIVE 02/27/2019 1049   KETONESUR NEGATIVE 02/27/2019 1049   PROTEINUR 30 (A) 02/27/2019 1049   NITRITE POSITIVE (A) 02/27/2019 1049   LEUKOCYTESUR LARGE (A) 02/27/2019 1049    Radiological Exams on Admission: Ct Renal Stone Study  Result Date: 02/27/2019 CLINICAL DATA:  Standard/full stone LLQ/left flank pain x 2days, no prior abd surgery EXAM: CT ABDOMEN AND PELVIS WITHOUT CONTRAST TECHNIQUE:  Multidetector CT imaging of the abdomen and pelvis was performed following the standard protocol without IV contrast. COMPARISON:  Chest x-ray 07/12/2018 FINDINGS: Lower chest: Emphysematous changes are seen at the lung bases. No focal consolidations or pleural effusions. Elevation of the LEFT hemidiaphragm is stable. Previous mitral annuloplasty. Heart size is normal. No pericardial effusion. Coronary artery calcifications. Hepatobiliary: Faintly radiopaque gallstone, gallstones, or sludge. The liver is homogeneous without focal abnormality. Pancreas: Unremarkable. No pancreatic ductal dilatation or surrounding inflammatory changes. Spleen: Normal in size without focal abnormality. Adrenals/Urinary Tract: The adrenal glands are normal in appearance. There is LEFT-sided hydronephrosis. Perinephric stranding identified on the LEFT. A nonobstructing calculus is identified in the LOWER pole the LEFT kidney measuring 2 millimeters. The LEFT ureter is dilated and mildly tortuous to level of the ureterovesical junction where there is a 2 millimeter calculus. The RIGHT kidney and ureter are unremarkable. The urinary bladder is notable for layering high attenuation material, suspicious for small stones. Alternatively, the patient may have had a recent contrast administration with residual contrast in the bladder. Stomach/Bowel: The stomach is distended and contains layering debris and fluid. Small bowel loops are normal in caliber. There are extensive colonic diverticula. No associated inflammatory changes or obstruction. Moderate stool burden. The appendix is well seen and has a normal appearance. Vascular/Lymphatic: There is atherosclerotic calcification of the abdominal aorta. Infrarenal abdominal aortic aneurysm is 4.6 x 4.4 centimeters. No retroperitoneal or mesenteric adenopathy. Reproductive: There is dense calcification of the prostate gland. Suspect a hydrocele the RIGHT hemiscrotum. Other: No ascites.  Musculoskeletal: No acute or significant osseous findings. IMPRESSION: 1. Obstructing 2 millimeter calculus at the left ureterovesical junction. LEFT hydronephrosis and perinephric stranding. 2. Nonobstructing 2 mm calculus in the LOWER pole of the LEFT kidney. 3. Layering high attenuation material within the urinary bladder, suspicious for small stones or recent contrast administration. 4. Infrarenal abdominal aortic aneurysm measuring 4.6 cm. Recommend followup by abdomen and pelvis CTA in 6 months, and vascular surgery referral/consultation if not already obtained. This recommendation follows ACR consensus guidelines: White Paper of the ACR Incidental Findings Committee II on Vascular Findings. J Am Coll Radiol 2013; 10:789-794. Aortic aneurysm NOS (ICD10-I71.9) 5. Faintly radiopaque gallstone, gallstones, or sludge. 6. Coronary artery disease. Status post mitral annuloplasty. 7. Colonic diverticulosis. 8. Moderate stool burden. 9. Suspect hydrocele the RIGHT hemiscrotum. 10. Aortic Atherosclerosis (ICD10-I70.0) and Emphysema (ICD10-J43.9). Electronically Signed   By: Nolon Nations M.D.   On: 02/27/2019 12:42    Assessment/Plan Active Problems:   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   Ischemic cardiomyopathy   Chronic systolic heart failure ([place])  Essential hypertension   Hyperlipidemia   Ureteral colic   Hydronephrosis of left kidney   Urinary tract obstruction by kidney stone   Acute lower UTI   Abdominal aortic aneurysm (AAA) ([place])     1. Left-sided kidney stone with ureteral colic and left-sided hydronephrosis.  Urology was contacted by EDP who recommended admission for observation.  Continue on IV fluids and pain management.  If he fails to improve with conservative management, may need to undergo cystoscopy with stone removal. 2. Urinary tract infection.  Urinalysis indicates possible infection.  Started on Rocephin.  Follow-up urine culture. 3. Chronic systolic  congestive heart failure.  Appears compensated at this time.  Continue on Entresto, beta-blockers.  He does not appear to be on chronic diuretics. 4. Coronary artery status post CABG.  No complaints of chest pain at this time.  Continue aspirin, statin, beta-blocker 5. Abdominal aortic aneurysm.  Incidental finding on CT imaging.  Will need repeat imaging in 6 months with CT scan as well as outpatient vascular surgery referral. 6. Hyperlipidemia.  Continue statin 7. Hypertension.  Continue home regimen.  DVT prophylaxis: heparin Code Status: Full code Family Communication: None Disposition Plan: Discharge home once clinically improved Consults called: Urology Jeffie Pollock) Admission status: Observation, MedSurg  Kathie Dike MD [facility]   If 7PM-7AM, please contact night-coverage [email or web address]   02/27/2019, 4:21 PM

## 2019-02-27 NOTE — ED Triage Notes (Signed)
Pt endorses LLQ radiating to left flank x 1 month, has hx of gas and has been taking medication without relief. VSS.

## 2019-02-27 NOTE — ED Notes (Addendum)
Attempted to call report. Nurse Delsa Sale) said she would call back

## 2019-02-27 NOTE — ED Notes (Signed)
Gave patient the urinal to provide urine.

## 2019-02-27 NOTE — ED Provider Notes (Signed)
[facility] EMERGENCY DEPARTMENT Provider Note   CSN: [id number] Arrival date & time: 02/27/19  1031     History   Chief Complaint Chief Complaint  Patient presents with   Abdominal Pain    HPI Michael Banks is a 68 y.o. male.     Michael Banks is a 68 y.o. male with a history of cardiomyopathy, CAD, hypertension, hyperlipidemia, who presents to the ED for evaluation of left lower abdominal pain and left flank pain.  Patient reports over the past month he has been having intermittent abdominal pains that he felt were likely gas pains, but over the past 2 to 3 days he has began having sharp intermittent pain that started in his left flank and radiating to the left lower quadrant of his abdomen and yesterday pain became more persistent.  He reports some associated nausea but no vomiting.  No diarrhea, constipation or bloody stools.  Denies dysuria, urinary frequency or hematuria.  Reports a year or 2 ago he noted swelling area on the right testicle but is nontender, this is unchanged has not become worse with his current symptoms.  No associated chest pain or shortness of breath.  He denies any prior history of kidney stones or kidney infections.  No prior abdominal surgeries.  He has not taken anything for symptoms prior to arrival.  No other aggravating or alleviating factors.     Past Medical History:  Diagnosis Date   Dilated cardiomyopathy [facility])     Patient Active Problem List   Diagnosis Date Noted   Ureteral colic 16/60/6301   Hydronephrosis of left kidney 02/27/2019   Urinary tract obstruction by kidney stone 02/27/2019   Acute lower UTI 02/27/2019   Abdominal aortic aneurysm (AAA) ([place]) 02/27/2019   Ischemic cardiomyopathy 06/01/2018   S/P mitral valve repair 60/03/9322   Chronic systolic heart failure ([place]) 06/01/2018   Essential hypertension 06/01/2018   Hyperlipidemia 06/01/2018   Encounter for therapeutic drug monitoring  05/30/2018   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    New onset of congestive heart failure ([place])    Mitral valve insufficiency    Demand ischemia ([place])    Pulmonary edema 05/03/2018    Past Surgical History:  Procedure Laterality Date   CORONARY ARTERY BYPASS GRAFT N/A 05/13/2018   Procedure: CORONARY ARTERY BYPASS GRAFTING (CABG) times _five__ using left internal mammary artery and right  greater saphenous vein harvested endoscopically.;  Surgeon: Melrose Nakayama, MD;  Location: [place];  Service: Open Heart Surgery;  Laterality: N/A;   KNEE ARTHROSCOPY     MITRAL VALVE REPAIR N/A 05/13/2018   Procedure: Mitral Valve Repair;  Surgeon: Melrose Nakayama, MD;  Location: [place];  Service: Open Heart Surgery;  Laterality: N/A;   NOSE SURGERY     RIGHT/LEFT HEART CATH AND CORONARY ANGIOGRAPHY N/A 05/05/2018   Procedure: RIGHT/LEFT HEART CATH AND CORONARY ANGIOGRAPHY;  Surgeon: Leonie Man, MD;  Location: [place] CV LAB;  Service: Cardiovascular;  Laterality: N/A;   TEE WITHOUT CARDIOVERSION N/A 05/13/2018   Procedure: TRANSESOPHAGEAL ECHOCARDIOGRAM (TEE);  Surgeon: Melrose Nakayama, MD;  Location: [place];  Service: Open Heart Surgery;  Laterality: N/A;   ULTRASOUND GUIDANCE FOR VASCULAR ACCESS  05/05/2018   Procedure: Ultrasound Guidance For Vascular Access;  Surgeon: Leonie Man, MD;  Location: [place] CV LAB;  Service: Cardiovascular;;        Home Medications  Prior to Admission medications   Medication Sig Start Date End Date Taking? Authorizing Provider  aspirin EC 81 MG EC tablet Take 1 tablet (81 mg total) by mouth daily. 05/20/18  Yes Gold, Wayne E, PA-C  atorvastatin (LIPITOR) 80 MG tablet TAKE 1 TABLET BY MOUTH EVERY DAY AT 6 PM Patient taking differently: Take 80 mg by mouth daily at 6 PM.  07/07/18  Yes Dorothy Spark, MD  carvedilol (COREG) 25 MG tablet Take 1 tablet (25 mg total) by  mouth 2 (two) times daily. 02/22/19  Yes Imogene Burn, PA-C  sacubitril-valsartan (ENTRESTO) 24-26 MG Take 1 tablet by mouth 2 (two) times daily.   Yes [provider]  Blood Glucose Monitoring Suppl (ONE TOUCH ULTRA 2) w/Device KIT  08/11/18   [provider]  Lancets (ONETOUCH DELICA PLUS [id number]) [place]  08/11/18   [provider]  lisinopril (ZESTRIL) 5 MG tablet Take 1 tablet (5 mg total) by mouth daily. 02/13/19 05/14/19  Dorothy Spark, MD  [facility] ULTRA test strip  11/15/18   [provider]  pantoprazole (PROTONIX) 20 MG tablet Take 1 tablet (20 mg total) by mouth daily. 02/22/19   Imogene Burn, PA-C    Family History Family History  Problem Relation Age of Onset   CVA Father        died of "cerebral hemorrhage"   CAD Brother        died of "heart attack" at age 49    Social History Social History   Tobacco Use   Smoking status: Former Smoker   Smokeless tobacco: Never Used  Substance Use Topics   Alcohol use: Never    Frequency: Never   Drug use: Never     Allergies   Patient has no known allergies.   Review of Systems Review of Systems  Constitutional: Negative for chills and fever.  HENT: Negative.   Eyes: Negative for visual disturbance.  Respiratory: Negative for cough and shortness of breath.   Cardiovascular: Negative for chest pain.  Gastrointestinal: Positive for abdominal pain and nausea. Negative for blood in stool, constipation, diarrhea and vomiting.  Genitourinary: Positive for flank pain. Negative for dysuria, frequency, hematuria, penile swelling, scrotal swelling and testicular pain.  Skin: Negative for color change and rash.  Neurological: Negative for dizziness, syncope and light-headedness.     Physical Exam Updated Vital Signs BP (!) 173/87 (BP Location: Right Arm)    Pulse 69    Temp 97.6 F (36.4 C) (Oral)    Resp 20    SpO2 100%   Physical Exam Vitals signs and nursing note reviewed.   Constitutional:      General: He is not in acute distress.    Appearance: He is well-developed and normal weight. He is not ill-appearing or diaphoretic.  HENT:     Head: Normocephalic and atraumatic.     Mouth/Throat:     Mouth: Mucous membranes are moist.     Pharynx: Oropharynx is clear.  Eyes:     General:        Right eye: No discharge.        Left eye: No discharge.     Pupils: Pupils are equal, round, and reactive to light.  Neck:     Musculoskeletal: Neck supple.  Cardiovascular:     Rate and Rhythm: Normal rate and regular rhythm.     Heart sounds: Normal heart sounds.  Pulmonary:     Effort: Pulmonary effort is normal. No respiratory  distress.     Breath sounds: Normal breath sounds. No wheezing or rales.  Abdominal:     General: Bowel sounds are normal. There is no distension.     Palpations: Abdomen is soft. There is no mass.     Tenderness: There is abdominal tenderness in the left lower quadrant. There is left CVA tenderness. There is no guarding.     Comments: Abdomen is soft, nondistended, there is mild tenderness in the left lower quadrant, no guarding or peritoneal signs and abdomen otherwise nontender to palpation, there is left-sided CVA tenderness.  Genitourinary:    Comments: Chaperone present during genital exam.  No penile pain or swelling.  There is a palpable area of swelling just above the right testicle, this is nontender and there is no erythema or warmth over the scrotum.  No left testicular masses or swelling. Musculoskeletal:        General: No deformity.  Skin:    General: Skin is warm and dry.     Capillary Refill: Capillary refill takes less than 2 seconds.  Neurological:     Mental Status: He is alert.     Coordination: Coordination normal.     Comments: Speech is clear, able to follow commands Moves extremities without ataxia, coordination intact  Psychiatric:        Mood and Affect: Mood normal.        Behavior: Behavior normal.       ED Treatments / Results  Labs (all labs ordered are listed, but only abnormal results are displayed) Labs Reviewed  COMPREHENSIVE METABOLIC PANEL - Abnormal; Notable for the following components:      Result Value   Glucose, Bld 192 (*)    Calcium 8.8 (*)    All other components within normal limits  CBC - Abnormal; Notable for the following components:   WBC 11.1 (*)    All other components within normal limits  URINALYSIS, ROUTINE W REFLEX MICROSCOPIC - Abnormal; Notable for the following components:   APPearance CLOUDY (*)    Glucose, UA 50 (*)    Hgb urine dipstick LARGE (*)    Protein, ur 30 (*)    Nitrite POSITIVE (*)    Leukocytes,Ua LARGE (*)    Bacteria, UA RARE (*)    All other components within normal limits  URINE CULTURE  SARS CORONAVIRUS 2 (TAT 6-24 HRS)  LIPASE, BLOOD    EKG None  Radiology Ct Renal Stone Study  Result Date: 02/27/2019 CLINICAL DATA:  Standard/full stone LLQ/left flank pain x 2days, no prior abd surgery EXAM: CT ABDOMEN AND PELVIS WITHOUT CONTRAST TECHNIQUE: Multidetector CT imaging of the abdomen and pelvis was performed following the standard protocol without IV contrast. COMPARISON:  Chest x-ray 07/12/2018 FINDINGS: Lower chest: Emphysematous changes are seen at the lung bases. No focal consolidations or pleural effusions. Elevation of the LEFT hemidiaphragm is stable. Previous mitral annuloplasty. Heart size is normal. No pericardial effusion. Coronary artery calcifications. Hepatobiliary: Faintly radiopaque gallstone, gallstones, or sludge. The liver is homogeneous without focal abnormality. Pancreas: Unremarkable. No pancreatic ductal dilatation or surrounding inflammatory changes. Spleen: Normal in size without focal abnormality. Adrenals/Urinary Tract: The adrenal glands are normal in appearance. There is LEFT-sided hydronephrosis. Perinephric stranding identified on the LEFT. A nonobstructing calculus is identified in the LOWER pole  the LEFT kidney measuring 2 millimeters. The LEFT ureter is dilated and mildly tortuous to level of the ureterovesical junction where there is a 2 millimeter calculus. The RIGHT kidney and ureter are  unremarkable. The urinary bladder is notable for layering high attenuation material, suspicious for small stones. Alternatively, the patient may have had a recent contrast administration with residual contrast in the bladder. Stomach/Bowel: The stomach is distended and contains layering debris and fluid. Small bowel loops are normal in caliber. There are extensive colonic diverticula. No associated inflammatory changes or obstruction. Moderate stool burden. The appendix is well seen and has a normal appearance. Vascular/Lymphatic: There is atherosclerotic calcification of the abdominal aorta. Infrarenal abdominal aortic aneurysm is 4.6 x 4.4 centimeters. No retroperitoneal or mesenteric adenopathy. Reproductive: There is dense calcification of the prostate gland. Suspect a hydrocele the RIGHT hemiscrotum. Other: No ascites. Musculoskeletal: No acute or significant osseous findings. IMPRESSION: 1. Obstructing 2 millimeter calculus at the left ureterovesical junction. LEFT hydronephrosis and perinephric stranding. 2. Nonobstructing 2 mm calculus in the LOWER pole of the LEFT kidney. 3. Layering high attenuation material within the urinary bladder, suspicious for small stones or recent contrast administration. 4. Infrarenal abdominal aortic aneurysm measuring 4.6 cm. Recommend followup by abdomen and pelvis CTA in 6 months, and vascular surgery referral/consultation if not already obtained. This recommendation follows ACR consensus guidelines: White Paper of the ACR Incidental Findings Committee II on Vascular Findings. J Am Coll Radiol 2013; 10:789-794. Aortic aneurysm NOS (ICD10-I71.9) 5. Faintly radiopaque gallstone, gallstones, or sludge. 6. Coronary artery disease. Status post mitral annuloplasty. 7. Colonic  diverticulosis. 8. Moderate stool burden. 9. Suspect hydrocele the RIGHT hemiscrotum. 10. Aortic Atherosclerosis (ICD10-I70.0) and Emphysema (ICD10-J43.9). Electronically Signed   By: Nolon Nations M.D.   On: 02/27/2019 12:42    Procedures Procedures (including critical care time)  Medications Ordered in ED Medications  morphine 4 MG/ML injection 4 mg (4 mg Intravenous Given 02/27/19 1217)  ondansetron (ZOFRAN) injection 4 mg (4 mg Intravenous Given 02/27/19 1216)  cefTRIAXone (ROCEPHIN) 1 g in sodium chloride 0.9 % 100 mL IVPB (0 g Intravenous Stopped 02/27/19 1335)     Initial Impression / Assessment and Plan / ED Course  I have reviewed the triage vital signs and the nursing notes.  Pertinent labs & imaging results that were available during my care of the patient were reviewed by me and considered in my medical decision making (see chart for details).  Patient presents with left flank pain radiating to the groin which is been present and worsening over the past few days.  He reports he has had intermittent abdominal pains over the past month and initially felt that it was gas but this pain is sharp and different than any pain he is experienced before.  He reports some associated nausea.  No fevers.  On exam patient is well-appearing with stable vitals.  He does have some CVA tenderness over the left flank.  Mild left lower quadrant abdominal tenderness but no peritoneal signs.  Will get abdominal labs, urinalysis and culture, and CT renal stone study.  Presentation is certainly concerning for kidney stone.  Urine appears infected with positive nitrites, large leukocytes, RBCs, WBCs and rare bacteria, calcium oxalate crystals also present.  Patient with mild leukocytosis, stable hemoglobin.  Creatinine is slightly elevated from baseline, no significant electrolyte derangements aside from glucose of 192.  Normal lipase, normal LFTs.  Urine culture sent.  CT renal study shows a 2 mm stone at the  left UVJ with associated hydronephrosis, and perinephric stranding.  There is also some possible stones within the bladder.  No obstructing stones on the right.  Incidental finding of a 4.6 cm abdominal aortic aneurysm,  recommend vascular follow-up and repeat CT in 6 months.  Evidence of a right hydrocele noted in the right scrotum as well which is palpable and noted on exam, has been present for greater than a year and is nonpainful.  Case discussed with Dr. Jeffie Pollock who reviewed patient's labs and imaging and does recommend admission on hospitalist service for close monitoring, they do not plan on removing stone at this time, but if patient has worsening infectious symptoms, would plan for cystoscopy likely.  Will discuss with hospitalist.  Case discussed with Dr. Roderic Palau with [facility] hospitalist who will see and admit the patient.  Final Clinical Impressions(s) / ED Diagnoses   Final diagnoses:  Kidney stone on left side  Pyelonephritis    ED Discharge Orders    None       Janet Berlin 02/27/19 2217    Lucrezia Starch, MD 02/28/19 [phone]

## 2019-02-27 NOTE — Consult Note (Signed)
Subjective: CC: Left flank pain.  Hx: Mr. Staiger is a 68 yo WM who I was asked to see in consultation by Dr. Roderic Palau for a 61m left UVJ stone with pain and a probable UTI.  He has had severe left flank pain with nausea but no increased in urinary urgency or frequency.  He had some dysuria about a week ago that was transient.  He has chronic nocturia x 3 but no other GU symptoms or history.  He has had no fever or chills.  His pain has improved since arriving in the ED.     ROS:  Review of Systems  Constitutional: Negative for chills and fever.  Cardiovascular: Positive for chest pain (when he has gas pain).  Gastrointestinal: Positive for abdominal pain and nausea.  Genitourinary: Positive for dysuria and flank pain.       NOcturia  All other systems reviewed and are negative.   No Known Allergies  Past Medical History:  Diagnosis Date  . Dilated cardiomyopathy ([facility]     Past Surgical History:  Procedure Laterality Date  . CORONARY ARTERY BYPASS GRAFT N/A 05/13/2018   Procedure: CORONARY ARTERY BYPASS GRAFTING (CABG) times _five__ using left internal mammary artery and right  greater saphenous vein harvested endoscopically.;  Surgeon: HMelrose Nakayama MD;  [place]  Service: Open Heart Surgery;  Laterality: N/A;  . KNEE ARTHROSCOPY    . MITRAL VALVE REPAIR N/A 05/13/2018   Procedure: Mitral Valve Repair;  Surgeon: HMelrose Nakayama MD;  [place]  Service: Open Heart Surgery;  Laterality: N/A;  . NOSE SURGERY    . RIGHT/LEFT HEART CATH AND CORONARY ANGIOGRAPHY N/A 05/05/2018   Procedure: RIGHT/LEFT HEART CATH AND CORONARY ANGIOGRAPHY;  Surgeon: HLeonie Man MD;  [place] [facility];  Service: Cardiovascular;  Laterality: N/A;  . TEE WITHOUT CARDIOVERSION N/A 05/13/2018   Procedure: TRANSESOPHAGEAL ECHOCARDIOGRAM (TEE);  Surgeon: HMelrose Nakayama MD;  [place]  Service: Open Heart Surgery;  Laterality: N/A;  . ULTRASOUND  GUIDANCE FOR VASCULAR ACCESS  05/05/2018   Procedure: Ultrasound Guidance For Vascular Access;  Surgeon: HLeonie Man MD;  [place];  Service: Cardiovascular;;    Social History   Socioeconomic History  . Marital status: Single    Spouse name: Not on file  . Number of children: Not on file  . Years of education: Not on file  . Highest education level: Not on file  Occupational History  . Not on file  Social Needs  . Financial resource strain: Not on file  . Food insecurity    Worry: Not on file    Inability: Not on file  . Transportation needs    Medical: Not on file    Non-medical: Not on file  Tobacco Use  . Smoking status: Former SResearch scientist (life sciences) . Smokeless tobacco: Never Used  Substance and Sexual Activity  . Alcohol use: Never    Frequency: Never  . Drug use: Never  . Sexual activity: Not on file  Lifestyle  . Physical activity    Days per week: Not on file    Minutes per session: Not on file  . Stress: Not on file  Relationships  . Social [place] phone: Not on file    Gets together: Not on file    Attends religious service: Not on file    Active member of club or organization: Not on file    Attends meetings  of clubs or organizations: Not on file    Relationship status: Not on file  . Intimate partner violence    Fear of current or ex partner: Not on file    Emotionally abused: Not on file    Physically abused: Not on file    Forced sexual activity: Not on file  Other Topics Concern  . Not on file  Social History Narrative  . Not on file    Family History  Problem Relation Age of Onset  . CVA Father        died of "cerebral hemorrhage"  . CAD Brother        died of "heart attack" at age 88    Anti-infectives: Anti-infectives (From admission, onward)   Start     Dose/Rate Route Frequency Ordered Stop   02/27/19 1300  cefTRIAXone (ROCEPHIN) 1 g in sodium chloride 0.9 % 100 mL IVPB     1 g 200 mL/hr over 30 Minutes  Intravenous  Once 02/27/19 1246 02/27/19 1335      No current facility-administered medications for this encounter.    Current Outpatient Medications  Medication Sig Dispense Refill  . aspirin EC 81 MG EC tablet Take 1 tablet (81 mg total) by mouth daily.    Marland Kitchen atorvastatin (LIPITOR) 80 MG tablet TAKE 1 TABLET BY MOUTH EVERY DAY AT 6 PM (Patient taking differently: Take 80 mg by mouth daily at 6 PM. ) 30 tablet 1  . carvedilol (COREG) 25 MG tablet Take 1 tablet (25 mg total) by mouth 2 (two) times daily. 180 tablet 3  . sacubitril-valsartan (ENTRESTO) 24-26 MG Take 1 tablet by mouth 2 (two) times daily.    . Blood Glucose Monitoring Suppl (ONE TOUCH ULTRA 2) w/Device KIT     . Lancets (ONETOUCH DELICA PLUS [id number]) MISC     . lisinopril (ZESTRIL) 5 MG tablet Take 1 tablet (5 mg total) by mouth daily. 90 tablet 0  . ONETOUCH ULTRA test strip     . pantoprazole (PROTONIX) 20 MG tablet Take 1 tablet (20 mg total) by mouth daily. 90 tablet 3     Objective: Vital signs in last 24 hours: Temp:  [97.4 F (36.3 C)-97.6 F (36.4 C)] 97.4 F (36.3 C) (09/07 1432) Pulse Rate:  [69-78] 74 (09/07 1700) Resp:  [14-22] 17 (09/07 1647) BP: (105-173)/(63-101) 113/71 (09/07 1700) SpO2:  [96 %-100 %] 97 % (09/07 1700)  Intake/Output from previous day: No intake/output data recorded. Intake/Output this shift: Total I/O In: 100.5 [IV Piggyback:100.5] Out: -    Physical Exam Vitals signs reviewed.  Constitutional:      Appearance: Normal appearance. He is well-developed. He is not ill-appearing.  HENT:     Head: Normocephalic and atraumatic.  Neck:     Musculoskeletal: Normal range of motion and neck supple.  Cardiovascular:     Rate and Rhythm: Normal rate and regular rhythm.     Heart sounds: Normal heart sounds.  Pulmonary:     Effort: Pulmonary effort is normal. No respiratory distress.     Breath sounds: Normal breath sounds.  Abdominal:     General: Abdomen is flat.      Palpations: Abdomen is soft.     Tenderness: There is abdominal tenderness (mild LLQ). There is no right CVA tenderness or left CVA tenderness.  Musculoskeletal: Normal range of motion.        General: No swelling or tenderness.  Skin:    General: Skin is warm and dry.  Neurological:     General: No focal deficit present.     Mental Status: He is alert and oriented to person, place, and time.  Psychiatric:        Mood and Affect: Mood normal.        Behavior: Behavior normal.     Lab Results:  Recent Labs    02/27/19 1050  WBC 11.1*  HGB 14.9  HCT 43.9  PLT 224   BMET Recent Labs    02/27/19 1050  NA 139  K 4.4  CL 104  CO2 28  GLUCOSE 192*  BUN 10  CREATININE 1.06  CALCIUM 8.8*   PT/INR No results for input(s): LABPROT, INR in the last 72 hours. ABG No results for input(s): PHART, HCO3 in the last 72 hours.  Invalid input(s): PCO2, PO2  Studies/Results: Ct Renal Stone Study  Result Date: 02/27/2019 CLINICAL DATA:  Standard/full stone LLQ/left flank pain x 2days, no prior abd surgery EXAM: CT ABDOMEN AND PELVIS WITHOUT CONTRAST TECHNIQUE: Multidetector CT imaging of the abdomen and pelvis was performed following the standard protocol without IV contrast. COMPARISON:  Chest x-ray 07/12/2018 FINDINGS: Lower chest: Emphysematous changes are seen at the lung bases. No focal consolidations or pleural effusions. Elevation of the LEFT hemidiaphragm is stable. Previous mitral annuloplasty. Heart size is normal. No pericardial effusion. Coronary artery calcifications. Hepatobiliary: Faintly radiopaque gallstone, gallstones, or sludge. The liver is homogeneous without focal abnormality. Pancreas: Unremarkable. No pancreatic ductal dilatation or surrounding inflammatory changes. Spleen: Normal in size without focal abnormality. Adrenals/Urinary Tract: The adrenal glands are normal in appearance. There is LEFT-sided hydronephrosis. Perinephric stranding identified on the LEFT. A  nonobstructing calculus is identified in the LOWER pole the LEFT kidney measuring 2 millimeters. The LEFT ureter is dilated and mildly tortuous to level of the ureterovesical junction where there is a 2 millimeter calculus. The RIGHT kidney and ureter are unremarkable. The urinary bladder is notable for layering high attenuation material, suspicious for small stones. Alternatively, the patient may have had a recent contrast administration with residual contrast in the bladder. Stomach/Bowel: The stomach is distended and contains layering debris and fluid. Small bowel loops are normal in caliber. There are extensive colonic diverticula. No associated inflammatory changes or obstruction. Moderate stool burden. The appendix is well seen and has a normal appearance. Vascular/Lymphatic: There is atherosclerotic calcification of the abdominal aorta. Infrarenal abdominal aortic aneurysm is 4.6 x 4.4 centimeters. No retroperitoneal or mesenteric adenopathy. Reproductive: There is dense calcification of the prostate gland. Suspect a hydrocele the RIGHT hemiscrotum. Other: No ascites. Musculoskeletal: No acute or significant osseous findings. IMPRESSION: 1. Obstructing 2 millimeter calculus at the left ureterovesical junction. LEFT hydronephrosis and perinephric stranding. 2. Nonobstructing 2 mm calculus in the LOWER pole of the LEFT kidney. 3. Layering high attenuation material within the urinary bladder, suspicious for small stones or recent contrast administration. 4. Infrarenal abdominal aortic aneurysm measuring 4.6 cm. Recommend followup by abdomen and pelvis CTA in 6 months, and vascular surgery referral/consultation if not already obtained. This recommendation follows ACR consensus guidelines: White Paper of the ACR Incidental Findings Committee II on Vascular Findings. J Am Coll Radiol 2013; 10:789-794. Aortic aneurysm NOS (ICD10-I71.9) 5. Faintly radiopaque gallstone, gallstones, or sludge. 6. Coronary artery  disease. Status post mitral annuloplasty. 7. Colonic diverticulosis. 8. Moderate stool burden. 9. Suspect hydrocele the RIGHT hemiscrotum. 10. Aortic Atherosclerosis (ICD10-I70.0) and Emphysema (ICD10-J43.9). Electronically Signed   By: Nolon Nations M.D.   On: 02/27/2019 12:42  Assessment: 55m LUVJ stone with mild obstruction and possible UTI.  He has no signs or symptoms of sepsis and his pain has improved.   I have recommended overnight observation with antibiotic therapy.   I will reassess in the morning but this stone has a very high probability of spontaneous passage and I don't think his condition merits a stent at this time.    I will add tamsulosin for the stone and LUTS.  He will strain his urine.    CC: Dr. JKathie Dike    JIrine Seal9/12/2018 [phone]

## 2019-02-27 NOTE — ED Notes (Signed)
ED TO INPATIENT HANDOFF REPORT  ED Nurse Name and Phone #: Nadya Hopwood/Kayla  S Name/Age/Gender Zoila Shutter 68 y.o. male Room/Bed: 056C/056C  Code Status   Code Status: Prior  Home/SNF/Other Home Patient oriented to: self, place, time and situation Is this baseline? Yes   Triage Complete: Triage complete  Chief Complaint abd pain  Triage Note Pt endorses LLQ radiating to left flank x 1 month, has hx of gas and has been taking medication without relief. VSS.    Allergies No Known Allergies  Level of Care/Admitting Diagnosis ED Disposition    ED Disposition Condition Comment   Admit  Hospital Area: [facility] [[id number]]  Level of Care: Med-Surg [16]  I expect the patient will be discharged within 24 hours: Yes  LOW acuity---Tx typically complete <24 hrs---ACUTE conditions typically can be evaluated <24 hours---LABS likely to return to acceptable levels <24 hours---IS near functional baseline---EXPECTED to return to current living arrangement---NOT newly hypoxic: Does not meet criteria for 5C-Observation unit  Covid Evaluation: Asymptomatic Screening Protocol (No Symptoms)  Diagnosis: Ureteral colic [962229]  Admitting Physician: Erick Blinks [[id number]]  Attending Physician: Erick Blinks [[id number]]  PT Class (Do Not Modify): Observation [104]  PT Acc Code (Do Not Modify): Observation [10022]       B Medical/Surgery History Past Medical History:  Diagnosis Date  . Dilated cardiomyopathy [facility])    Past Surgical History:  Procedure Laterality Date  . CORONARY ARTERY BYPASS GRAFT N/A 05/13/2018   Procedure: CORONARY ARTERY BYPASS GRAFTING (CABG) times _five__ using left internal mammary artery and right  greater saphenous vein harvested endoscopically.;  Surgeon: Loreli Slot, MD;  Location: [facility] OR;  Service: Open Heart Surgery;  Laterality: N/A;  . KNEE ARTHROSCOPY    . MITRAL VALVE REPAIR N/A 05/13/2018   Procedure: Mitral Valve Repair;   Surgeon: Loreli Slot, MD;  Location: [facility] OR;  Service: Open Heart Surgery;  Laterality: N/A;  . NOSE SURGERY    . RIGHT/LEFT HEART CATH AND CORONARY ANGIOGRAPHY N/A 05/05/2018   Procedure: RIGHT/LEFT HEART CATH AND CORONARY ANGIOGRAPHY;  Surgeon: Marykay Lex, MD;  Location: [facility] INVASIVE CV LAB;  Service: Cardiovascular;  Laterality: N/A;  . TEE WITHOUT CARDIOVERSION N/A 05/13/2018   Procedure: TRANSESOPHAGEAL ECHOCARDIOGRAM (TEE);  Surgeon: Loreli Slot, MD;  Location: [facility] OR;  Service: Open Heart Surgery;  Laterality: N/A;  . ULTRASOUND GUIDANCE FOR VASCULAR ACCESS  05/05/2018   Procedure: Ultrasound Guidance For Vascular Access;  Surgeon: Marykay Lex, MD;  Location: [facility] INVASIVE CV LAB;  Service: Cardiovascular;;     A IV Location/Drains/Wounds Patient Lines/Drains/Airways Status   Active Line/Drains/Airways    Name:   Placement date:   Placement time:   Site:   Days:   Peripheral IV 02/27/19 Right Antecubital   02/27/19    1204    Antecubital   less than 1   Incision (Closed) 05/13/18 Chest Other (Comment)   05/13/18    1039     290   Incision (Closed) 05/13/18 Leg Right   05/13/18    1039     290          Intake/Output Last 24 hours  Intake/Output Summary (Last 24 hours) at 02/27/2019 1704 Last data filed at 02/27/2019 1335 Gross per 24 hour  Intake 100.46 ml  Output -  Net 100.46 ml    Labs/Imaging Results for orders placed or performed during the hospital encounter of 02/27/19 (from the past 48 hour(s))  Urinalysis, Routine w  reflex microscopic     Status: Abnormal   Collection Time: 02/27/19 10:49 AM  Result Value Ref Range   Color, Urine YELLOW YELLOW   APPearance CLOUDY (A) CLEAR   Specific Gravity, Urine 1.013 1.005 - 1.030   pH 5.0 5.0 - 8.0   Glucose, UA 50 (A) NEGATIVE mg/dL   Hgb urine dipstick LARGE (A) NEGATIVE   Bilirubin Urine NEGATIVE NEGATIVE   Ketones, ur NEGATIVE NEGATIVE mg/dL   Protein, ur 30 (A) NEGATIVE mg/dL   Nitrite  POSITIVE (A) NEGATIVE   Leukocytes,Ua LARGE (A) NEGATIVE   RBC / HPF 21-50 0 - 5 RBC/hpf   WBC, UA 21-50 0 - 5 WBC/hpf   Bacteria, UA RARE (A) NONE SEEN   Squamous Epithelial / LPF 0-5 0 - 5   Mucus PRESENT    Ca Oxalate Crys, UA PRESENT     Comment: Performed at [facility], [address] [place], [place] [address]  Lipase, blood     Status: None   Collection Time: 02/27/19 10:50 AM  Result Value Ref Range   Lipase 40 11 - 51 U/L    Comment: Performed at [facility], [address] [place], [place] [address]  Comprehensive metabolic panel     Status: Abnormal   Collection Time: 02/27/19 10:50 AM  Result Value Ref Range   Sodium 139 135 - 145 mmol/L   Potassium 4.4 3.5 - 5.1 mmol/L   Chloride 104 98 - 111 mmol/L   CO2 28 22 - 32 mmol/L   Glucose, Bld 192 (H) 70 - 99 mg/dL   BUN 10 8 - 23 mg/dL   Creatinine, Ser 0.981.06 0.61 - 1.24 mg/dL   Calcium 8.8 (L) 8.9 - 10.3 mg/dL   Total Protein 7.2 6.5 - 8.1 g/dL   Albumin 4.1 3.5 - 5.0 g/dL   AST 26 15 - 41 U/L   ALT 28 0 - 44 U/L   Alkaline Phosphatase 59 38 - 126 U/L   Total Bilirubin 1.1 0.3 - 1.2 mg/dL   GFR calc non Af Amer >60 >60 mL/min   GFR calc Af Amer >60 >60 mL/min   Anion gap 7 5 - 15    Comment: Performed at [facility], [address] [place], [place] [address]  CBC     Status: Abnormal   Collection Time: 02/27/19 10:50 AM  Result Value Ref Range   WBC 11.1 (H) 4.0 - 10.5 K/uL   RBC 4.82 4.22 - 5.81 MIL/uL   Hemoglobin 14.9 13.0 - 17.0 g/dL   HCT 78.243.9 95.639.0 - 21.352.0 %   MCV 91.1 80.0 - 100.0 fL   MCH 30.9 26.0 - 34.0 pg   MCHC 33.9 30.0 - 36.0 g/dL   RDW 08.612.6 57.811.5 - 46.915.5 %   Platelets 224 150 - 400 K/uL   nRBC 0.0 0.0 - 0.2 %    Comment: Performed at [facility], [address] [place], [place] [address]   Ct Renal Stone Study  Result Date: 02/27/2019 CLINICAL DATA:  Standard/full stone LLQ/left flank pain x 2days, no prior abd surgery EXAM: CT ABDOMEN AND PELVIS WITHOUT CONTRAST  TECHNIQUE: Multidetector CT imaging of the abdomen and pelvis was performed following the standard protocol without IV contrast. COMPARISON:  Chest x-ray 07/12/2018 FINDINGS: Lower chest: Emphysematous changes are seen at the lung bases. No focal consolidations or pleural effusions. Elevation of the LEFT hemidiaphragm is stable. Previous mitral annuloplasty. Heart size is normal.  No pericardial effusion. Coronary artery calcifications. Hepatobiliary: Faintly radiopaque gallstone, gallstones, or sludge. The liver is homogeneous without focal abnormality. Pancreas: Unremarkable. No pancreatic ductal dilatation or surrounding inflammatory changes. Spleen: Normal in size without focal abnormality. Adrenals/Urinary Tract: The adrenal glands are normal in appearance. There is LEFT-sided hydronephrosis. Perinephric stranding identified on the LEFT. A nonobstructing calculus is identified in the LOWER pole the LEFT kidney measuring 2 millimeters. The LEFT ureter is dilated and mildly tortuous to level of the ureterovesical junction where there is a 2 millimeter calculus. The RIGHT kidney and ureter are unremarkable. The urinary bladder is notable for layering high attenuation material, suspicious for small stones. Alternatively, the patient may have had a recent contrast administration with residual contrast in the bladder. Stomach/Bowel: The stomach is distended and contains layering debris and fluid. Small bowel loops are normal in caliber. There are extensive colonic diverticula. No associated inflammatory changes or obstruction. Moderate stool burden. The appendix is well seen and has a normal appearance. Vascular/Lymphatic: There is atherosclerotic calcification of the abdominal aorta. Infrarenal abdominal aortic aneurysm is 4.6 x 4.4 centimeters. No retroperitoneal or mesenteric adenopathy. Reproductive: There is dense calcification of the prostate gland. Suspect a hydrocele the RIGHT hemiscrotum. Other: No ascites.  Musculoskeletal: No acute or significant osseous findings. IMPRESSION: 1. Obstructing 2 millimeter calculus at the left ureterovesical junction. LEFT hydronephrosis and perinephric stranding. 2. Nonobstructing 2 mm calculus in the LOWER pole of the LEFT kidney. 3. Layering high attenuation material within the urinary bladder, suspicious for small stones or recent contrast administration. 4. Infrarenal abdominal aortic aneurysm measuring 4.6 cm. Recommend followup by abdomen and pelvis CTA in 6 months, and vascular surgery referral/consultation if not already obtained. This recommendation follows ACR consensus guidelines: White Paper of the ACR Incidental Findings Committee II on Vascular Findings. J Am Coll Radiol 2013; 10:789-794. Aortic aneurysm NOS (ICD10-I71.9) 5. Faintly radiopaque gallstone, gallstones, or sludge. 6. Coronary artery disease. Status post mitral annuloplasty. 7. Colonic diverticulosis. 8. Moderate stool burden. 9. Suspect hydrocele the RIGHT hemiscrotum. 10. Aortic Atherosclerosis (ICD10-I70.0) and Emphysema (ICD10-J43.9). Electronically Signed   By: Nolon Nations M.D.   On: 02/27/2019 12:42    Pending Labs Unresulted Labs (From admission, onward)    Start     Ordered   02/27/19 1517  SARS CORONAVIRUS 2 (TAT 6-24 HRS) Nasopharyngeal Nasopharyngeal Swab  (Asymptomatic/Tier 2 Patients Labs)  Once,   STAT    Question Answer Comment  Is this test for diagnosis or screening Screening   Symptomatic for COVID-19 as defined by CDC No   Hospitalized for COVID-19 No   Admitted to ICU for COVID-19 No   Previously tested for COVID-19 No   Resident in a congregate (group) care setting No   Employed in healthcare setting No      02/27/19 1516   02/27/19 1250  Urine culture  Add-on,   AD     02/27/19 1249   Signed and Held  CBC  (heparin)  Once,   R    Comments: Baseline for heparin therapy IF NOT ALREADY DRAWN.  Notify MD if PLT < 100 K.    Signed and Held   Signed and Held   Creatinine, serum  (heparin)  Once,   R    Comments: Baseline for heparin therapy IF NOT ALREADY DRAWN.    Signed and Held   Signed and Held  Comprehensive metabolic panel  Tomorrow morning,   R     Signed and Held   Signed and  Held  CBC  Tomorrow morning,   R     Signed and Held          Vitals/Pain Today's Vitals   02/27/19 1600 02/27/19 1615 02/27/19 1647 02/27/19 1700  BP: 122/69 114/66 117/73 113/71  Pulse: 73 72 74 74  Resp: 18 15 17    Temp:      TempSrc:      SpO2: 96% 98%  97%  PainSc:        Isolation Precautions No active isolations  Medications Medications  morphine 4 MG/ML injection 4 mg (4 mg Intravenous Given 02/27/19 1217)  ondansetron (ZOFRAN) injection 4 mg (4 mg Intravenous Given 02/27/19 1216)  cefTRIAXone (ROCEPHIN) 1 g in sodium chloride 0.9 % 100 mL IVPB (0 g Intravenous Stopped 02/27/19 1335)    Mobility walks     Focused Assessments Renal Assessment Handoff: Kidney stone (watching kidney function)      R Recommendations: See Admitting Provider Note  Report given to:   Additional Notes:

## 2019-02-27 NOTE — ED Notes (Signed)
Gave pt.a stainer to use when urianation  To catch stone if pass.

## 2019-02-27 NOTE — Plan of Care (Signed)
  Problem: Pain Managment: Goal: General experience of comfort will improve Outcome: Progressing   Problem: Safety: Goal: Ability to remain free from injury will improve Outcome: Progressing   Problem: Skin Integrity: Goal: Risk for impaired skin integrity will decrease Outcome: Progressing   

## 2019-02-28 ENCOUNTER — Encounter (HOSPITAL_COMMUNITY): Payer: Self-pay | Admitting: Internal Medicine

## 2019-02-28 DIAGNOSIS — R739 Hyperglycemia, unspecified: Secondary | ICD-10-CM | POA: Diagnosis present

## 2019-02-28 DIAGNOSIS — R1032 Left lower quadrant pain: Secondary | ICD-10-CM | POA: Diagnosis not present

## 2019-02-28 DIAGNOSIS — Z951 Presence of aortocoronary bypass graft: Secondary | ICD-10-CM | POA: Diagnosis not present

## 2019-02-28 DIAGNOSIS — I714 Abdominal aortic aneurysm, without rupture: Secondary | ICD-10-CM | POA: Diagnosis not present

## 2019-02-28 DIAGNOSIS — N200 Calculus of kidney: Secondary | ICD-10-CM | POA: Diagnosis not present

## 2019-02-28 DIAGNOSIS — I25810 Atherosclerosis of coronary artery bypass graft(s) without angina pectoris: Secondary | ICD-10-CM | POA: Diagnosis not present

## 2019-02-28 DIAGNOSIS — I255 Ischemic cardiomyopathy: Secondary | ICD-10-CM | POA: Diagnosis not present

## 2019-02-28 DIAGNOSIS — Z8249 Family history of ischemic heart disease and other diseases of the circulatory system: Secondary | ICD-10-CM | POA: Diagnosis not present

## 2019-02-28 DIAGNOSIS — Z7982 Long term (current) use of aspirin: Secondary | ICD-10-CM | POA: Diagnosis not present

## 2019-02-28 DIAGNOSIS — N136 Pyonephrosis: Secondary | ICD-10-CM | POA: Diagnosis not present

## 2019-02-28 DIAGNOSIS — Z20828 Contact with and (suspected) exposure to other viral communicable diseases: Secondary | ICD-10-CM | POA: Diagnosis not present

## 2019-02-28 DIAGNOSIS — Z79899 Other long term (current) drug therapy: Secondary | ICD-10-CM | POA: Diagnosis not present

## 2019-02-28 DIAGNOSIS — I110 Hypertensive heart disease with heart failure: Secondary | ICD-10-CM | POA: Diagnosis not present

## 2019-02-28 DIAGNOSIS — I5022 Chronic systolic (congestive) heart failure: Secondary | ICD-10-CM | POA: Diagnosis not present

## 2019-02-28 DIAGNOSIS — E785 Hyperlipidemia, unspecified: Secondary | ICD-10-CM | POA: Diagnosis not present

## 2019-02-28 DIAGNOSIS — Z823 Family history of stroke: Secondary | ICD-10-CM | POA: Diagnosis not present

## 2019-02-28 DIAGNOSIS — N12 Tubulo-interstitial nephritis, not specified as acute or chronic: Secondary | ICD-10-CM | POA: Diagnosis not present

## 2019-02-28 DIAGNOSIS — Z87891 Personal history of nicotine dependence: Secondary | ICD-10-CM | POA: Diagnosis not present

## 2019-02-28 DIAGNOSIS — N202 Calculus of kidney with calculus of ureter: Secondary | ICD-10-CM | POA: Diagnosis not present

## 2019-02-28 DIAGNOSIS — N138 Other obstructive and reflux uropathy: Secondary | ICD-10-CM | POA: Diagnosis not present

## 2019-02-28 DIAGNOSIS — N132 Hydronephrosis with renal and ureteral calculous obstruction: Secondary | ICD-10-CM | POA: Diagnosis not present

## 2019-02-28 LAB — CBC
HCT: 38.3 % — ABNORMAL LOW (ref 39.0–52.0)
Hemoglobin: 13 g/dL (ref 13.0–17.0)
MCH: 31 pg (ref 26.0–34.0)
MCHC: 33.9 g/dL (ref 30.0–36.0)
MCV: 91.2 fL (ref 80.0–100.0)
Platelets: 185 10*3/uL (ref 150–400)
RBC: 4.2 MIL/uL — ABNORMAL LOW (ref 4.22–5.81)
RDW: 12.8 % (ref 11.5–15.5)
WBC: 7.8 10*3/uL (ref 4.0–10.5)
nRBC: 0 % (ref 0.0–0.2)

## 2019-02-28 LAB — COMPREHENSIVE METABOLIC PANEL
ALT: 20 U/L (ref 0–44)
AST: 22 U/L (ref 15–41)
Albumin: 3 g/dL — ABNORMAL LOW (ref 3.5–5.0)
Alkaline Phosphatase: 48 U/L (ref 38–126)
Anion gap: 6 (ref 5–15)
BUN: 10 mg/dL (ref 8–23)
CO2: 27 mmol/L (ref 22–32)
Calcium: 8.4 mg/dL — ABNORMAL LOW (ref 8.9–10.3)
Chloride: 104 mmol/L (ref 98–111)
Creatinine, Ser: 1.09 mg/dL (ref 0.61–1.24)
GFR calc Af Amer: >60 mL/min (ref >60)
GFR calc non Af Amer: >60 mL/min (ref >60)
Glucose, Bld: 180 mg/dL — ABNORMAL HIGH (ref 70–99)
Potassium: 4.2 mmol/L (ref 3.5–5.1)
Sodium: 137 mmol/L (ref 135–145)
Total Bilirubin: 0.8 mg/dL (ref 0.3–1.2)
Total Protein: 5.7 g/dL — ABNORMAL LOW (ref 6.5–8.1)

## 2019-02-28 LAB — GLUCOSE, CAPILLARY
Glucose-Capillary: 179 mg/dL — ABNORMAL HIGH (ref 70–99)
Glucose-Capillary: 164 mg/dL — ABNORMAL HIGH (ref 70–99)
Glucose-Capillary: 186 mg/dL — ABNORMAL HIGH (ref 70–99)

## 2019-02-28 LAB — SARS CORONAVIRUS 2 (TAT 6-24 HRS): SARS Coronavirus 2: NEGATIVE

## 2019-02-28 MED ORDER — INSULIN ASPART 100 UNIT/ML ~~LOC~~ SOLN
0.0000 [IU] | Freq: Three times a day (TID) | SUBCUTANEOUS | Status: DC
  Administered 2019-02-28 – 2019-03-01 (×3): 2 [IU] via SUBCUTANEOUS

## 2019-02-28 NOTE — Progress Notes (Signed)
[facility] PROGRESS NOTE  Michael Banks OTR:[id number] DOB: 25-Feb-1951 DOA: 02/27/2019 PCP: Lorenda Ishihara, MD  Assessment/Plan: 1. Left-sided kidney stone with ureteral colic and left-sided hydronephrosis. 83mm LUVJ stone. No pain this am. Provided iv fluids and pain management. Evaluated by urology who opined stone has very high prbability of spontanious passage. Recommends discharge with antibiotics once urine culture back.  2. Urinary tract infection.  Urinalysis indicates possible infection.  Started on Rocephin. Urine culture sent this am. Continue rocephin. Likely discharge tomorrow 3. Chronic systolic congestive heart failure. Remains compensated at this time.  Continue on Entresto, beta-blockers.  He does not appear to be on chronic diuretics. 4. Coronary artery status post CABG.  No complaints of chest pain.  Continue aspirin, statin, beta-blocker 5. Abdominal aortic aneurysm.  Incidental finding on CT imaging.  Will need repeat imaging in 6 months with CT scan as well as outpatient vascular surgery referral. 6. Hyperlipidemia.  Continue statin 7. Hypertension. BP on soft side this am. Aymptomatic.  Home meds include entresto and lisinopril and coreg. Holding lisinopril, continue entresto, continue coreg with parameters. Monitor closely 8. Hyperglycemia. Serum glucose 180. Hx of same. Not on any meds. Will obtain HgA1c and use SSI for optimal control   Code Status: full Family Communication: patient Disposition Plan: home tomorrow   Consultants:  [facility]  Procedures:    Antibiotics:  Rocephin 9/7>>  HPI/Subjective: Ambulating in room with steady gait. Denies pain/discomfort  Objective: Vitals:   02/28/19 0503 02/28/19 1037  BP: (!) 93/56 130/67  Pulse: 60   Resp: 18   Temp: 97.6 F (36.4 C)   SpO2: 97%     Intake/Output Summary (Last 24 hours) at 02/28/2019 1138 Last data filed at 02/28/2019 1126 Gross per 24 hour  Intake 1253.04 ml   Output 1220 ml  Net 33.04 ml   There were no vitals filed for this visit.  Exam:   General:  Awake alert no acute distress  Cardiovascular: rrr no mgr no LE edema  Respiratory: normal effort BS clear bilaterally no wheeze  Abdomen: non-distended non-tender +BS   Musculoskeletal: joints without swelling/erythema   Data Reviewed: Basic Metabolic Panel: Recent Labs  Lab 02/27/19 1050 02/28/19 0241  NA 139 137  K 4.4 4.2  CL 104 104  CO2 28 27  GLUCOSE 192* 180*  BUN 10 10  CREATININE 1.06 1.09  CALCIUM 8.8* 8.4*   Liver Function Tests: Recent Labs  Lab 02/27/19 1050 02/28/19 0241  AST 26 22  ALT 28 20  ALKPHOS 59 48  BILITOT 1.1 0.8  PROT 7.2 5.7*  ALBUMIN 4.1 3.0*   Recent Labs  Lab 02/27/19 1050  LIPASE 40   No results for input(s): AMMONIA in the last 168 hours. CBC: Recent Labs  Lab 02/27/19 1050 02/28/19 0241  WBC 11.1* 7.8  HGB 14.9 13.0  HCT 43.9 38.3*  MCV 91.1 91.2  PLT 224 185   Cardiac Enzymes: No results for input(s): CKTOTAL, CKMB, CKMBINDEX, TROPONINI in the last 168 hours. BNP (last 3 results) Recent Labs    05/03/18 1324  BNP 2,703.3*    ProBNP (last 3 results) No results for input(s): PROBNP in the last 8760 hours.  CBG: No results for input(s): GLUCAP in the last 168 hours.  Recent Results (from the past 240 hour(s))  SARS CORONAVIRUS 2 (TAT 6-24 HRS) Nasopharyngeal Nasopharyngeal Swab     Status: None   Collection Time: 02/27/19  3:17 PM   Specimen: Nasopharyngeal Swab  Result  Value Ref Range Status   SARS Coronavirus 2 NEGATIVE NEGATIVE Final    Comment: (NOTE) SARS-CoV-2 target nucleic acids are NOT DETECTED. The SARS-CoV-2 RNA is generally detectable in upper and lower respiratory specimens during the acute phase of infection. Negative results do not preclude SARS-CoV-2 infection, do not rule out co-infections with other pathogens, and should not be used as the sole basis for treatment or other patient  management decisions. Negative results must be combined with clinical observations, patient history, and epidemiological information. The expected result is Negative. Fact Sheet for Patients: HairSlick.nohttps://www.fda.gov/media/138098/download Fact Sheet for Healthcare Providers: quierodirigir.comhttps://www.fda.gov/media/138095/download This test is not yet approved or cleared by the [place] FDA and  has been authorized for detection and/or diagnosis of SARS-CoV-2 by FDA under an Emergency Use Authorization (EUA). This EUA will remain  in effect (meaning this test can be used) for the duration of the COVID-19 declaration under Section 56 4(b)(1) of the Act, 21 U.S.C. section 360bbb-3(b)(1), unless the authorization is terminated or revoked sooner. Performed at [facility], [address] [place], [place] [address]      Studies: Ct Renal Stone Study  Result Date: 02/27/2019 CLINICAL DATA:  Standard/full stone LLQ/left flank pain x 2days, no prior abd surgery EXAM: CT ABDOMEN AND PELVIS WITHOUT CONTRAST TECHNIQUE: Multidetector CT imaging of the abdomen and pelvis was performed following the standard protocol without IV contrast. COMPARISON:  Chest x-ray 07/12/2018 FINDINGS: Lower chest: Emphysematous changes are seen at the lung bases. No focal consolidations or pleural effusions. Elevation of the LEFT hemidiaphragm is stable. Previous mitral annuloplasty. Heart size is normal. No pericardial effusion. Coronary artery calcifications. Hepatobiliary: Faintly radiopaque gallstone, gallstones, or sludge. The liver is homogeneous without focal abnormality. Pancreas: Unremarkable. No pancreatic ductal dilatation or surrounding inflammatory changes. Spleen: Normal in size without focal abnormality. Adrenals/Urinary Tract: The adrenal glands are normal in appearance. There is LEFT-sided hydronephrosis. Perinephric stranding identified on the LEFT. A nonobstructing calculus is identified in the LOWER pole the LEFT  kidney measuring 2 millimeters. The LEFT ureter is dilated and mildly tortuous to level of the ureterovesical junction where there is a 2 millimeter calculus. The RIGHT kidney and ureter are unremarkable. The urinary bladder is notable for layering high attenuation material, suspicious for small stones. Alternatively, the patient may have had a recent contrast administration with residual contrast in the bladder. Stomach/Bowel: The stomach is distended and contains layering debris and fluid. Small bowel loops are normal in caliber. There are extensive colonic diverticula. No associated inflammatory changes or obstruction. Moderate stool burden. The appendix is well seen and has a normal appearance. Vascular/Lymphatic: There is atherosclerotic calcification of the abdominal aorta. Infrarenal abdominal aortic aneurysm is 4.6 x 4.4 centimeters. No retroperitoneal or mesenteric adenopathy. Reproductive: There is dense calcification of the prostate gland. Suspect a hydrocele the RIGHT hemiscrotum. Other: No ascites. Musculoskeletal: No acute or significant osseous findings. IMPRESSION: 1. Obstructing 2 millimeter calculus at the left ureterovesical junction. LEFT hydronephrosis and perinephric stranding. 2. Nonobstructing 2 mm calculus in the LOWER pole of the LEFT kidney. 3. Layering high attenuation material within the urinary bladder, suspicious for small stones or recent contrast administration. 4. Infrarenal abdominal aortic aneurysm measuring 4.6 cm. Recommend followup by abdomen and pelvis CTA in 6 months, and vascular surgery referral/consultation if not already obtained. This recommendation follows ACR consensus guidelines: White Paper of the ACR Incidental Findings Committee II on Vascular Findings. J Am Coll Radiol 2013; 10:789-794. Aortic aneurysm NOS (ICD10-I71.9) 5. Faintly radiopaque gallstone,  gallstones, or sludge. 6. Coronary artery disease. Status post mitral annuloplasty. 7. Colonic diverticulosis. 8.  Moderate stool burden. 9. Suspect hydrocele the RIGHT hemiscrotum. 10. Aortic Atherosclerosis (ICD10-I70.0) and Emphysema (ICD10-J43.9). Electronically Signed   By: Nolon Nations M.D.   On: 02/27/2019 12:42    Scheduled Meds: . aspirin EC  81 mg Oral Daily  . atorvastatin  80 mg Oral q1800  . carvedilol  25 mg Oral BID  . heparin  5,000 Units Subcutaneous Q8H  . pantoprazole  20 mg Oral Daily  . sacubitril-valsartan  1 tablet Oral BID  . tamsulosin  0.4 mg Oral Daily   Continuous Infusions: . cefTRIAXone (ROCEPHIN)  IV Stopped (02/27/19 2120)    Principal Problem:   Urinary tract obstruction by kidney stone Active Problems:   Hydronephrosis of left kidney   Acute lower UTI   Ischemic cardiomyopathy   Chronic systolic heart failure (HCC)   Essential hypertension   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   Hyperlipidemia   Ureteral colic   Abdominal aortic aneurysm (AAA) ([place])    Time spent: 30 minutes    [place] NP [facility]  If 7PM-7AM, please contact night-coverage at [email or web address], password [facility] 02/28/2019, 11:38 AM  LOS: 0 days

## 2019-02-28 NOTE — Progress Notes (Signed)
Subjective: Michael Banks has a 2mm LUVJ stone with possible UTI  is doing well this morning without further pain.  He has no fever or voiding symptoms.  He has not seen the stone pass.  He has no associated signs or symptoms.  ROS:  Review of Systems  Constitutional: Negative for fever.  Gastrointestinal: Negative for nausea.  Genitourinary: Negative for flank pain.  Neurological: Negative for dizziness.    Anti-infectives: Anti-infectives (From admission, onward)   Start     Dose/Rate Route Frequency Ordered Stop   02/27/19 1745  cefTRIAXone (ROCEPHIN) 1 g in sodium chloride 0.9 % 100 mL IVPB     1 g 200 mL/hr over 30 Minutes Intravenous Every 24 hours 02/27/19 1736     02/27/19 1300  cefTRIAXone (ROCEPHIN) 1 g in sodium chloride 0.9 % 100 mL IVPB     1 g 200 mL/hr over 30 Minutes Intravenous  Once 02/27/19 1246 02/27/19 1335      Current Facility-Administered Medications  Medication Dose Route Frequency Provider Last Rate Last Dose  . acetaminophen (TYLENOL) tablet 650 mg  650 mg Oral Q6H PRN Erick BlinksMemon, Jehanzeb, MD       Or  . acetaminophen (TYLENOL) suppository 650 mg  650 mg Rectal Q6H PRN Erick BlinksMemon, Jehanzeb, MD      . aspirin EC tablet 81 mg  81 mg Oral Daily Memon, Durward MallardJehanzeb, MD      . atorvastatin (LIPITOR) tablet 80 mg  80 mg Oral q1800 Erick BlinksMemon, Jehanzeb, MD   80 mg at 02/27/19 1904  . carvedilol (COREG) tablet 25 mg  25 mg Oral BID Erick BlinksMemon, Jehanzeb, MD   25 mg at 02/27/19 2047  . cefTRIAXone (ROCEPHIN) 1 g in sodium chloride 0.9 % 100 mL IVPB  1 g Intravenous Q24H Erick BlinksMemon, Jehanzeb, MD   Stopped at 02/27/19 2120  . heparin injection 5,000 Units  5,000 Units Subcutaneous Q8H Erick BlinksMemon, Jehanzeb, MD   5,000 Units at 02/28/19 0500  . HYDROcodone-acetaminophen (NORCO/VICODIN) 5-325 MG per tablet 1-2 tablet  1-2 tablet Oral Q4H PRN Erick BlinksMemon, Jehanzeb, MD      . ondansetron (ZOFRAN) tablet 4 mg  4 mg Oral Q6H PRN Erick BlinksMemon, Jehanzeb, MD       Or  . ondansetron (ZOFRAN) injection 4 mg  4 mg Intravenous  Q6H PRN Erick BlinksMemon, Jehanzeb, MD      . pantoprazole (PROTONIX) EC tablet 20 mg  20 mg Oral Daily Erick BlinksMemon, Jehanzeb, MD   20 mg at 02/27/19 1917  . sacubitril-valsartan (ENTRESTO) 24-26 mg per tablet  1 tablet Oral BID Erick BlinksMemon, Jehanzeb, MD   1 tablet at 02/27/19 2047  . tamsulosin (FLOMAX) capsule 0.4 mg  0.4 mg Oral Daily Bjorn PippinWrenn, Makyna Niehoff, MD   0.4 mg at 02/27/19 1904     Objective: Vital signs in last 24 hours: Temp:  [97.4 F (36.3 C)-97.7 F (36.5 C)] 97.6 F (36.4 C) (09/08 0503) Pulse Rate:  [60-78] 60 (09/08 0503) Resp:  [14-22] 18 (09/08 0503) BP: (93-173)/(56-101) 93/56 (09/08 0503) SpO2:  [95 %-100 %] 97 % (09/08 0503)  Intake/Output from previous day: 09/07 0701 - 09/08 0700 In: 893 [P.O.:120; I.V.:572.6; IV Piggyback:200.5] Out: 820 [Urine:820] Intake/Output this shift: No intake/output data recorded.   Physical Exam Vitals signs reviewed.  Constitutional:      Appearance: He is well-developed.  Abdominal:     Palpations: Abdomen is soft.     Tenderness: There is no right CVA tenderness.  Neurological:     Mental Status: He is alert.  Lab Results:  Recent Labs    02/27/19 1050 02/28/19 0241  WBC 11.1* 7.8  HGB 14.9 13.0  HCT 43.9 38.3*  PLT 224 185   BMET Recent Labs    02/27/19 1050 02/28/19 0241  NA 139 137  K 4.4 4.2  CL 104 104  CO2 28 27  GLUCOSE 192* 180*  BUN 10 10  CREATININE 1.06 1.09  CALCIUM 8.8* 8.4*   PT/INR No results for input(s): LABPROT, INR in the last 72 hours. ABG No results for input(s): PHART, HCO3 in the last 72 hours.  Invalid input(s): PCO2, PO2  Studies/Results: Ct Renal Stone Study  Result Date: 02/27/2019 CLINICAL DATA:  Standard/full stone LLQ/left flank pain x 2days, no prior abd surgery EXAM: CT ABDOMEN AND PELVIS WITHOUT CONTRAST TECHNIQUE: Multidetector CT imaging of the abdomen and pelvis was performed following the standard protocol without IV contrast. COMPARISON:  Chest x-ray 07/12/2018 FINDINGS: Lower  chest: Emphysematous changes are seen at the lung bases. No focal consolidations or pleural effusions. Elevation of the LEFT hemidiaphragm is stable. Previous mitral annuloplasty. Heart size is normal. No pericardial effusion. Coronary artery calcifications. Hepatobiliary: Faintly radiopaque gallstone, gallstones, or sludge. The liver is homogeneous without focal abnormality. Pancreas: Unremarkable. No pancreatic ductal dilatation or surrounding inflammatory changes. Spleen: Normal in size without focal abnormality. Adrenals/Urinary Tract: The adrenal glands are normal in appearance. There is LEFT-sided hydronephrosis. Perinephric stranding identified on the LEFT. A nonobstructing calculus is identified in the LOWER pole the LEFT kidney measuring 2 millimeters. The LEFT ureter is dilated and mildly tortuous to level of the ureterovesical junction where there is a 2 millimeter calculus. The RIGHT kidney and ureter are unremarkable. The urinary bladder is notable for layering high attenuation material, suspicious for small stones. Alternatively, the patient may have had a recent contrast administration with residual contrast in the bladder. Stomach/Bowel: The stomach is distended and contains layering debris and fluid. Small bowel loops are normal in caliber. There are extensive colonic diverticula. No associated inflammatory changes or obstruction. Moderate stool burden. The appendix is well seen and has a normal appearance. Vascular/Lymphatic: There is atherosclerotic calcification of the abdominal aorta. Infrarenal abdominal aortic aneurysm is 4.6 x 4.4 centimeters. No retroperitoneal or mesenteric adenopathy. Reproductive: There is dense calcification of the prostate gland. Suspect a hydrocele the RIGHT hemiscrotum. Other: No ascites. Musculoskeletal: No acute or significant osseous findings. IMPRESSION: 1. Obstructing 2 millimeter calculus at the left ureterovesical junction. LEFT hydronephrosis and perinephric  stranding. 2. Nonobstructing 2 mm calculus in the LOWER pole of the LEFT kidney. 3. Layering high attenuation material within the urinary bladder, suspicious for small stones or recent contrast administration. 4. Infrarenal abdominal aortic aneurysm measuring 4.6 cm. Recommend followup by abdomen and pelvis CTA in 6 months, and vascular surgery referral/consultation if not already obtained. This recommendation follows ACR consensus guidelines: White Paper of the ACR Incidental Findings Committee II on Vascular Findings. J Am Coll Radiol 2013; 10:789-794. Aortic aneurysm NOS (ICD10-I71.9) 5. Faintly radiopaque gallstone, gallstones, or sludge. 6. Coronary artery disease. Status post mitral annuloplasty. 7. Colonic diverticulosis. 8. Moderate stool burden. 9. Suspect hydrocele the RIGHT hemiscrotum. 10. Aortic Atherosclerosis (ICD10-I70.0) and Emphysema (ICD10-J43.9). Electronically Signed   By: Norva Pavlov M.D.   On: 02/27/2019 12:42   AM labs reviewed.  Assessment and Plan: 63mm left UVJ stone and possible UTI.  He has no pain but hasn't clearly seen the stone pass.   He remains afebrile.  His BP is down slightly with the tamsulosin  but he has no dizziness.  I would recommend sending him home on the tamsulosin and antibiotic pending the culture.  I will have him call for f/u in my office for next week.  Continue to strain the urine.       LOS: 0 days    Irine Seal 02/28/2019 [phone] ID: Michael Banks, male   DOB: [birth year], 68 y.o.   MRN: [id number]

## 2019-02-28 NOTE — Discharge Instructions (Signed)
Dietary Guidelines to Help Prevent Kidney Stones Kidney stones are deposits of minerals and salts that form inside your kidneys. Your risk of developing kidney stones may be greater depending on your diet, your lifestyle, the medicines you take, and whether you have certain medical conditions. Most people can reduce their chances of developing kidney stones by following the instructions below. Depending on your overall health and the type of kidney stones you tend to develop, your dietitian may give you more specific instructions. What are tips for following this plan? Reading food labels  Choose foods with "no salt added" or "low-salt" labels. Limit your sodium intake to less than 1500 mg per day.  Choose foods with calcium for each meal and snack. Try to eat about 300 mg of calcium at each meal. Foods that contain 200-500 mg of calcium per serving include: ? 8 oz (237 ml) of milk, fortified nondairy milk, and fortified fruit juice. ? 8 oz (237 ml) of kefir, yogurt, and soy yogurt. ? 4 oz (118 ml) of tofu. ? 1 oz of cheese. ? 1 cup (300 g) of dried figs. ? 1 cup (91 g) of cooked broccoli. ? 1-3 oz can of sardines or mackerel.  Most people need 1000 to 1500 mg of calcium each day. Talk to your dietitian about how much calcium is recommended for you. Shopping  Buy plenty of fresh fruits and vegetables. Most people do not need to avoid fruits and vegetables, even if they contain nutrients that may contribute to kidney stones.  When shopping for convenience foods, choose: ? Whole pieces of fruit. ? Premade salads with dressing on the side. ? Low-fat fruit and yogurt smoothies.  Avoid buying frozen meals or prepared deli foods.  Look for foods with live cultures, such as yogurt and kefir. Cooking  Do not add salt to food when cooking. Place a salt shaker on the table and allow each person to add his or her own salt to taste.  Use vegetable protein, such as beans, textured vegetable  protein (TVP), or tofu instead of meat in pasta, casseroles, and soups. Meal planning   Eat less salt, if told by your dietitian. To do this: ? Avoid eating processed or premade food. ? Avoid eating fast food.  Eat less animal protein, including cheese, meat, poultry, or fish, if told by your dietitian. To do this: ? Limit the number of times you have meat, poultry, fish, or cheese each week. Eat a diet free of meat at least 2 days a week. ? Eat only one serving each day of meat, poultry, fish, or seafood. ? When you prepare animal protein, cut pieces into small portion sizes. For most meat and fish, one serving is about the size of one deck of cards.  Eat at least 5 servings of fresh fruits and vegetables each day. To do this: ? Keep fruits and vegetables on hand for snacks. ? Eat 1 piece of fruit or a handful of berries with breakfast. ? Have a salad and fruit at lunch. ? Have two kinds of vegetables at dinner.  Limit foods that are high in a substance called oxalate. These include: ? Spinach. ? Rhubarb. ? Beets. ? Potato chips and french fries. ? Nuts.  If you regularly take a diuretic medicine, make sure to eat at least 1-2 fruits or vegetables high in potassium each day. These include: ? Avocado. ? Banana. ? Orange, prune, carrot, or tomato juice. ? Baked potato. ? Cabbage. ? Beans and split   peas. General instructions   Drink enough fluid to keep your urine clear or pale yellow. This is the most important thing you can do.  Talk to your health care provider and dietitian about taking daily supplements. Depending on your health and the cause of your kidney stones, you may be advised: ? Not to take supplements with vitamin C. ? To take a calcium supplement. ? To take a daily probiotic supplement. ? To take other supplements such as magnesium, fish oil, or vitamin B6.  Take all medicines and supplements as told by your health care provider.  Limit alcohol intake to no  more than 1 drink a day for nonpregnant women and 2 drinks a day for men. One drink equals 12 oz of beer, 5 oz of wine, or 1 oz of hard liquor.  Lose weight if told by your health care provider. Work with your dietitian to find strategies and an eating plan that works best for you. What foods are not recommended? Limit your intake of the following foods, or as told by your dietitian. Talk to your dietitian about specific foods you should avoid based on the type of kidney stones and your overall health. Grains Breads. Bagels. Rolls. Baked goods. Salted crackers. Cereal. Pasta. Vegetables Spinach. Rhubarb. Beets. Canned vegetables. Pickles. Olives. Meats and other protein foods Nuts. Nut butters. Large portions of meat, poultry, or fish. Salted or cured meats. Deli meats. Hot dogs. Sausages. Dairy Cheese. Beverages Regular soft drinks. Regular vegetable juice. Seasonings and other foods Seasoning blends with salt. Salad dressings. Canned soups. Soy sauce. Ketchup. Barbecue sauce. Canned pasta sauce. Casseroles. Pizza. Lasagna. Frozen meals. Potato chips. French fries. Summary  You can reduce your risk of kidney stones by making changes to your diet.  The most important thing you can do is drink enough fluid. You should drink enough fluid to keep your urine clear or pale yellow.  Ask your health care provider or dietitian how much protein from animal sources you should eat each day, and also how much salt and calcium you should have each day. This information is not intended to replace advice given to you by your health care provider. Make sure you discuss any questions you have with your health care provider. Document Released: 10/03/2010 Document Revised: 09/28/2018 Document Reviewed: 05/19/2016 Elsevier Patient Education  2020 [employer]  

## 2019-03-01 LAB — URINE CULTURE

## 2019-03-01 LAB — BASIC METABOLIC PANEL
Anion gap: 6 (ref 5–15)
BUN: 9 mg/dL (ref 8–23)
CO2: 28 mmol/L (ref 22–32)
Calcium: 8.6 mg/dL — ABNORMAL LOW (ref 8.9–10.3)
Chloride: 108 mmol/L (ref 98–111)
Creatinine, Ser: 1 mg/dL (ref 0.61–1.24)
GFR calc Af Amer: >60 mL/min (ref >60)
GFR calc non Af Amer: >60 mL/min (ref >60)
Glucose, Bld: 145 mg/dL — ABNORMAL HIGH (ref 70–99)
Potassium: 3.9 mmol/L (ref 3.5–5.1)
Sodium: 142 mmol/L (ref 135–145)

## 2019-03-01 LAB — CBC
HCT: 36.7 % — ABNORMAL LOW (ref 39.0–52.0)
Hemoglobin: 12.2 g/dL — ABNORMAL LOW (ref 13.0–17.0)
MCH: 30.4 pg (ref 26.0–34.0)
MCHC: 33.2 g/dL (ref 30.0–36.0)
MCV: 91.5 fL (ref 80.0–100.0)
Platelets: 165 10*3/uL (ref 150–400)
RBC: 4.01 MIL/uL — ABNORMAL LOW (ref 4.22–5.81)
RDW: 12.7 % (ref 11.5–15.5)
WBC: 5.6 10*3/uL (ref 4.0–10.5)
nRBC: 0 % (ref 0.0–0.2)

## 2019-03-01 LAB — HEMOGLOBIN A1C
Hgb A1c MFr Bld: 6.5 % — ABNORMAL HIGH (ref 4.8–5.6)
Mean Plasma Glucose: 140 mg/dL

## 2019-03-01 LAB — GLUCOSE, CAPILLARY: Glucose-Capillary: 157 mg/dL — ABNORMAL HIGH (ref 70–99)

## 2019-03-01 MED ORDER — TAMSULOSIN HCL 0.4 MG PO CAPS: 0.4000 mg | ORAL_CAPSULE | Freq: Every day | ORAL | 0 refills | Status: DC

## 2019-03-01 MED ORDER — CEPHALEXIN 500 MG PO CAPS: 500.0000 mg | ORAL_CAPSULE | Freq: Two times a day (BID) | ORAL | 0 refills | Status: AC

## 2019-03-01 NOTE — Discharge Summary (Signed)
Physician Discharge Summary  Michael Banks YPP:[id number] DOB: 1950/12/11 DOA: 02/27/2019  PCP: Leeroy Cha, MD  Admit date: 02/27/2019 Discharge date: 03/01/2019  Admitted From: Home Disposition: Home  Recommendations for Outpatient Follow-up:  1. Follow up with PCP in 1-2 weeks 2. Please obtain BMP/CBC in one week  Discharge Condition: Stable CODE STATUS: Full Diet recommendation: As tolerated  Brief/Interim Summary: Michael Banks is a 68 y.o. male with medical history significant of coronary artery disease status post CABG, chronic systolic congestive heart failure with ejection fraction of 25 to 30%, presents to the hospital with complaints of left lower quadrant abdominal pain rating to his left flank.  Symptoms were mildly present yesterday, but have substantially gotten worse today.  He is not had any fever, nausea, vomiting, diarrhea.  No chest pain or shortness of breath.  He has been experiencing dyspepsia for the last month, symptoms usually improves when he passes gas.  He feels this is unrelated to his current presentation.  Denies any hematuria or dysuria. He was evaluated in the emergency room where he was noted to be hemodynamically stable.  Basic labs otherwise unrevealing.  Urinalysis indicated positive nitrites indicative of possible infection.  CT scan of the abdomen shows left-sided hydronephrosis with 2 mm stone as well as incidental finding of abdominal aortic aneurysm.  Patient met as above with noted nephrolithiasis concerning for UTI possibly infected stone.  Fortunately patient's stone is quite small at 2 mm on imaging, given provement in symptoms patient requesting discharge home.  Unfortunately patient's cultures appear to be growing mixed flora with no clear indication for single organism, as such patient will be continued on Keflex for an additional 10 days to ensure adequate coverage as well as Flomax per urology recommendations.  Patient to be discharged  with urinary strainer to follow for passage of nephrolithiasis outpatient follow-up with urology as scheduled.  Follow-up with PCP as previously scheduled.  Discharge Diagnoses:  Principal Problem:   Urinary tract obstruction by kidney stone Active Problems:   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   Ischemic cardiomyopathy   Chronic systolic heart failure (HCC)   Essential hypertension   Hyperlipidemia   Ureteral colic   Hydronephrosis of left kidney   Acute lower UTI   Abdominal aortic aneurysm (AAA) ([place])   Hyperglycemia   Discharge Instructions   Allergies as of 03/01/2019   No Known Allergies     Medication List    STOP taking these medications   lisinopril 5 MG tablet Commonly known as: ZESTRIL     TAKE these medications   aspirin 81 MG EC tablet Take 1 tablet (81 mg total) by mouth daily.   atorvastatin 80 MG tablet Commonly known as: LIPITOR TAKE 1 TABLET BY MOUTH EVERY DAY AT 6 PM What changed: See the new instructions.   carvedilol 25 MG tablet Commonly known as: COREG Take 1 tablet (25 mg total) by mouth 2 (two) times daily.   cephALEXin 500 MG capsule Commonly known as: KEFLEX Take 1 capsule (500 mg total) by mouth 2 (two) times daily for 10 days.   Entresto 24-26 MG Generic drug: sacubitril-valsartan Take 1 tablet by mouth 2 (two) times daily.   ONE TOUCH ULTRA 2 w/Device Kit   OneTouch Delica Plus [id number] Misc   OneTouch Ultra test strip Generic drug: glucose blood   pantoprazole 20 MG tablet Commonly known as: Protonix Take 1 tablet (20 mg total) by mouth daily.   tamsulosin 0.4 MG Caps  capsule Commonly known as: FLOMAX Take 1 capsule (0.4 mg total) by mouth daily. Start taking on: March 02, 2019      Follow-up Information    Irine Seal, MD. Call.   Specialty: Urology Why: Please call the office to arrange f/u for next week with me or one of our nurse practitioners if you have not heard from the office by  tomorrow.  Contact information: [place] [address] [phone]          No Known Allergies  Consultations:  Urology, Dr. Jeffie Pollock  Procedures/Studies: Ct Renal Stone Study  Result Date: 02/27/2019 CLINICAL DATA:  Standard/full stone LLQ/left flank pain x 2days, no prior abd surgery EXAM: CT ABDOMEN AND PELVIS WITHOUT CONTRAST TECHNIQUE: Multidetector CT imaging of the abdomen and pelvis was performed following the standard protocol without IV contrast. COMPARISON:  Chest x-ray 07/12/2018 FINDINGS: Lower chest: Emphysematous changes are seen at the lung bases. No focal consolidations or pleural effusions. Elevation of the LEFT hemidiaphragm is stable. Previous mitral annuloplasty. Heart size is normal. No pericardial effusion. Coronary artery calcifications. Hepatobiliary: Faintly radiopaque gallstone, gallstones, or sludge. The liver is homogeneous without focal abnormality. Pancreas: Unremarkable. No pancreatic ductal dilatation or surrounding inflammatory changes. Spleen: Normal in size without focal abnormality. Adrenals/Urinary Tract: The adrenal glands are normal in appearance. There is LEFT-sided hydronephrosis. Perinephric stranding identified on the LEFT. A nonobstructing calculus is identified in the LOWER pole the LEFT kidney measuring 2 millimeters. The LEFT ureter is dilated and mildly tortuous to level of the ureterovesical junction where there is a 2 millimeter calculus. The RIGHT kidney and ureter are unremarkable. The urinary bladder is notable for layering high attenuation material, suspicious for small stones. Alternatively, the patient may have had a recent contrast administration with residual contrast in the bladder. Stomach/Bowel: The stomach is distended and contains layering debris and fluid. Small bowel loops are normal in caliber. There are extensive colonic diverticula. No associated inflammatory changes or obstruction. Moderate stool burden. The appendix  is well seen and has a normal appearance. Vascular/Lymphatic: There is atherosclerotic calcification of the abdominal aorta. Infrarenal abdominal aortic aneurysm is 4.6 x 4.4 centimeters. No retroperitoneal or mesenteric adenopathy. Reproductive: There is dense calcification of the prostate gland. Suspect a hydrocele the RIGHT hemiscrotum. Other: No ascites. Musculoskeletal: No acute or significant osseous findings. IMPRESSION: 1. Obstructing 2 millimeter calculus at the left ureterovesical junction. LEFT hydronephrosis and perinephric stranding. 2. Nonobstructing 2 mm calculus in the LOWER pole of the LEFT kidney. 3. Layering high attenuation material within the urinary bladder, suspicious for small stones or recent contrast administration. 4. Infrarenal abdominal aortic aneurysm measuring 4.6 cm. Recommend followup by abdomen and pelvis CTA in 6 months, and vascular surgery referral/consultation if not already obtained. This recommendation follows ACR consensus guidelines: White Paper of the ACR Incidental Findings Committee II on Vascular Findings. J Am Coll Radiol 2013; 10:789-794. Aortic aneurysm NOS (ICD10-I71.9) 5. Faintly radiopaque gallstone, gallstones, or sludge. 6. Coronary artery disease. Status post mitral annuloplasty. 7. Colonic diverticulosis. 8. Moderate stool burden. 9. Suspect hydrocele the RIGHT hemiscrotum. 10. Aortic Atherosclerosis (ICD10-I70.0) and Emphysema (ICD10-J43.9). Electronically Signed   By: Nolon Nations M.D.   On: 02/27/2019 12:42    Subjective:  No acute issues overnight, patient feels quite well, denies chest pain, shortness of breath, nausea, vomiting, flank pain or hematuria.   Discharge Exam: Vitals:   02/28/19 2030 03/01/19 0548  BP: (!) 103/57 120/82  Pulse: 67 61  Resp:  Temp: 98.8 F (37.1 C) 97.8 F (36.6 C)  SpO2: 100% 99%   Vitals:   02/28/19 1037 02/28/19 1557 02/28/19 2030 03/01/19 0548  BP: 130/67 104/62 (!) 103/57 120/82  Pulse:  68 67 61   Resp:  14    Temp:  98.5 F (36.9 C) 98.8 F (37.1 C) 97.8 F (36.6 C)  TempSrc:  Oral  Oral  SpO2:  98% 100% 99%    General:  Pleasantly resting in bed, No acute distress. HEENT:  Normocephalic atraumatic.  Sclerae nonicteric, noninjected.  Extraocular movements intact bilaterally. Neck:  Without mass or deformity.  Trachea is midline. Lungs:  Clear to auscultate bilaterally without rhonchi, wheeze, or rales. Heart:  Regular rate and rhythm.  Without murmurs, rubs, or gallops. Abdomen:  Soft, nontender, nondistended.  Without guarding or rebound. Extremities: Without cyanosis, clubbing, edema, or obvious deformity. Vascular:  Dorsalis pedis and posterior tibial pulses palpable bilaterally. Skin:  Warm and dry, no erythema, no ulcerations.   The results of significant diagnostics from this hospitalization (including imaging, microbiology, ancillary and laboratory) are listed below for reference.     Microbiology: Recent Results (from the past 240 hour(s))  Urine culture     Status: Abnormal   Collection Time: 02/27/19 12:50 PM   Specimen: Urine, Random  Result Value Ref Range Status   Specimen Description URINE, RANDOM  Final   Special Requests   Final    NONE Performed at [facility], [address] [place], [place] [address]    Culture MULTIPLE SPECIES PRESENT, SUGGEST RECOLLECTION (A)  Final   Report Status 03/01/2019 FINAL  Final  SARS CORONAVIRUS 2 (TAT 6-24 HRS) Nasopharyngeal Nasopharyngeal Swab     Status: None   Collection Time: 02/27/19  3:17 PM   Specimen: Nasopharyngeal Swab  Result Value Ref Range Status   SARS Coronavirus 2 NEGATIVE NEGATIVE Final    Comment: (NOTE) SARS-CoV-2 target nucleic acids are NOT DETECTED. The SARS-CoV-2 RNA is generally detectable in upper and lower respiratory specimens during the acute phase of infection. Negative results do not preclude SARS-CoV-2 infection, do not rule out co-infections with other pathogens, and  should not be used as the sole basis for treatment or other patient management decisions. Negative results must be combined with clinical observations, patient history, and epidemiological information. The expected result is Negative. Fact Sheet for Patients: SugarRoll.be Fact Sheet for Healthcare Providers: https://www.woods-mathews.com/ This test is not yet approved or cleared by the [place] FDA and  has been authorized for detection and/or diagnosis of SARS-CoV-2 by FDA under an Emergency Use Authorization (EUA). This EUA will remain  in effect (meaning this test can be used) for the duration of the COVID-19 declaration under Section 56 4(b)(1) of the Act, 21 U.S.C. section 360bbb-3(b)(1), unless the authorization is terminated or revoked sooner. Performed at [facility], [place] [address] [place], [place] [address]      Labs: BNP (last 3 results) Recent Labs    05/03/18 1324  BNP 2,542.7*   Basic Metabolic Panel: Recent Labs  Lab 02/27/19 1050 02/28/19 0241 03/01/19 0124  NA 139 137 142  K 4.4 4.2 3.9  CL 104 104 108  CO2 _0 GLUCOSE 192* 180* 145*  BUN _1 CREATININE 1.06 1.09 1.00  CALCIUM 8.8* 8.4* 8.6*   Liver Function Tests: Recent Labs  Lab 02/27/19 1050 02/28/19 0241  AST 26 22  ALT 28 20  ALKPHOS 59 48  BILITOT  1.1 0.8  PROT 7.2 5.7*  ALBUMIN 4.1 3.0*   Recent Labs  Lab 02/27/19 1050  LIPASE 40   CBC: Recent Labs  Lab 02/27/19 1050 02/28/19 0241 03/01/19 0124  WBC 11.1* 7.8 5.6  HGB 14.9 13.0 12.2*  HCT 43.9 38.3* 36.7*  MCV 91.1 91.2 91.5  PLT 224 185 165   CBG: Recent Labs  Lab 02/28/19 1205 02/28/19 1733 02/28/19 2124 03/01/19 0807  GLUCAP 179* 164* 186* 157*   Hgb A1c Recent Labs    02/28/19 0241  HGBA1C 6.5*   Urinalysis    Component Value Date/Time   COLORURINE YELLOW 02/27/2019 1049   APPEARANCEUR CLOUDY (A) 02/27/2019 1049   LABSPEC 1.013  02/27/2019 1049   PHURINE 5.0 02/27/2019 1049   GLUCOSEU 50 (A) 02/27/2019 1049   HGBUR LARGE (A) 02/27/2019 1049   BILIRUBINUR NEGATIVE 02/27/2019 1049   KETONESUR NEGATIVE 02/27/2019 1049   PROTEINUR 30 (A) 02/27/2019 1049   NITRITE POSITIVE (A) 02/27/2019 1049   LEUKOCYTESUR LARGE (A) 02/27/2019 1049   Microbiology Recent Results (from the past 240 hour(s))  Urine culture     Status: Abnormal   Collection Time: 02/27/19 12:50 PM   Specimen: Urine, Random  Result Value Ref Range Status   Specimen Description URINE, RANDOM  Final   Special Requests   Final    NONE Performed at [facility], [place] [address], [place], [place] [address]    Culture MULTIPLE SPECIES PRESENT, SUGGEST RECOLLECTION (A)  Final   Report Status 03/01/2019 FINAL  Final  SARS CORONAVIRUS 2 (TAT 6-24 HRS) Nasopharyngeal Nasopharyngeal Swab     Status: None   Collection Time: 02/27/19  3:17 PM   Specimen: Nasopharyngeal Swab  Result Value Ref Range Status   SARS Coronavirus 2 NEGATIVE NEGATIVE Final    Comment: (NOTE) SARS-CoV-2 target nucleic acids are NOT DETECTED. The SARS-CoV-2 RNA is generally detectable in upper and lower respiratory specimens during the acute phase of infection. Negative results do not preclude SARS-CoV-2 infection, do not rule out co-infections with other pathogens, and should not be used as the sole basis for treatment or other patient management decisions. Negative results must be combined with clinical observations, patient history, and epidemiological information. The expected result is Negative. Fact Sheet for Patients: SugarRoll.be Fact Sheet for Healthcare Providers: https://www.woods-mathews.com/ This test is not yet approved or cleared by the [place] FDA and  has been authorized for detection and/or diagnosis of SARS-CoV-2 by FDA under an Emergency Use Authorization (EUA). This EUA will remain  in effect (meaning this  test can be used) for the duration of the COVID-19 declaration under Section 56 4(b)(1) of the Act, 21 U.S.C. section 360bbb-3(b)(1), unless the authorization is terminated or revoked sooner. Performed at [facility], [place] [address] [place], [place] [address]      Time coordinating discharge: Over 30 minutes  SIGNED:   Little Ishikawa, DO [facility] 03/01/2019, 11:30 AM Pager   If 7PM-7AM, please contact night-coverage [email or web address] Password [email or web address]

## 2019-03-07 DIAGNOSIS — R351 Nocturia: Secondary | ICD-10-CM | POA: Diagnosis not present

## 2019-03-07 DIAGNOSIS — Z8546 Personal history of malignant neoplasm of prostate: Secondary | ICD-10-CM | POA: Diagnosis not present

## 2019-03-07 DIAGNOSIS — N201 Calculus of ureter: Secondary | ICD-10-CM | POA: Diagnosis not present

## 2019-03-08 ENCOUNTER — Telehealth: Payer: Self-pay | Admitting: Cardiology

## 2019-03-08 NOTE — Telephone Encounter (Signed)
Will send this message to Dr. Nelson as a general FYI.  

## 2019-03-08 NOTE — Telephone Encounter (Signed)
New Message    Patient was in the hospital with kidney stones and wants to make the doctor aware.

## 2019-03-15 ENCOUNTER — Telehealth: Payer: Self-pay | Admitting: Cardiology

## 2019-03-15 NOTE — Telephone Encounter (Signed)
New Message    Pt c/o medication issue:  1. Name of Medication: Carvedilol 25mg   2. How are you currently taking this medication (dosage and times per day)?  1 tablet by mouth twice a day  3. Are you having a reaction (difficulty breathing--STAT)? No  4. What is your medication issue? Patient worried it may make his BP drop.  Please call to discuss.

## 2019-03-15 NOTE — Telephone Encounter (Signed)
Pt is calling to make sure he is to increase his carvedilol to 25 mg po bid, as last instructed by Estella Husk PA-C at last OV.  Advised the pt that yes, he is to take this for his depressed EF.  Pt states his BP is running around 118/74 and stays around there.  Advised the pt that it is ok to proceed with taking this med as instructed, and if any concerns or symptoms, he can monitor his BP and call us back to further advise if its safe to proceed with his dose of coreg. Also advised the pt to follow-up as planned with Dr Rayann Heman for 10/9.  Advised the pt to stay well hydrated and take this med with meals. Pt verbalized understanding and agrees with this plan.

## 2019-03-27 ENCOUNTER — Telehealth: Payer: Self-pay

## 2019-03-27 DIAGNOSIS — R69 Illness, unspecified: Secondary | ICD-10-CM | POA: Diagnosis not present

## 2019-03-27 NOTE — Telephone Encounter (Signed)
Spoke with pt regarding his appt on 03/31/19. Pt stated he wants office visits only. Pt was advise to keep virtual appt, but declined. Pt was advise to call the office if anything changes.

## 2019-03-31 ENCOUNTER — Telehealth: Payer: Medicare HMO | Admitting: Internal Medicine

## 2019-04-04 ENCOUNTER — Telehealth: Payer: Self-pay | Admitting: *Deleted

## 2019-04-04 NOTE — Telephone Encounter (Signed)
-----   Message from Michael Banks sent at 04/04/2019 10:21 AM EDT ----- Regarding: Referral to [place],    [name] was referred to EP. I originally scheduled him for 10.09.20 with Dr. Rayann Heman, he canceled that appt because he wanted an in office visit. I was able to get him RS to 10.19.20 with Dr. Curt Bears in office. I saw he canceled that appt stating he wanted to RS for a later date. I called him back this morning and he said he wanted to wait until December when he's supposed to come back  and see Ermalinda Barrios. I told him this was for him to come in and discuss a defibrillator per Dr. Meda Coffee, he said "I've lived this long without it, I don't know how important it is that it can't wait." So I have the patient scheduled for 12.15.20 with Ermalinda Barrios at 9:30 and then Dr. Curt Bears at 57. The pt requested a morning appt and have both on the same day.    Just FYI.   Thank you! [employer]

## 2019-04-04 NOTE — Telephone Encounter (Signed)
Dr. Meda Coffee, this is a general FYI to let you know the pt keeps pushing his EP referral we advised on awhile back out.  Below is the message to EP Scheduler sent, in regards to why he keeps pushing the appt out.

## 2019-04-10 ENCOUNTER — Institutional Professional Consult (permissible substitution): Payer: Medicare HMO | Admitting: Cardiology

## 2019-05-31 NOTE — Progress Notes (Deleted)
Cardiology Office Note    Date:  05/31/2019   ID:  Michael Banks, DOB [birth year], MRN [id number]  PCP:  Michael Banks, Rupashree, MD  Cardiologist: Tobias AlexanderKatarina Nelson, MD EPS: None  No chief complaint on file.   History of Present Illness:  Michael Shutterhilip E Blinder is a 68 y.o. male with history of HTN, HLD, tobaccoa abuse. Admitted 04/2018 with CHF LVEF 20-25% found to have severe CAD and MR and underwent CABG x 5 and MV repair 05/13/18. Brief Afib post op treated with Amio. He was to stay on Coumadin and ASA per CVTS.    I saw the patient 07/06/18 at which time he lost 25 lbs no appetite and BP 80/62. Saw Dr. Delton SeeNelson 12/13/18 and doing well on low dose lisinopril. F/u echo 02/07/19 LVEF 25-30% and [facility] which he's unable to afford. Dr. Delton SeeNelson recommends referal to EP for possible ICD. Patient has canceled several appts with EP-see phone notes.  I saw him 02/22/19 and unable to afford entresto so I switched him to to lisinopril   Patient discharged 03/01/19 with Urinary tract obstruction by possibly infected kidney stone and they stopped lisinopril. CT had an incidental finding of AAA 4.6 cm and needs f/u 6 months.   Past Medical History:  Diagnosis Date  . Dilated cardiomyopathy (HCC)   . Hydronephrosis   . Hyperglycemia   . Hypertension   . Kidney stone     Past Surgical History:  Procedure Laterality Date  . CORONARY ARTERY BYPASS GRAFT N/A 05/13/2018   Procedure: CORONARY ARTERY BYPASS GRAFTING (CABG) times _five__ using left internal mammary artery and right  greater saphenous vein harvested endoscopically.;  Surgeon: Loreli SlotHendrickson, Steven C, MD;  [facility];  Service: Open Heart Surgery;  Laterality: N/A;  . KNEE ARTHROSCOPY    . MITRAL VALVE REPAIR N/A 05/13/2018   Procedure: Mitral Valve Repair;  Surgeon: Loreli SlotHendrickson, Steven C, MD;  [facility];  Service: Open Heart Surgery;  Laterality: N/A;  . NOSE SURGERY    . RIGHT/LEFT HEART CATH AND CORONARY ANGIOGRAPHY N/A  05/05/2018   Procedure: RIGHT/LEFT HEART CATH AND CORONARY ANGIOGRAPHY;  Surgeon: Marykay LexHarding, David W, MD;  [facility];  Service: Cardiovascular;  Laterality: N/A;  . TEE WITHOUT CARDIOVERSION N/A 05/13/2018   Procedure: TRANSESOPHAGEAL ECHOCARDIOGRAM (TEE);  Surgeon: Loreli SlotHendrickson, Steven C, MD;  [facility];  Service: Open Heart Surgery;  Laterality: N/A;  . ULTRASOUND GUIDANCE FOR VASCULAR ACCESS  05/05/2018   Procedure: Ultrasound Guidance For Vascular Access;  Surgeon: Marykay LexHarding, David W, MD;  [facility];  Service: Cardiovascular;;    Current Medications: No outpatient medications have been marked as taking for the 06/06/19 encounter (Appointment) with Dyann KiefLenze,  M, PA-C.     Allergies:   Patient has no known allergies.   Social History   Socioeconomic History  . Marital status: Single    Spouse name: Not on file  . Number of children: Not on file  . Years of education: Not on file  . Highest education level: Not on file  Occupational History  . Not on file  Social Needs  . Financial resource strain: Not on file  . Food insecurity    Worry: Not on file    Inability: Not on file  . Transportation needs    Medical: Not on file    Non-medical: Not on file  Tobacco Use  . Smoking status: Former Games developermoker  . Smokeless tobacco: Never Used  Substance and  Sexual Activity  . Alcohol use: Never    Frequency: Never  . Drug use: Never  . Sexual activity: Not on file  Lifestyle  . Physical activity    Days per week: Not on file    Minutes per session: Not on file  . Stress: Not on file  Relationships  . Social Musician on phone: Not on file    Gets together: Not on file    Attends religious service: Not on file    Active member of club or organization: Not on file    Attends meetings of clubs or organizations: Not on file    Relationship status: Not on file  Other Topics Concern  . Not on file  Social History Narrative  .  Not on file     Family History:  The patient's ***family history includes CAD in his brother; CVA in his father.   ROS:   Please see the history of present illness.    ROS All other systems reviewed and are negative.   PHYSICAL EXAM:   VS:  There were no vitals taken for this visit.  Physical Exam  GEN: Well nourished, well developed, in no acute distress  HEENT: normal  Neck: no JVD, carotid bruits, or masses Cardiac:RRR; no murmurs, rubs, or gallops  Respiratory:  clear to auscultation bilaterally, normal work of breathing GI: soft, nontender, nondistended, + BS Ext: without cyanosis, clubbing, or edema, Good distal pulses bilaterally MS: no deformity or atrophy  Skin: warm and dry, no rash Neuro:  Alert and Oriented x 3, Strength and sensation are intact Psych: euthymic mood, full affect  Wt Readings from Last 3 Encounters:  02/22/19 173 lb (78.5 kg)  12/14/18 168 lb (76.2 kg)  08/03/18 157 lb 6.4 oz (71.4 kg)      Studies/Labs Reviewed:   EKG:  EKG is*** ordered today.  The ekg ordered today demonstrates ***  Recent Labs: 02/28/2019: ALT 20 03/01/2019: BUN 9; Creatinine, Ser 1.00; Hemoglobin 12.2; Platelets 165; Potassium 3.9; Sodium 142   Lipid Panel    Component Value Date/Time   CHOL 122 08/03/2018 0819   TRIG 76 08/03/2018 0819   HDL 46 08/03/2018 0819   CHOLHDL 2.7 08/03/2018 0819   CHOLHDL 4.2 05/04/2018 0433   VLDL 11 05/04/2018 0433   LDLCALC 61 08/03/2018 0819    Additional studies/ records that were reviewed today include: CT 02/27/19  IMPRESSION: 1. Obstructing 2 millimeter calculus at the left ureterovesical junction. LEFT hydronephrosis and perinephric stranding. 2. Nonobstructing 2 mm calculus in the LOWER pole of the LEFT kidney. 3. Layering high attenuation material within the urinary bladder, suspicious for small stones or recent contrast administration. 4. Infrarenal abdominal aortic aneurysm measuring 4.6 cm. Recommend followup by abdomen  and pelvis CTA in 6 months, and vascular surgery referral/consultation if not already obtained. This recommendation follows ACR consensus guidelines: White Paper of the ACR Incidental Findings Committee II on Vascular Findings. J Am Coll Radiol 2013; 10:789-794. Aortic aneurysm NOS (ICD10-I71.9) 5. Faintly radiopaque gallstone, gallstones, or sludge. 6. Coronary artery disease. Status post mitral annuloplasty. 7. Colonic diverticulosis. 8. Moderate stool burden. 9. Suspect hydrocele the RIGHT hemiscrotum. 10. Aortic Atherosclerosis (ICD10-I70.0) and Emphysema (ICD10-J43.9).    Echo 8/11/20IMPRESSIONS      1. The left ventricle has moderate-severely reduced systolic function, with an ejection fraction of 25-30%. The cavity size was normal. Left ventricular diastolic Doppler parameters are consistent with impaired relaxation. Elevated left ventricular  end-diastolic pressure Left  ventricular diffuse hypokinesis. Probable apical akinesis. Patient refused definity contrast study therefore cannot rule out apical thrombus.  2. The average left ventricular global longitudinal strain is -8.8 %.  3. The aortic valve is tricuspid. Aortic valve regurgitation is mild by color flow Doppler.  4. The right ventricle has normal systolic function. The cavity was normal. There is no increase in right ventricular wall thickness. Right ventricular systolic pressure could not be assessed.  5. S/P Mitral valve repair. There is mild mitral regurgitation and mild mitral stenosis with mean MV gradient 38mmHg.  6. There is mild dilatation of the aortic root measuring 41 mm.  7. The interatrial septum appears to be lipomatous.   FINDINGS  Left Ventricle: The left ventricle has severely reduced systolic function, with an ejection fraction of 25-30%. The cavity size was normal. There is no increase in left ventricular wall thickness. Left ventricular diastolic Doppler parameters are  consistent with impaired  relaxation. Elevated left ventricular end-diastolic pressure Left ventricular diffuse hypokinesis. The average left ventricular global longitudinal strain is -8.8 %.   Right Ventricle: The right ventricle has normal systolic function. The cavity was normal. There is no increase in right ventricular wall thickness. Right ventricular systolic pressure could not be assessed.   Left Atrium: Left atrial size was normal in size.   Right Atrium: Right atrial size was normal in size. Right atrial pressure is estimated at 3 mmHg.   Interatrial Septum: No atrial level shunt detected by color flow Doppler. Increased thickness of the atrial septum sparing the fossa ovalis consistent with The interatrial septum appears to be lipomatous.   Pericardium: There is no evidence of pericardial effusion.   Mitral Valve: The mitral valve has been repaired/replaced.Mitral valve regurgitation is mild by color flow Doppler. S/P Mitral valve repair. There is mild mitral regurgitation and mild mitral stenosis with mean MV gradient 66mmHg.   Tricuspid Valve: The tricuspid valve is normal in structure. Tricuspid valve regurgitation was not visualized by color flow Doppler.   Aortic Valve: The aortic valve is tricuspid Aortic valve regurgitation is mild by color flow Doppler.   Pulmonic Valve: The pulmonic valve was normal in structure. Pulmonic valve regurgitation is not visualized by color flow Doppler.   Aorta: The aorta is abnormal in size and structure. There is mild dilatation of the aortic root measuring 41 mm.   Venous: The inferior vena cava measures 0.60 cm, is normal in size with greater than 50% respiratory variability.   Compared to previous exam: 08/09/18 EF 25-30%.      ASSESSMENT:    1. 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 Ischemic cardiomyopathy   3. Chronic systolic heart failure ([place])   4. Essential hypertension   5. Hyperlipidemia, unspecified hyperlipidemia type       PLAN:  In order of problems listed above:  CAD status post CABG x5 and mitral valve repair 04/2018 on Lipitor and aspirin   Ischemic cardiomyopathy ejection fraction 25 to 30% on repeat echo 02/07/19. low-dose lisinopril switched to entresto but patient can't afford. Dr. Meda Coffee recommends referral to EP for ICD but several appt cancelled.   lisinopril 5 mg stop in hospital when he had renal stone      Chronic systolic CHF compensated   Essential hypertension controlled   Hyperlipidemia on Lipitor      AAA 4.6 cm on CT 02/27/19 needs repeat 6 months.         Medication Adjustments/Labs and Tests Ordered: Current  medicines are reviewed at length with the patient today.  Concerns regarding medicines are outlined above.  Medication changes, Labs and Tests ordered today are listed in the Patient Instructions below. There are no Patient Instructions on file for this visit.   Elson Clan, PA-C  05/31/2019 3:44 PM    [facility] [address] [place], [place], [place]  [address] Phone: [phone]; Fax: [phone]

## 2019-06-06 ENCOUNTER — Institutional Professional Consult (permissible substitution): Payer: Medicare HMO | Admitting: Cardiology

## 2019-06-06 ENCOUNTER — Ambulatory Visit: Payer: Medicare HMO | Admitting: Physician Assistant

## 2019-06-07 ENCOUNTER — Encounter: Payer: Self-pay | Admitting: General Practice

## 2019-06-29 ENCOUNTER — Encounter: Payer: Self-pay | Admitting: Gastroenterology

## 2019-06-30 ENCOUNTER — Telehealth: Payer: Self-pay | Admitting: Physician Assistant

## 2019-06-30 NOTE — Telephone Encounter (Signed)
Pt c/o medication issue:  1. Name of Medication: pantoprazole (PROTONIX) 20 MG tablet  2. How are you currently taking this medication (dosage and times per day)? Take 1 tablet (20 mg total) by mouth daily.  3. Are you having a reaction (difficulty breathing--STAT)? ---  4. What is your medication issue? Patient wants to know if he can start taking it.  He states he was prescribed this medication back in September, but he didn't start taking it back then, as he was worried of the side effects.  However, his GI issues have gotten worse.

## 2019-06-30 NOTE — Telephone Encounter (Signed)
Called patient. He states that he has been having increased heartburn and gas and the Gas-X has not helped. Patient was prescribed protonix by Jacolyn Reedy, PA in September 2020 for this but he never started. Instructed patient to start the protonix 20 mg QD to see if his Sx improve. Patient has follow up with Lenze on 2/2 and with GI MD on 2/3. Patient will let us know if his Sx change or worsen.

## 2019-07-25 ENCOUNTER — Ambulatory Visit: Payer: Medicare HMO | Admitting: Physician Assistant

## 2019-07-26 ENCOUNTER — Ambulatory Visit: Payer: Medicare HMO | Admitting: Gastroenterology

## 2019-08-01 DIAGNOSIS — K219 Gastro-esophageal reflux disease without esophagitis: Secondary | ICD-10-CM | POA: Insufficient documentation

## 2019-08-01 NOTE — Progress Notes (Signed)
Virtual Visit via Telephone Note   This visit type was conducted due to national recommendations for restrictions regarding the COVID-19 Pandemic (e.g. social distancing) in an effort to limit this patient's exposure and mitigate transmission in our community.  Due to his co-morbid illnesses, this patient is at least at moderate risk for complications without adequate follow up.  This format is felt to be most appropriate for this patient at this time.  The patient did not have access to video technology/had technical difficulties with video requiring transitioning to audio format only (telephone).  All issues noted in this document were discussed and addressed.  No physical exam could be performed with this format.  Please refer to the patient's chart for his  consent to telehealth for [facility].   Date:  08/02/2019   ID:  Michael Banks, DOB [birth year], MRN [id number]  Patient Location: Home Provider Location: Office  PCP:  Leeroy Cha, MD  Cardiologist:  Ena Dawley, MD   Electrophysiologist:  None   Evaluation Performed:  Follow-Up Visit  Chief Complaint:  F/U  History of Present Illness:    Michael Banks is a 68 y.o. male with history of HTN, HLD, tobaccoa abuse. Admitted 04/2018 with CHF LVEF 20-25% found to have severe CAD and MR and underwent CABG x 5 and MV repair 05/13/18. Brief Afib post op treated with Amio. He was to stay on Coumadin and ASA per CVTS.    I saw the patient 07/06/18 at which time he lost 25 lbs no appetite and BP 80/62. Saw Dr. Meda Coffee 12/13/18 and doing well on low dose lisinopril. F/u echo 02/07/19 LVEF 25-30% and switch to [facility] which he's unable to afford. Dr. Meda Coffee recommends referal to EP for possible ICD    I saw the patient 02/22/2019 at which time he could not afford Entresto so we switched him back to lisinopril.  He was complaining of a lot of gas and sharp shooting pains relieved with passing gas as well as urinary frequency.   Dr. Meda Coffee recommended referral to EP for ICD.  Patient had an appointment with Dr. Rayann Heman 03/31/2019 but he canceled because he did not want to do a virtual visit.  He was now scheduled appointment in the office with Dr. Curt Bears 04/10/2019 and not was canceled as well.  Appointments in December were canceled as well.  Patient was hospitalized with UTI secondary to kidney stone 02/2019 and was found to have abdominal aortic aneurysm measuring at 4.6 cm.  Recommended follow-up abdominal and pelvic CTA in 6 months and vascular surgery referral.  Patient called in complaining of increased gas and indigestion.  He had never started the Protonix I prescribed for him. That improved with green teas. Says when he moves his arm or leg he gets a "jolt" to his heart. Denies dyspnea, chest pain, dizziness or presyncope. Feels better when he walks. Carries groceries home and denies chest pain. Calves hurt when he walks but eases with rest. Doesn't want to have dopplers at this time.     The patient does not have symptoms concerning for COVID-19 infection (fever, chills, cough, or new shortness of breath).    Past Medical History:  Diagnosis Date  . Dilated cardiomyopathy ([place])   . Hydronephrosis   . Hyperglycemia   . Hypertension   . Kidney stone    Past Surgical History:  Procedure Laterality Date  . CORONARY ARTERY BYPASS GRAFT N/A 05/13/2018   Procedure: CORONARY ARTERY BYPASS GRAFTING (CABG) times _five__  using left internal mammary artery and right  greater saphenous vein harvested endoscopically.;  Surgeon: Melrose Nakayama, MD;  Location: [place];  Service: Open Heart Surgery;  Laterality: N/A;  . KNEE ARTHROSCOPY    . MITRAL VALVE REPAIR N/A 05/13/2018   Procedure: Mitral Valve Repair;  Surgeon: Melrose Nakayama, MD;  Location: [place];  Service: Open Heart Surgery;  Laterality: N/A;  . NOSE SURGERY    . RIGHT/LEFT HEART CATH AND CORONARY ANGIOGRAPHY N/A 05/05/2018   Procedure: RIGHT/LEFT  HEART CATH AND CORONARY ANGIOGRAPHY;  Surgeon: Leonie Man, MD;  Location: [place] [facility];  Service: Cardiovascular;  Laterality: N/A;  . TEE WITHOUT CARDIOVERSION N/A 05/13/2018   Procedure: TRANSESOPHAGEAL ECHOCARDIOGRAM (TEE);  Surgeon: Melrose Nakayama, MD;  Location: [place];  Service: Open Heart Surgery;  Laterality: N/A;  . ULTRASOUND GUIDANCE FOR VASCULAR ACCESS  05/05/2018   Procedure: Ultrasound Guidance For Vascular Access;  Surgeon: Leonie Man, MD;  Location: [place] [facility];  Service: Cardiovascular;;     Current Meds  Medication Sig  . aspirin EC 81 MG EC tablet Take 1 tablet (81 mg total) by mouth daily.  Marland Kitchen atorvastatin (LIPITOR) 80 MG tablet TAKE 1 TABLET BY MOUTH EVERY DAY AT 6 PM  . Blood Glucose Monitoring Suppl (ONE TOUCH ULTRA 2) w/Device KIT   . carvedilol (COREG) 25 MG tablet Take 1 tablet (25 mg total) by mouth 2 (two) times daily.  . Lancets (ONETOUCH DELICA PLUS [id number]) Lely   . lisinopril (ZESTRIL) 5 MG tablet Take 5 mg by mouth daily.  Glory Rosebush ULTRA test strip   . pantoprazole (PROTONIX) 20 MG tablet Take 1 tablet (20 mg total) by mouth daily.     Allergies:   Patient has no known allergies.   Social History   Tobacco Use  . Smoking status: Former Research scientist (life sciences)  . Smokeless tobacco: Never Used  Substance Use Topics  . Alcohol use: Never  . Drug use: Never     Family Hx: The patient's family history includes CAD in his brother; CVA in his father.  ROS:   Please see the history of present illness.      All other systems reviewed and are negative.   Prior CV studies:   The following studies were reviewed today:  Echo 02/07/2019. The left ventricle has moderate-severely reduced systolic function, with an ejection fraction of 25-30%. The cavity size was normal. Left ventricular diastolic Doppler parameters are consistent with impaired relaxation. Elevated left ventricular  end-diastolic pressure Left ventricular diffuse  hypokinesis. Probable apical akinesis. Patient refused definity contrast study therefore cannot rule out apical thrombus.  2. The average left ventricular global longitudinal strain is -8.8 %.  3. The aortic valve is tricuspid. Aortic valve regurgitation is mild by color flow Doppler.  4. The right ventricle has normal systolic function. The cavity was normal. There is no increase in right ventricular wall thickness. Right ventricular systolic pressure could not be assessed.  5. S/P Mitral valve repair. There is mild mitral regurgitation and mild mitral stenosis with mean MV gradient 64mHg.  6. There is mild dilatation of the aortic root measuring 41 mm.  7. The interatrial septum appears to be lipomatous.   FINDINGS  Left Ventricle: The left ventricle has severely reduced systolic function, with an ejection fraction of 25-30%. The cavity size was normal. There is no increase in left ventricular wall thickness. Left ventricular diastolic Doppler parameters are  consistent with impaired relaxation. Elevated left  ventricular end-diastolic pressure Left ventricular diffuse hypokinesis. The average left ventricular global longitudinal strain is -8.8 %.   Right Ventricle: The right ventricle has normal systolic function. The cavity was normal. There is no increase in right ventricular wall thickness. Right ventricular systolic pressure could not be assessed.   Left Atrium: Left atrial size was normal in size.   Right Atrium: Right atrial size was normal in size. Right atrial pressure is estimated at 3 mmHg.   Interatrial Septum: No atrial level shunt detected by color flow Doppler. Increased thickness of the atrial septum sparing the fossa ovalis consistent with The interatrial septum appears to be lipomatous.   Pericardium: There is no evidence of pericardial effusion.   Mitral Valve: The mitral valve has been repaired/replaced.Mitral valve regurgitation is mild by color flow Doppler. S/P Mitral  valve repair. There is mild mitral regurgitation and mild mitral stenosis with mean MV gradient 45mHg.   Tricuspid Valve: The tricuspid valve is normal in structure. Tricuspid valve regurgitation was not visualized by color flow Doppler.   Aortic Valve: The aortic valve is tricuspid Aortic valve regurgitation is mild by color flow Doppler.   Pulmonic Valve: The pulmonic valve was normal in structure. Pulmonic valve regurgitation is not visualized by color flow Doppler.   Aorta: The aorta is abnormal in size and structure. There is mild dilatation of the aortic root measuring 41 mm.   Venous: The inferior vena cava measures 0.60 cm, is normal in size with greater than 50% respiratory variability.   Compared to previous exam: 08/09/18 EF 25-30%.     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  There is severe (4+) mitral regurgitation.  SEVERE MULTIVESSEL DISEASE  Prox RCA to Mid RCA lesion is 90% stenosed. Mid RCA to Dist RCA lesion is 100% stenosed with 100% stenosed side branch in Post Atrio.  Prox LAD lesion is 99% stenosed. Mid LAD-1 lesion is 80% stenosed. Mid LAD-2 lesion is 90% stenosed. Dist LAD lesion is 85% stenosed.  Prox Cx lesion is 95% stenosed. 2nd Mrg lesion is 90% stenosed with 90% stenosed side branch in Lat 2nd Mrg.   SUMMARY  Severe multivessel CAD: Diffuse proximal RCA disease with mid 100% CTO filling with bridging collaterals and right-to-left collaterals, separate ostium circumflex with proximal 95% stenosis and bifurcation 90% stenosis of OM1, 99% mid LAD after very large septal perforator trunk (perfusing PDA) with bridging collaterals filling mid to distal LAD with a large diagonal branch and diffuse disease throughout the distal LAD.  Moderate  pulmonary hypertension likely secondary to pulmonary venous congestion and mitral regurgitation.  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     RECOMMENDATION  Return to nursing unit for TR band removal.  Continue aggressive CHF management  The patient would likely best benefit from CABG plus possible mitral valve repair. --We will consult CVTS  Will place on IV heparin 8 hours after sheath removal  Consider viability study as part of assessment for revascularization options.  Percutaneous options would be limited and extremely high risk given his low output and multivessel disease.  Would likely need support with Impella  Recommend  Aspirin 12m daily for severe CAD.    Echo 05/04/2018 Study Conclusions   - Left ventricle: The cavity size was moderately dilated. Systolic   function was severely reduced. The estimated ejection fraction   was in the range of 20% to 25%. Indeterminate diastolic function.   Severe diffuse hypokinesis. - Regional wall motion abnormality: Akinesis of the mid   inferolateral, basal-mid anterolateral, and apical lateral   myocardium. - Aortic valve: Transvalvular velocity was within the normal range.   There was no stenosis. There was no regurgitation. - Mitral valve: Mitral valve leaflet tenting. There was moderate   regurgitation. The PISA quantitation may slightly underestimate   severity, with one jet more central, and one jet eccentric and   anteriorly directed (clip 71). - Left atrium: The atrium was mildly dilated. - Right ventricle: The cavity size was mildly dilated. Wall   thickness was normal. Systolic function was moderately reduced. - Tricuspid valve: There was trivial  regurgitation. - Inferior vena cava: The vessel was dilated. The respirophasic   diameter changes were blunted (< 50%), consistent with elevated   central venous pressure. - Pericardium, extracardiac: Pleural effusion.      Abd CT 02/27/19 IMPRESSION: 1. Obstructing 2 millimeter calculus at the left ureterovesical junction. LEFT hydronephrosis and perinephric stranding. 2. Nonobstructing 2 mm calculus in the LOWER pole of the LEFT kidney. 3. Layering high attenuation material within the urinary bladder, suspicious for small stones or recent contrast administration. 4. Infrarenal abdominal aortic aneurysm measuring 4.6 cm. Recommend followup by abdomen and pelvis CTA in 6 months, and vascular surgery referral/consultation if not already obtained. This recommendation follows ACR consensus guidelines: White Paper of the ACR Incidental Findings Committee II on Vascular Findings. J Am Coll Radiol 2013; 10:789-794. Aortic aneurysm NOS (ICD10-I71.9) 5. Faintly radiopaque gallstone, gallstones, or sludge. 6. Coronary artery disease. Status post mitral annuloplasty. 7. Colonic diverticulosis. 8. Moderate stool burden. 9. Suspect hydrocele the RIGHT hemiscrotum. 10. Aortic Atherosclerosis (ICD10-I70.0) and Emphysema (ICD10-J43.9).      Labs/Other Tests and Data Reviewed:    EKG:  No ECG reviewed.  Recent Labs: 02/28/2019: ALT 20 03/01/2019: BUN 9; Creatinine, Ser 1.00; Hemoglobin 12.2; Platelets 165; Potassium 3.9; Sodium 142   Recent Lipid Panel Lab Results  Component Value Date/Time   CHOL 122 08/03/2018 08:19 AM   TRIG 76 08/03/2018 08:19 AM   HDL 46 08/03/2018 08:19 AM   CHOLHDL 2.7 08/03/2018 08:19 AM   CHOLHDL 4.2 05/04/2018 04:33 AM   LDLCALC 61 08/03/2018 08:19 AM    Wt Readings from Last 3 Encounters:  08/02/19 179 lb (81.2 kg)  02/22/19 173 lb (78.5 kg)  12/14/18 168 lb (76.2 kg)     Objective:    Vital Signs:  BP 137/77   Pulse (!) 56   Ht '5\' 10"'  (1.778 m)    Wt 179 lb (81.2 kg)   BMI 25.68 kg/m    VITAL SIGNS:  reviewed  ASSESSMENT & PLAN:    CAD status post CABG x5 and mitral valve repair 04/2018 on Lipitor and aspirin-no angina   Ischemic cardiomyopathy ejection fraction 25 to 30% on repeat echo 02/07/19. low-dose lisinopril switched to entresto but patient can't afford. Dr. NMeda Coffeerecommends referral to EP for ICD.  Patient canceled several appointments with EP. Patient asymptomatic and wants to increase activity.will check limited echo next month. If LV still down he's will to see EPS    Chronic systolic CHF compensated   Essential hypertension controlled   Hyperlipidemia on  Lipitor     GERD and excessive gas improved with green tea.    AAA 4.6 cm found incidentally on CT 02/27/2019 recommended follow-up CTA in 6 months -will order and vascular surgery referral.   Claudication symptoms but doesn't want arterial dopplers at this time.     COVID-19 Education: The signs and symptoms of COVID-19 were discussed with the patient and how to seek care for testing (follow up with PCP or arrange E-visit).   The importance of social distancing was discussed today.  Time:   Today, I have spent 14 minutes with the patient with telehealth technology discussing the above problems.     Medication Adjustments/Labs and Tests Ordered: Current medicines are reviewed at length with the patient today.  Concerns regarding medicines are outlined above.   Tests Ordered: No orders of the defined types were placed in this encounter.   Medication Changes: No orders of the defined types were placed in this encounter.   Follow Up:  In Person in 2 month(s) Dr. Meda Coffee  Signed, Ermalinda Barrios, PA-C  08/02/2019 2:28 PM    [place]

## 2019-08-02 ENCOUNTER — Other Ambulatory Visit: Payer: Self-pay

## 2019-08-02 ENCOUNTER — Encounter: Payer: Self-pay | Admitting: Physician Assistant

## 2019-08-02 ENCOUNTER — Telehealth (INDEPENDENT_AMBULATORY_CARE_PROVIDER_SITE_OTHER): Payer: Medicare HMO | Admitting: Physician Assistant

## 2019-08-02 VITALS — BP 137/77 | HR 56 | Ht 70.0 in | Wt 179.0 lb

## 2019-08-02 DIAGNOSIS — I714 Abdominal aortic aneurysm, without rupture, unspecified: Secondary | ICD-10-CM

## 2019-08-02 DIAGNOSIS — I25810 Atherosclerosis of coronary artery bypass graft(s) without angina pectoris: Secondary | ICD-10-CM | POA: Diagnosis not present

## 2019-08-02 DIAGNOSIS — I5022 Chronic systolic (congestive) heart failure: Secondary | ICD-10-CM | POA: Diagnosis not present

## 2019-08-02 DIAGNOSIS — I10 Essential (primary) hypertension: Secondary | ICD-10-CM

## 2019-08-02 DIAGNOSIS — Z01812 Encounter for preprocedural laboratory examination: Secondary | ICD-10-CM

## 2019-08-02 DIAGNOSIS — E785 Hyperlipidemia, unspecified: Secondary | ICD-10-CM

## 2019-08-02 DIAGNOSIS — K219 Gastro-esophageal reflux disease without esophagitis: Secondary | ICD-10-CM | POA: Diagnosis not present

## 2019-08-02 DIAGNOSIS — I255 Ischemic cardiomyopathy: Secondary | ICD-10-CM

## 2019-08-02 DIAGNOSIS — I739 Peripheral vascular disease, unspecified: Secondary | ICD-10-CM | POA: Diagnosis not present

## 2019-08-02 NOTE — Patient Instructions (Signed)
Medication Instructions:  Your physician recommends that you continue on your current medications as directed. Please refer to the Current Medication list given to you today.  If you need a refill on your cardiac medications before your next appointment, please call your pharmacy.   Lab work: Your physician recommends that you return for lab work Designer, jewellery) prior to CT scan   If you have labs (blood work) drawn today and your tests are completely normal, you will receive your results only by: Marland Kitchen MyChart Message (if you have MyChart) OR . A paper copy in the mail If you have any lab test that is abnormal or we need to change your treatment, we will call you to review the results.  Testing/Procedures: Your provider recommends you have a CT of the abdomen. Non-Cardiac CT Angiography (CTA), is a special type of CT scan that uses a computer to produce multi-dimensional views of major blood vessels throughout the body. In CT angiography, a contrast material is injected through an IV to help visualize the blood vessels  Your physician has requested that you have a limited echocardiogram in March 2021. Echocardiography is a painless test that uses sound waves to create images of your heart. It provides your doctor with information about the size and shape of your heart and how well your heart's chambers and valves are working. This procedure takes approximately one hour. There are no restrictions for this procedure.   Follow-Up: . You have been referred to Vascular Surgery for your Abdominal Aortic Aneurysm (AAA).  . Follow up with Dr. Delton See on 10/06/19 at 2:20 PM  Any Other Special Instructions Will Be Listed Below (If Applicable).

## 2019-08-03 DIAGNOSIS — R69 Illness, unspecified: Secondary | ICD-10-CM | POA: Diagnosis not present

## 2019-08-07 ENCOUNTER — Telehealth: Payer: Self-pay | Admitting: *Deleted

## 2019-08-07 NOTE — Telephone Encounter (Signed)
  Claire Shown Dolce Male, 69 y.o., 20-Nov-1950 MRN:  [id number] Phone:  [phone] Judie Petit) PCP:  Lorenda Ishihara, MD Coverage:  Monia Pouch Medicare/[employer] Medicare Hmo/Ppo Next Appt With Radiology (LBCT-CT 1) 08/21/2019 at 2:00 PM RE: CT order Received: 3 days ago Message Contents  Dyann Kief, PA-C  [place] Citizen, [place]  Yes thank you       Previous Messages   ----- Message -----  From: [place] Citizen, NT  Sent: 08/03/2019 11:35 AM EST  To: Dyann Kief, PA-C  Subject: CT order                     Tilda Franco like you placed an order for CT Abdomen w and wo to FU Abdominal Aortic aneurysm. The radiologist recommends on report for CT Renal Stone study from 02/27/2019 to obtain CTA abdomen and pelvis.   Can I change to order from CT Abdomen W and WO to CT Angio Abdomen Pelvis W?   Thank you   Misty Stanley

## 2019-08-09 ENCOUNTER — Other Ambulatory Visit: Payer: Medicare HMO

## 2019-08-09 ENCOUNTER — Telehealth: Payer: Self-pay | Admitting: Physician Assistant

## 2019-08-09 NOTE — Telephone Encounter (Signed)
Stacy in CT spoke with patient.

## 2019-08-09 NOTE — Telephone Encounter (Signed)
New message:    Patient calling asking can some call him concering his medication and up coming apt. Please call

## 2019-08-16 ENCOUNTER — Other Ambulatory Visit: Payer: Self-pay

## 2019-08-16 ENCOUNTER — Other Ambulatory Visit: Payer: Medicare HMO | Admitting: *Deleted

## 2019-08-16 DIAGNOSIS — I714 Abdominal aortic aneurysm, without rupture, unspecified: Secondary | ICD-10-CM

## 2019-08-16 DIAGNOSIS — Z01812 Encounter for preprocedural laboratory examination: Secondary | ICD-10-CM | POA: Diagnosis not present

## 2019-08-16 LAB — BASIC METABOLIC PANEL
BUN/Creatinine Ratio: 11 (ref 10–24)
BUN: 11 mg/dL (ref 8–27)
CO2: 25 mmol/L (ref 20–29)
Calcium: 9.1 mg/dL (ref 8.6–10.2)
Chloride: 102 mmol/L (ref 96–106)
Creatinine, Ser: 1.03 mg/dL (ref 0.76–1.27)
GFR calc Af Amer: 87 mL/min/{1.73_m2} (ref >59)
GFR calc non Af Amer: 75 mL/min/{1.73_m2} (ref >59)
Glucose: 142 mg/dL — ABNORMAL HIGH (ref 65–99)
Potassium: 4.5 mmol/L (ref 3.5–5.2)
Sodium: 138 mmol/L (ref 134–144)

## 2019-08-21 ENCOUNTER — Ambulatory Visit (INDEPENDENT_AMBULATORY_CARE_PROVIDER_SITE_OTHER)
Admission: RE | Admit: 2019-08-21 | Discharge: 2019-08-21 | Disposition: A | Discharge status: Home / Self Care | Payer: Medicare HMO | Source: Ambulatory Visit | Attending: Physician Assistant | Admitting: Physician Assistant

## 2019-08-21 ENCOUNTER — Other Ambulatory Visit: Payer: Self-pay

## 2019-08-21 ENCOUNTER — Ambulatory Visit (HOSPITAL_COMMUNITY): Payer: Medicare HMO | Attending: Physician Assistant

## 2019-08-21 DIAGNOSIS — I714 Abdominal aortic aneurysm, without rupture, unspecified: Secondary | ICD-10-CM

## 2019-08-21 DIAGNOSIS — I255 Ischemic cardiomyopathy: Secondary | ICD-10-CM | POA: Insufficient documentation

## 2019-08-21 MED ORDER — IOHEXOL 300 MG/ML  SOLN
100.0000 mL | Freq: Once | INTRAMUSCULAR | Status: AC | PRN
  Administered 2019-08-21: 100 mL via INTRAVENOUS

## 2019-08-22 ENCOUNTER — Other Ambulatory Visit: Payer: Self-pay | Admitting: Cardiology

## 2019-09-04 ENCOUNTER — Telehealth (HOSPITAL_COMMUNITY): Payer: Self-pay

## 2019-09-04 NOTE — Telephone Encounter (Signed)

## 2019-09-05 ENCOUNTER — Encounter: Payer: Self-pay | Admitting: Vascular Surgery

## 2019-09-05 ENCOUNTER — Other Ambulatory Visit: Payer: Self-pay

## 2019-09-05 ENCOUNTER — Ambulatory Visit: Payer: Medicare HMO | Admitting: Vascular Surgery

## 2019-09-05 VITALS — BP 145/80 | HR 58 | Temp 98.2°F | Resp 16 | Ht 69.0 in | Wt 184.0 lb

## 2019-09-05 DIAGNOSIS — I714 Abdominal aortic aneurysm, without rupture, unspecified: Secondary | ICD-10-CM

## 2019-09-05 NOTE — Progress Notes (Signed)
Patient name: Michael Banks MRN: [id number] DOB: 01/01/1951 Sex: male  REASON FOR CONSULT: Evaluate abdominal aortic aneurysm  HPI: Michael Banks is a 69 y.o. male, with history of coronary artery disease status post CABG and mitral valve repair, hypertension, hyperlipidemia, tobacco abuse, CHF with a EF of 25 to 30% presents for evaluation of known abdominal aortic aneurysm.  He states this was initially discovered last year by his urologist.  He had a stone study 02/27/2019 with noted 4.6 cm infrarenal abdominal aortic aneurysm.  He denies any associated abdominal or back pain.  He had no abdominal surgery.  He had a repeat CTA by his cardiologist on 08/21/2019 that shows no change with stable 4.6 cm infrarenal abdominal aortic aneurysm.  He states his father died of a intracranial aneurysm that ruptured.  States he has been fairly sedentary during the Covid pandemic.  Past Medical History:  Diagnosis Date  . Dilated cardiomyopathy ([place])   . Hydronephrosis   . Hyperglycemia   . Hypertension   . Kidney stone     Past Surgical History:  Procedure Laterality Date  . CORONARY ARTERY BYPASS GRAFT N/A 05/13/2018   Procedure: CORONARY ARTERY BYPASS GRAFTING (CABG) times _five__ using left internal mammary artery and right  greater saphenous vein harvested endoscopically.;  Surgeon: Melrose Nakayama, MD;  Location: [place];  Service: Open Heart Surgery;  Laterality: N/A;  . KNEE ARTHROSCOPY    . MITRAL VALVE REPAIR N/A 05/13/2018   Procedure: Mitral Valve Repair;  Surgeon: Melrose Nakayama, MD;  Location: [place];  Service: Open Heart Surgery;  Laterality: N/A;  . NOSE SURGERY    . RIGHT/LEFT HEART CATH AND CORONARY ANGIOGRAPHY N/A 05/05/2018   Procedure: RIGHT/LEFT HEART CATH AND CORONARY ANGIOGRAPHY;  Surgeon: Leonie Man, MD;  Location: [facility];  Service: Cardiovascular;  Laterality: N/A;  . TEE WITHOUT CARDIOVERSION N/A 05/13/2018   Procedure: TRANSESOPHAGEAL  ECHOCARDIOGRAM (TEE);  Surgeon: Melrose Nakayama, MD;  Location: [facility];  Service: Open Heart Surgery;  Laterality: N/A;  . ULTRASOUND GUIDANCE FOR VASCULAR ACCESS  05/05/2018   Procedure: Ultrasound Guidance For Vascular Access;  Surgeon: Leonie Man, MD;  Location: [facility];  Service: Cardiovascular;;    Family History  Problem Relation Age of Onset  . CVA Father        died of "cerebral hemorrhage"  . CAD Brother        died of "heart attack" at age 48    SOCIAL HISTORY: Social History   Socioeconomic History  . Marital status: Single    Spouse name: Not on file  . Number of children: Not on file  . Years of education: Not on file  . Highest education level: Not on file  Occupational History  . Not on file  Tobacco Use  . Smoking status: Former Research scientist (life sciences)  . Smokeless tobacco: Never Used  Substance and Sexual Activity  . Alcohol use: Never  . Drug use: Never  . Sexual activity: Not on file  Other Topics Concern  . Not on file  Social History Narrative  . Not on file   Social Determinants of Health   Financial Resource Strain:   . Difficulty of Paying Living Expenses:   Food Insecurity:   . Worried About Charity fundraiser in the Last Year:   . Arboriculturist in the Last Year:   Transportation Needs:   . Film/video editor (Medical):   Marland Kitchen  Lack of Transportation (Non-Medical):   Physical Activity:   . Days of Exercise per Week:   . Minutes of Exercise per Session:   Stress:   . Feeling of Stress :   Social Connections:   . Frequency of Communication with Friends and Family:   . Frequency of Social Gatherings with Friends and Family:   . Attends Religious Services:   . Active Member of Clubs or Organizations:   . Attends Archivist Meetings:   Marland Kitchen Marital Status:   Intimate Partner Violence:   . Fear of Current or Ex-Partner:   . Emotionally Abused:   Marland Kitchen Physically Abused:   . Sexually Abused:     No Known Allergies   Current Outpatient Medications  Medication Sig Dispense Refill  . aspirin EC 81 MG EC tablet Take 1 tablet (81 mg total) by mouth daily.    Marland Kitchen atorvastatin (LIPITOR) 80 MG tablet TAKE 1 TABLET BY MOUTH EVERY DAY AT 6 PM 30 tablet 1  . Blood Glucose Monitoring Suppl (ONE TOUCH ULTRA 2) w/Device KIT     . carvedilol (COREG) 25 MG tablet Take 1 tablet (25 mg total) by mouth 2 (two) times daily. 180 tablet 3  . Lancets (ONETOUCH DELICA PLUS [id number]) MISC     . lisinopril (ZESTRIL) 5 MG tablet TAKE 1 TABLET BY MOUTH EVERY DAY 90 tablet 3  . ONETOUCH ULTRA test strip     . pantoprazole (PROTONIX) 20 MG tablet Take 1 tablet (20 mg total) by mouth daily. 90 tablet 3   No current facility-administered medications for this visit.    REVIEW OF SYSTEMS:  '[X]'  denotes positive finding, '[ ]'  denotes negative finding Cardiac  Comments:  Chest pain or chest pressure:    Shortness of breath upon exertion:    Short of breath when lying flat:    Irregular heart rhythm:        Vascular    Pain in calf, thigh, or hip brought on by ambulation: x   Pain in feet at night that wakes you up from your sleep:     Blood clot in your veins:    Leg swelling:         Pulmonary    Oxygen at home:    Productive cough:     Wheezing:         Neurologic    Sudden weakness in arms or legs:     Sudden numbness in arms or legs:     Sudden onset of difficulty speaking or slurred speech:    Temporary loss of vision in one eye:     Problems with dizziness:         Gastrointestinal    Blood in stool:     Vomited blood:         Genitourinary    Burning when urinating:     Blood in urine:        Psychiatric    Major depression:         Hematologic    Bleeding problems:    Problems with blood clotting too easily:        Skin    Rashes or ulcers:        Constitutional    Fever or chills:      PHYSICAL EXAM: Vitals:   09/05/19 1154  BP: (!) 145/80  Pulse: (!) 58  Resp: 16  Temp: 98.2 F (36.8 C)   TempSrc: Temporal  SpO2: 100%  Weight: 184 lb (  83.5 kg)  Height: '5\' 9"'  (1.753 m)    GENERAL: The patient is a well-nourished male, in no acute distress. The vital signs are documented above. CARDIAC: There is a regular rate and rhythm.  VASCULAR:  Palpable femoral pulses bilaterally Palpable dorsalis pedis pulses bilaterally and posterior tibial pulses bilaterally PULMONARY: There is good air exchange bilaterally without wheezing or rales. ABDOMEN: Soft and non-tender with normal pitched bowel sounds. No pain with palpation of aneurysm. MUSCULOSKELETAL: There are no major deformities or cyanosis. NEUROLOGIC: No focal weakness or paresthesias are detected. SKIN: There are no ulcers or rashes noted. PSYCHIATRIC: The patient has a normal affect.  DATA:   CTA abdomen pelvis on my review there is a 4.6 cm infrarenal abdominal aortic aneurysm that looks relatively unchanged from 02/27/2019.  Assessment/Plan:  69 year old male presents with 4.6 cm infrarenal abdominal aortic aneurysm.  I reviewed the CT imaging with the patient and I discussed aneurysmal disease in detail.  We discussed that the current guidelines are to repair these at greater than 5.5 cm in men unless there is rapid growth in the aneurysm or it becomes symptomatic.  Discussed that based on his current size would not recommend any surgical repair and would instead recommend surveillance.  Plan to see him back in 6 months.  Discused the importance of smoking cessation as well as blood pressure management.   Marty Heck, MD [facility] Office: [phone]

## 2019-09-06 ENCOUNTER — Other Ambulatory Visit: Payer: Self-pay | Admitting: *Deleted

## 2019-09-06 DIAGNOSIS — I714 Abdominal aortic aneurysm, without rupture, unspecified: Secondary | ICD-10-CM

## 2019-10-06 ENCOUNTER — Other Ambulatory Visit: Payer: Self-pay

## 2019-10-06 ENCOUNTER — Encounter: Payer: Self-pay | Admitting: Cardiology

## 2019-10-06 ENCOUNTER — Ambulatory Visit: Payer: Medicare HMO | Admitting: Cardiology

## 2019-10-06 VITALS — BP 126/74 | HR 62 | Ht 70.0 in | Wt 188.4 lb

## 2019-10-06 DIAGNOSIS — I25810 Atherosclerosis of coronary artery bypass graft(s) without angina pectoris: Secondary | ICD-10-CM

## 2019-10-06 DIAGNOSIS — I255 Ischemic cardiomyopathy: Secondary | ICD-10-CM

## 2019-10-06 DIAGNOSIS — I509 Heart failure, unspecified: Secondary | ICD-10-CM | POA: Diagnosis not present

## 2019-10-06 DIAGNOSIS — I340 Nonrheumatic mitral (valve) insufficiency: Secondary | ICD-10-CM | POA: Diagnosis not present

## 2019-10-06 NOTE — Patient Instructions (Signed)
Medication Instructions:  NO CHANGES *If you need a refill on your cardiac medications before your next appointment, please call your pharmacy*   Lab Work: NONE If you have labs (blood work) drawn today and your tests are completely normal, you will receive your results only by: Marland Kitchen MyChart Message (if you have MyChart) OR . A paper copy in the mail If you have any lab test that is abnormal or we need to change your treatment, we will call you to review the results.   Testing/Procedures: NONE   Follow-Up: At [facility], you and your health needs are our priority.  As part of our continuing mission to provide you with exceptional heart care, we have created designated Provider Care Teams.  These Care Teams include your primary Cardiologist (physician) and Advanced Practice Providers (APPs -  Physician Assistants and Nurse Practitioners) who all work together to provide you with the care you need, when you need it.  We recommend signing up for the patient portal called "MyChart".  Sign up information is provided on this After Visit Summary.  MyChart is used to connect with patients for Virtual Visits (Telemedicine).  Patients are able to view lab/test results, encounter notes, upcoming appointments, etc.  Non-urgent messages can be sent to your provider as well.   To learn more about what you can do with MyChart, go to [email or web address].    Your next appointment:   4 month(s)  The format for your next appointment:   In Person  Provider:   Tobias Alexander, MD   Other Instructions

## 2019-10-06 NOTE — Progress Notes (Signed)
Cardiology Office Note    Date:  10/06/2019   ID:  Michael Banks, DOB 09-07-1950, MRN [id number]  PCP:  Leeroy Cha, MD  Cardiologist: Ena Dawley, MD EPS: None  Reason for visit: Months follow-up  History of Present Illness:  Michael Banks is a 69 y.o. male  with history of hypertension, HLD, tobacco abuse admitted 04/2018 with shortness of breath and orthopnea.  Echo showed severely reduced LV function ejection fraction 20 to 25% with moderate MR.  Cardiac catheterization revealed severe CAD and severe MR.  Mild cardio viability study revealed viability in 12 out of 17 segments.  He underwent CABG x5 and MV repair 05/13/2018.  Briefly had atrial fibrillation postop converted to normal sinus rhythm with amiodarone.  He is to be maintained on Coumadin and aspirin per CVTS.  No ace inhibitor or ARB or Spironolactone given because of elevated creatinine in the hospital.  Last creatinine we have in the system is 0.88 on 05/19/2018.  Will need lifelong SBE prophylaxis.  The patient has been doing great, he was not able to get Delene Loll because of high cost, he has had repeated echocardiogram that showed LVEF 25 to 30%.  The patient has started to exercise, he walks frequently and denies any chest pain or shortness of breath, he is NYHA class IIa.  He denies any palpitation dizziness or syncope, no lower extremity edema.  Past Medical History:  Diagnosis Date  . Dilated cardiomyopathy ([place])   . Hydronephrosis   . Hyperglycemia   . Hypertension   . Kidney stone     Past Surgical History:  Procedure Laterality Date  . CORONARY ARTERY BYPASS GRAFT N/A 05/13/2018   Procedure: CORONARY ARTERY BYPASS GRAFTING (CABG) times _five__ using left internal mammary artery and right  greater saphenous vein harvested endoscopically.;  Surgeon: Melrose Nakayama, MD;  Location: [facility];  Service: Open Heart Surgery;  Laterality: N/A;  . KNEE ARTHROSCOPY    . MITRAL VALVE REPAIR N/A  05/13/2018   Procedure: Mitral Valve Repair;  Surgeon: Melrose Nakayama, MD;  Location: [place];  Service: Open Heart Surgery;  Laterality: N/A;  . NOSE SURGERY    . RIGHT/LEFT HEART CATH AND CORONARY ANGIOGRAPHY N/A 05/05/2018   Procedure: RIGHT/LEFT HEART CATH AND CORONARY ANGIOGRAPHY;  Surgeon: Leonie Man, MD;  Location: [facility];  Service: Cardiovascular;  Laterality: N/A;  . TEE WITHOUT CARDIOVERSION N/A 05/13/2018   Procedure: TRANSESOPHAGEAL ECHOCARDIOGRAM (TEE);  Surgeon: Melrose Nakayama, MD;  Location: [facility];  Service: Open Heart Surgery;  Laterality: N/A;  . ULTRASOUND GUIDANCE FOR VASCULAR ACCESS  05/05/2018   Procedure: Ultrasound Guidance For Vascular Access;  Surgeon: Leonie Man, MD;  Location: [facility];  Service: Cardiovascular;;    Current Medications: Current Meds  Medication Sig  . aspirin EC 81 MG EC tablet Take 1 tablet (81 mg total) by mouth daily.  Marland Kitchen atorvastatin (LIPITOR) 80 MG tablet TAKE 1 TABLET BY MOUTH EVERY DAY AT 6 PM  . Blood Glucose Monitoring Suppl (ONE TOUCH ULTRA 2) w/Device KIT   . carvedilol (COREG) 25 MG tablet Take 1 tablet (25 mg total) by mouth 2 (two) times daily.  . Lancets (ONETOUCH DELICA PLUS [id number]) [place]   . lisinopril (ZESTRIL) 5 MG tablet TAKE 1 TABLET BY MOUTH EVERY DAY  . ONETOUCH ULTRA test strip      Allergies:   Patient has no known allergies.   Social History   Socioeconomic History  .  Marital status: Single    Spouse name: Not on file  . Number of children: Not on file  . Years of education: Not on file  . Highest education level: Not on file  Occupational History  . Not on file  Tobacco Use  . Smoking status: Former Research scientist (life sciences)  . Smokeless tobacco: Never Used  Substance and Sexual Activity  . Alcohol use: Never  . Drug use: Never  . Sexual activity: Not on file  Other Topics Concern  . Not on file  Social History Narrative  . Not on file   Social Determinants of Health    Financial Resource Strain:   . Difficulty of Paying Living Expenses:   Food Insecurity:   . Worried About Charity fundraiser in the Last Year:   . Arboriculturist in the Last Year:   Transportation Needs:   . Film/video editor (Medical):   Marland Kitchen Lack of Transportation (Non-Medical):   Physical Activity:   . Days of Exercise per Week:   . Minutes of Exercise per Session:   Stress:   . Feeling of Stress :   Social Connections:   . Frequency of Communication with Friends and Family:   . Frequency of Social Gatherings with Friends and Family:   . Attends Religious Services:   . Active Member of Clubs or Organizations:   . Attends Archivist Meetings:   Marland Kitchen Marital Status:      Family History:  The patient's family history includes CAD in his brother; CVA in his father.   ROS:   Please see the history of present illness.    Review of Systems  Constitution: Positive for weight loss.  HENT: Negative.   Cardiovascular: Negative.   Respiratory: Negative.   Endocrine: Negative.   Hematologic/Lymphatic: Negative.   Musculoskeletal: Negative.   Gastrointestinal: Negative.   Genitourinary: Negative.   Neurological: Negative.    All other systems reviewed and are negative.   PHYSICAL EXAM:   VS:  BP 126/74   Pulse 62   Ht 5' 10" (1.778 m)   Wt 188 lb 6.4 oz (85.5 kg)   SpO2 96%   BMI 27.03 kg/m   Physical Exam  GEN: Thin, in no acute distress  Neck: no JVD, carotid bruits, or masses Cardiac:RRR; no murmurs, rubs, or gallops  Respiratory: Decreased breath sounds but clear to auscultation bilaterally, normal work of breathing GI: soft, nontender, nondistended, + BS Ext: without cyanosis, clubbing, or edema, dorsalis pedis pulse felt on the right but not posterior tibialis pulse, on the left both pulses are felt.  Neuro:  Alert and Oriented x 3 Psych: euthymic mood, full affect  Wt Readings from Last 3 Encounters:  10/06/19 188 lb 6.4 oz (85.5 kg)  09/05/19  184 lb (83.5 kg)  08/02/19 179 lb (81.2 kg)      Studies/Labs Reviewed:   EKG:  EKG is not ordered today.  Recent Labs: 02/28/2019: ALT 20 03/01/2019: Hemoglobin 12.2; Platelets 165 08/16/2019: BUN 11; Creatinine, Ser 1.03; Potassium 4.5; Sodium 138   Lipid Panel    Component Value Date/Time   CHOL 122 08/03/2018 0819   TRIG 76 08/03/2018 0819   HDL 46 08/03/2018 0819   CHOLHDL 2.7 08/03/2018 0819   CHOLHDL 4.2 05/04/2018 0433   VLDL 11 05/04/2018 0433   LDLCALC 61 08/03/2018 0819    Additional studies/ records that were reviewed today include:   Cardiac cath 05/05/2018  ISCHEMIC CARDIOMYOPATHY  Hemodynamic findings consistent with  moderate pulmonary hypertension -secondary to pulmonary venous hypertension  LV end diastolic pressure and pulmonary capillary wedge pressure are moderately elevated.  There is severe (4+) mitral regurgitation.  SEVERE MULTIVESSEL DISEASE  Prox RCA to Mid RCA lesion is 90% stenosed. Mid RCA to Dist RCA lesion is 100% stenosed with 100% stenosed side branch in Post Atrio.  Prox LAD lesion is 99% stenosed. Mid LAD-1 lesion is 80% stenosed. Mid LAD-2 lesion is 90% stenosed. Dist LAD lesion is 85% stenosed.  Prox Cx lesion is 95% stenosed. 2nd Mrg lesion is 90% stenosed with 90% stenosed side branch in Lat 2nd Mrg.   SUMMARY  Severe multivessel CAD: Diffuse proximal RCA disease with mid 100% CTO filling with bridging collaterals and right-to-left collaterals, separate ostium circumflex with proximal 95% stenosis and bifurcation 90% stenosis of OM1, 99% mid LAD after very large septal perforator trunk (perfusing PDA) with bridging collaterals filling mid to distal LAD with a large diagonal branch and diffuse disease throughout the distal LAD.  Moderate pulmonary hypertension likely secondary to pulmonary venous congestion and mitral regurgitation.  At least moderate-severe if not severe mitral regurgitation (likely ischemic)  Moderately elevated  LVEDP and PCWP consistent with acute diastolic heart failure in conjunction with acute systolic heart failure from Ischemic Cardiomyopathy   At least mildly reduced cardiac output of 4.8, index 2.39.     RECOMMENDATION  Return to nursing unit for TR band removal.  Continue aggressive CHF management  The patient would likely best benefit from CABG plus possible mitral valve repair. --We will consult CVTS  Will place on IV heparin 8 hours after sheath removal  Consider viability study as part of assessment for revascularization options.  Percutaneous options would be limited and extremely high risk given his low output and multivessel disease.  Would likely need support with Impella  Recommend Aspirin 76m daily for severe CAD.    Echo 05/04/2018 Study Conclusions   - Left ventricle: The cavity size was moderately dilated. Systolic   function was severely reduced. The estimated ejection fraction   was in the range of 20% to 25%. Indeterminate diastolic function.   Severe diffuse hypokinesis. - Regional wall motion abnormality: Akinesis of the mid   inferolateral, basal-mid anterolateral, and apical lateral   myocardium. - Aortic valve: Transvalvular velocity was within the normal range.   There was no stenosis. There was no regurgitation. - Mitral valve: Mitral valve leaflet tenting. There was moderate   regurgitation. The PISA quantitation may slightly underestimate   severity, with one jet more central, and one jet eccentric and   anteriorly directed (clip 71). - Left atrium: The atrium was mildly dilated. - Right ventricle: The cavity size was mildly dilated. Wall   thickness was normal. Systolic function was moderately reduced. - Tricuspid valve: There was trivial regurgitation. - Inferior vena cava: The vessel was dilated. The respirophasic   diameter changes were blunted (< 50%), consistent with elevated   central venous pressure. - Pericardium, extracardiac: Pleural  effusion.  TTE: 08/21/2019  1. Left ventricular ejection fraction, by estimation, is 25 to 30%. The  left ventricle has severely decreased function. The left ventricle  demonstrates global hypokinesis. There is mild left ventricular  hypertrophy. Left ventricular diastolic parameters  are consistent with Grade II diastolic dysfunction (pseudonormalization).  Elevated left atrial pressure. Patient refused Definity.  2. Right ventricular systolic function is mildly reduced. The right  ventricular size is mildly enlarged.  3. The mitral valve has been repaired/replaced. There is a  32 mm  prosthetic annuloplasty ring present in the mitral position. Trivial  regurgitation. Mean gradient 4 mmHg.  4. The aortic valve was not well visualized. Aortic valve regurgitation  is mild.  5. Compared to prior TTE on 01/31/19, no significant change in LV systolic  function     ASSESSMENT:    1. New onset of congestive heart failure ([place])   2. 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   3. Nonrheumatic mitral valve regurgitation   4. Ischemic cardiomyopathy     PLAN:  In order of problems listed above:  CAD status post CABG x5 and mitral valve repair 04/2018 on Lipitor,  aspirin.  He has no recurrent chest pain.  Ischemic cardiomyopathy -repeat LVEF in March 2021 was 25 to 30%, will continue low-dose lisinopril and carvedilol.  He could not afford Entresto, we suggested starting low-dose spironolactone, however he feels so good that he would like to continue the same meds for now.  The increase carvedilol at the last visit his heart rate is now much better controlled. We will repeat his echocardiogram at the next visit and if his LVEF continues to be low we will refer for ICD implantation.   Chronic systolic CHF -appears euvolemic, NYHA class IIa.  Essential hypertension on low-dose lisinopril and carvedilol.  Hyperlipidemia on Lipitor that is well-tolerated, lipids at  goal.  Status post mitral valve repair -most recent echocardiogram shows stable and normal mean transmitral gradient 4 mmHg.  Medication Adjustments/Labs and Tests Ordered: Current medicines are reviewed at length with the patient today.  Concerns regarding medicines are outlined above.  Medication changes, Labs and Tests ordered today are listed in the Patient Instructions below. Patient Instructions  Medication Instructions:  NO CHANGES *If you need a refill on your cardiac medications before your next appointment, please call your pharmacy*   Lab Work: NONE If you have labs (blood work) drawn today and your tests are completely normal, you will receive your results only by: Marland Kitchen MyChart Message (if you have MyChart) OR . A paper copy in the mail If you have any lab test that is abnormal or we need to change your treatment, we will call you to review the results.   Testing/Procedures: NONE   Follow-Up: At [facility], you and your health needs are our priority.  As part of our continuing mission to provide you with exceptional heart care, we have created designated Provider Care Teams.  These Care Teams include your primary Cardiologist (physician) and Advanced Practice Providers (APPs -  Physician Assistants and Nurse Practitioners) who all work together to provide you with the care you need, when you need it.  We recommend signing up for the patient portal called "MyChart".  Sign up information is provided on this After Visit Summary.  MyChart is used to connect with patients for Virtual Visits (Telemedicine).  Patients are able to view lab/test results, encounter notes, upcoming appointments, etc.  Non-urgent messages can be sent to your provider as well.   To learn more about what you can do with MyChart, go to [email or web address].    Your next appointment:   4 month(s)  The format for your next appointment:   In Person  Provider:   Ena Dawley, MD   Other  Instructions      Signed, Ena Dawley, MD  10/06/2019 2:57 PM    [facility] [address], [place], [place]  [address] Phone: [phone]; Fax: [phone]

## 2019-10-25 ENCOUNTER — Telehealth: Payer: Self-pay | Admitting: Cardiology

## 2019-10-25 NOTE — Telephone Encounter (Signed)
Please send him to ER

## 2019-10-25 NOTE — Telephone Encounter (Signed)
   Pt c/o of Chest Pain: STAT if CP now or developed within 24 hours  1. Are you having CP right now? Yes  2. Are you experiencing any other symptoms (ex. SOB, nausea, vomiting, sweating)? Dizzy spell yesterday, first time I ever had   3. How long have you been experiencing CP?   4. Is your CP continuous or coming and going? Coming and going  5. Have you taken Nitroglycerin? No  Pt said he wasn't sure if it's gas or his heart, he said he can't lay either side because it hurts. He wasn't sure if it's gas related because he took some gas x and didn't work. ?

## 2019-10-25 NOTE — Telephone Encounter (Signed)
Patient complaining of chest pain. Patient stated he has not been able to sleep for the past two nights. Patient stated it comes and goes. Patient stated he had chest pain when standing. Patient has history of CHF (EF 25 to 30%), CABG x 5, MV repair, and AAA. With patient's history. encouraged patient to go to ED to be evaluated. Patient agreed to plan and will go right now. Will forward to Dr. Delton See, so she is aware.

## 2019-12-15 ENCOUNTER — Ambulatory Visit: Payer: Medicare HMO | Admitting: Gastroenterology

## 2019-12-15 ENCOUNTER — Encounter: Payer: Self-pay | Admitting: Gastroenterology

## 2019-12-15 VITALS — BP 160/80 | HR 62 | Ht 69.0 in | Wt 186.6 lb

## 2019-12-15 DIAGNOSIS — R140 Abdominal distension (gaseous): Secondary | ICD-10-CM | POA: Diagnosis not present

## 2019-12-15 DIAGNOSIS — R143 Flatulence: Secondary | ICD-10-CM | POA: Diagnosis not present

## 2019-12-15 DIAGNOSIS — I255 Ischemic cardiomyopathy: Secondary | ICD-10-CM | POA: Diagnosis not present

## 2019-12-15 DIAGNOSIS — Z951 Presence of aortocoronary bypass graft: Secondary | ICD-10-CM | POA: Diagnosis not present

## 2019-12-15 NOTE — Progress Notes (Signed)
[facility] Consult Note:  History: Michael Banks 12/15/2019  Referring provider: Self-referred  Reason for consult/chief complaint: Gas (Uses Gas X for gas pains, but he said he passed a kidney stone and feels like thats what his problem was. He feels better now)   Subjective  HPI: Most recent cardiology office note from 10/06/2019 reviewed, described in detail patient's cardiac history.  Briefly, diagnosed with ischemic cardiomyopathy and severe mitral regurgitation in late 2019, underwent CABG x5 and mitral valve repair.  Brief postop atrial fibrillation, was maintained on amiodarone and Coumadin but since discontinued.  Echocardiogram 08/21/2019 with full impression as below, LVEF still severely decreased.  Mr. Michael Banks made the appointment to see [place] because he had been bothered by chronic abdominal discomfort and gas pain.  He believes that since his Coumadin was discontinued around March 2020, he developed frequent abdominal bloating with feelings of trapped gas that would rise up into his upper abdomen or lower chest and also cause chest pain.  He had increased flatulence he thinks perhaps the symptoms may have been related to a kidney stone, since he pulled out a prescription bottle that had a kidney stone in it which he says he passed recently with some improvement in symptoms.  He also was troubled by constipation with a BM every 2 or 3 days, also something which he thought may have been attributed to some changes in his cardiac meds.  Though he was getting fruits and vegetables, he recently had a daily fiber supplement, decided to do some walking or other exercise soon after meals, with that his bowel habits are now normalized and the gas and bloating have resolved.  He denies rectal bleeding.  Kimothy has never had a screening colonoscopy, says from time to time his PCP would do a stool card.   ROS:  Review of Systems  Constitutional: Positive for fatigue. Negative  for appetite change and unexpected weight change.  HENT: Negative for mouth sores and voice change.   Eyes: Negative for pain and redness.  Respiratory: Negative for cough and shortness of breath.   Cardiovascular: Negative for chest pain and palpitations.  Genitourinary: Negative for dysuria and hematuria.  Musculoskeletal: Positive for arthralgias. Negative for myalgias.  Skin: Negative for pallor and rash.  Neurological: Negative for weakness and headaches.  Hematological: Negative for adenopathy.     Past Medical History: Past Medical History:  Diagnosis Date  . Arthritis   . Diabetes ([place])   . Dilated cardiomyopathy ([place])   . Gallstones   . Hydronephrosis   . Hyperglycemia   . Hypertension   . Kidney stone   . UTI (urinary tract infection)      Past Surgical History: Past Surgical History:  Procedure Laterality Date  . CORONARY ARTERY BYPASS GRAFT N/A 05/13/2018   Procedure: CORONARY ARTERY BYPASS GRAFTING (CABG) times _five__ using left internal mammary artery and right  greater saphenous vein harvested endoscopically.;  Surgeon: Melrose Nakayama, MD;  Location: [facility];  Service: Open Heart Surgery;  Laterality: N/A;  . KNEE ARTHROSCOPY    . MITRAL VALVE REPAIR N/A 05/13/2018   Procedure: Mitral Valve Repair;  Surgeon: Melrose Nakayama, MD;  Location: [facility];  Service: Open Heart Surgery;  Laterality: N/A;  . NOSE SURGERY    . RIGHT/LEFT HEART CATH AND CORONARY ANGIOGRAPHY N/A 05/05/2018   Procedure: RIGHT/LEFT HEART CATH AND CORONARY ANGIOGRAPHY;  Surgeon: Leonie Man, MD;  Location: [facility];  Service: Cardiovascular;  Laterality:  N/A;  . TEE WITHOUT CARDIOVERSION N/A 05/13/2018   Procedure: TRANSESOPHAGEAL ECHOCARDIOGRAM (TEE);  Surgeon: Melrose Nakayama, MD;  Location: [facility];  Service: Open Heart Surgery;  Laterality: N/A;  . ULTRASOUND GUIDANCE FOR VASCULAR ACCESS  05/05/2018   Procedure: Ultrasound Guidance For Vascular Access;   Surgeon: Leonie Man, MD;  Location: [facility];  Service: Cardiovascular;;     Family History: Family History  Problem Relation Age of Onset  . CVA Father        died of "cerebral hemorrhage"  . CAD Brother        died of "heart attack" at age 40    Social History: Social History   Socioeconomic History  . Marital status: Single    Spouse name: Not on file  . Number of children: Not on file  . Years of education: Not on file  . Highest education level: Not on file  Occupational History  . Not on file  Tobacco Use  . Smoking status: Former Research scientist (life sciences)  . Smokeless tobacco: Never Used  Vaping Use  . Vaping Use: Never used  Substance and Sexual Activity  . Alcohol use: Never  . Drug use: Never  . Sexual activity: Not on file  Other Topics Concern  . Not on file  Social History Narrative  . Not on file   Social Determinants of Health   Financial Resource Strain:   . Difficulty of Paying Living Expenses:   Food Insecurity:   . Worried About Charity fundraiser in the Last Year:   . Arboriculturist in the Last Year:   Transportation Needs:   . Film/video editor (Medical):   Marland Kitchen Lack of Transportation (Non-Medical):   Physical Activity:   . Days of Exercise per Week:   . Minutes of Exercise per Session:   Stress:   . Feeling of Stress :   Social Connections:   . Frequency of Communication with Friends and Family:   . Frequency of Social Gatherings with Friends and Family:   . Attends Religious Services:   . Active Member of Clubs or Organizations:   . Attends Archivist Meetings:   Marland Kitchen Marital Status:     Allergies: No Known Allergies  Outpatient Meds: Current Outpatient Medications  Medication Sig Dispense Refill  . aspirin EC 81 MG EC tablet Take 1 tablet (81 mg total) by mouth daily.    Marland Kitchen atorvastatin (LIPITOR) 80 MG tablet TAKE 1 TABLET BY MOUTH EVERY DAY AT 6 PM 30 tablet 1  . Blood Glucose Monitoring Suppl (ONE TOUCH ULTRA 2)  w/Device KIT     . carvedilol (COREG) 25 MG tablet Take 1 tablet (25 mg total) by mouth 2 (two) times daily. 180 tablet 3  . Lancets (ONETOUCH DELICA PLUS [id number]) MISC     . lisinopril (ZESTRIL) 5 MG tablet TAKE 1 TABLET BY MOUTH EVERY DAY 90 tablet 3  . ONETOUCH ULTRA test strip     . Simethicone (GAS-X EXTRA STRENGTH PO) Take by mouth. Gel caps uses as needed     No current facility-administered medications for this visit.      ___________________________________________________________________ Objective   Exam:  BP (!) 160/80 (BP Location: Left Arm, Patient Position: Sitting)   Pulse 62   Ht '5\' 9"'$  (1.753 m)   Wt 186 lb 9.6 oz (84.6 kg)   SpO2 96%   BMI 27.56 kg/m    General: He is well-appearing  Eyes: sclera anicteric,  no redness  ENT: oral mucosa moist without lesions, no cervical or supraclavicular lymphadenopathy  CV: RRR without murmur, S1/S2, no JVD, no peripheral edema  Resp: clear to auscultation bilaterally, normal RR and effort noted  GI: soft, no tenderness, with active bowel sounds. No guarding or palpable organomegaly noted.  Skin; warm and dry, no rash or jaundice noted  Neuro: awake, alert and oriented x 3. Normal gross motor function and fluent speech  Labs:  CBC Latest Ref Rng & Units 03/01/2019 02/28/2019 02/27/2019  WBC 4.0 - 10.5 K/uL 5.6 7.8 11.1(H)  Hemoglobin 13.0 - 17.0 g/dL 12.2(L) 13.0 14.9  Hematocrit 39 - 52 % 36.7(L) 38.3(L) 43.9  Platelets 150 - 400 K/uL 165 185 224   CMP Latest Ref Rng & Units 08/16/2019 03/01/2019 02/28/2019  Glucose 65 - 99 mg/dL 142(H) 145(H) 180(H)  BUN 8 - 27 mg/dL _0 Creatinine 0.76 - 1.27 mg/dL 1.03 1.00 1.09  Sodium 134 - 144 mmol/L 138 142 137  Potassium 3.5 - 5.2 mmol/L 4.5 3.9 4.2  Chloride 96 - 106 mmol/L 102 108 104  CO2 20 - 29 mmol/L _1 Calcium 8.6 - 10.2 mg/dL 9.1 8.6(L) 8.4(L)  Total Protein 6.5 - 8.1 g/dL - - 5.7(L)  Total Bilirubin 0.3 - 1.2 mg/dL - - 0.8  Alkaline Phos 38 - 126 U/L  - - 48  AST 15 - 41 U/L - - 22  ALT 0 - 44 U/L - - 20    Radiologic Studies: 08/21/2019 echocardiogram 1. Left ventricular ejection fraction, by estimation, is 25 to 30%. The  left ventricle has severely decreased function. The left ventricle  demonstrates global hypokinesis. There is mild left ventricular  hypertrophy. Left ventricular diastolic parameters   are consistent with Grade II diastolic dysfunction (pseudonormalization).  Elevated left atrial pressure. Patient refused Definity.   2. Right ventricular systolic function is mildly reduced. The right  ventricular size is mildly enlarged.   3. The mitral valve has been repaired/replaced. There is a 32 mm  prosthetic annuloplasty ring present in the mitral position. Trivial  regurgitation. Mean gradient 4 mmHg.   4. The aortic valve was not well visualized. Aortic valve regurgitation  is mild.   5. Compared to prior TTE on 01/31/19, no significant change in LV systolic  function    Assessment: Encounter Diagnoses  Name Primary?  . Abdominal bloating Yes  . Flatulence   . Ischemic cardiomyopathy   . S/P CABG x 5     Abdominal bloating and gas, most likely dietary related and also from constipation that is now improved.  He has never had proper colon cancer screening. We discussed the screening options of FOBT, Cologuard and colonoscopy.  I do not recommend FOBT due to its lack of sensitivity and specificity.  Cologuard is certainly better than that in its ability to pick up DNA fragments of some precancerous polyps.  It still has about a 15% false negative rate.  It has the benefit of being noninvasive.  If positive, and requires a diagnostic colonoscopy (which is also covered differently by Medicare, something which I made them aware of).   Colonoscopy is the most sensitive and specific test for colorectal cancer screening and removal of polyps if necessary, but of course is invasive and requires a preparation, sedation and  those logistics.    Increased risk for colonoscopy due to severity of LV dysfunction, so advised to have a Cologuard.  Plan:  Cologuard ordered. Written dietary  advice given for bloating and gas as well.  Thank you for the courtesy of this consult.  Please call me with any questions or concerns.  Nelida Meuse III  CC: Referring provider noted above

## 2019-12-15 NOTE — Patient Instructions (Addendum)
_______________________________________________________ Food Guidelines for those with chronic digestive trouble:  Many people have difficulty digesting certain foods, causing a variety of distressing and embarrassing symptoms such as abdominal pain, bloating and gas.  These foods may need to be avoided or consumed in small amounts.  Here are some tips that might be helpful for you.  1.   Lactose intolerance is the difficulty or complete inability to digest lactose, the natural sugar in milk and anything made from milk.  This condition is harmless, common, and can begin any time during life.  Some people can digest a modest amount of lactose while others cannot tolerate any.  Also, not all dairy products contain equal amounts of lactose.  For example, hard cheeses such as parmesan have less lactose than soft cheeses such as cheddar.  Yogurt has less lactose than milk or cheese.  Many packaged foods (even many brands of bread) have milk, so read ingredient lists carefully.  It is difficult to test for lactose intolerance, so just try avoiding lactose as much as possible for a week and see what happens with your symptoms.  If you seem to be lactose intolerant, the best plan is to avoid it (but make sure you get calcium from another source).  The next best thing is to use lactase enzyme supplements, available over the counter everywhere.  Just know that many lactose intolerant people need to take several tablets with each serving of dairy to avoid symptoms.  Lastly, a lot of restaurant food is made with milk or butter.  Many are things you might not suspect, such as mashed potatoes, rice and pasta (cooked with butter) and "grilled" items.  If you are lactose intolerant, it never hurts to ask your server what has milk or butter.  2.   Fiber is an important part of your diet, but not all fiber is well-tolerated.  Insoluble fiber such as bran is often consumed by normal gut bacteria and converted into gas.  Soluble  fiber such as oats, squash, carrots and green beans are typically tolerated better.  3.   Some types of carbohydrates can be poorly digested.  Examples include: fructose (apples, cherries, pears, raisins and other dried fruits), fructans (onions, zucchini, large amounts of wheat), sorbitol/mannitol/xylitol and sucralose/Splenda (common artificial sweeteners), and raffinose (lentils, broccoli, cabbage, asparagus, brussel sprouts, many types of beans).  Do a web search for FODMAP diet and you will find helpful information. Beano, a dietary supplement, will often help with raffinose-containing foods.  As with lactase tablets, you may need several per serving.  4.   Whenever possible, avoid processed food&meats and chemical additives.  High fructose corn syrup, a common sweetener, may be difficult to digest.  Eggs and soy (comes from the soybean, and added to many foods now) are other common bloating/gassy foods.  5.  Regarding gluten:  gluten is a protein mainly found in wheat, but also rye and barley.  There is a condition called celiac sprue, which is an inflammatory reaction in the small intestine causing a variety of digestive symptoms.  Blood testing is highly reliable to look for this condition, and sometimes upper endoscopy with small bowel biopsies may be necessary to make the diagnosis.  Many patients who test negative for celiac sprue report improvement in their digestive symptoms when they switch to a gluten-free diet.  However, in these "non-celiac gluten sensitive" patients, the true role of gluten in their symptoms is unclear.  Reducing carbohydrates in general may decrease the gas and   caused when gut bacteria consume carbs. Also, some of these patients may actually be intolerant of the baker's yeast in bread products rather than the gluten.  Flatbread and other reduced yeast breads might therefore be tolerated.  There is no specific testing available for most food intolerances, which are discovered mainly by dietary elimination.  Please do  not embark on a gluten free diet unless directed by your doctor, as it is highly restrictive, and may lead to nutritional deficiencies if not carefully monitored.  Lastly, beware of internet claims offering "personalized" tests for food intolerances.  Such testing has no reliable scientific evidence to support its reliability and correlation to symptoms.    6.  The best advice is old advice, especially for those with chronic digestive trouble - try to eat "clean".  Balanced diet, avoid processed food, plenty of fruits and vegetables, cut down the sugar, minimal alcohol, avoid tobacco. Make time to care for yourself, get enough sleep, exercise when you can, reduce stress.  Your guts will thank you for it.   - Dr. [facility] _______________________________________   Your provider has ordered Cologuard testing as an option for colon cancer screening. This is performed by [facility] [facility] [employer] and may be out of network with your insurance. PRIOR to completing the test, it is YOUR responsibility to contact your insurance about covered benefits for this test. Your out of pocket expense could be anywhere from $0.00 to $649.00.   When you call to check coverage with your insurer, please provide the following information:   -The ONLY provider of Cologuard is Opt[facility], dispensing  - CPT code for Cologuard is [phone].  [facility] NPI # [id number]  -[employer] Tax ID # [id number]   We have already sent your demographic and insurance information to [facility] [facility] [employer] (phone number [phone]) and they should contact you within the next week regarding your test. If you have not heard from them within the next week, please call our office at [phone].

## 2020-01-01 DIAGNOSIS — Z1212 Encounter for screening for malignant neoplasm of rectum: Secondary | ICD-10-CM | POA: Diagnosis not present

## 2020-01-01 DIAGNOSIS — Z1211 Encounter for screening for malignant neoplasm of colon: Secondary | ICD-10-CM | POA: Diagnosis not present

## 2020-01-02 DIAGNOSIS — H52209 Unspecified astigmatism, unspecified eye: Secondary | ICD-10-CM | POA: Diagnosis not present

## 2020-01-02 DIAGNOSIS — H524 Presbyopia: Secondary | ICD-10-CM | POA: Diagnosis not present

## 2020-01-02 DIAGNOSIS — Z0100 Encounter for examination of eyes and vision without abnormal findings: Secondary | ICD-10-CM | POA: Diagnosis not present

## 2020-01-02 DIAGNOSIS — H5203 Hypermetropia, bilateral: Secondary | ICD-10-CM | POA: Diagnosis not present

## 2020-01-03 DIAGNOSIS — H5203 Hypermetropia, bilateral: Secondary | ICD-10-CM | POA: Diagnosis not present

## 2020-01-08 ENCOUNTER — Other Ambulatory Visit: Payer: Self-pay

## 2020-01-08 LAB — COLOGUARD: Cologuard: NEGATIVE

## 2020-02-14 ENCOUNTER — Other Ambulatory Visit: Payer: Self-pay | Admitting: Physician Assistant

## 2020-02-14 ENCOUNTER — Telehealth: Payer: Self-pay | Admitting: Cardiology

## 2020-02-14 MED ORDER — ATORVASTATIN CALCIUM 80 MG PO TABS: ORAL_TABLET | ORAL | 2 refills | Status: DC

## 2020-02-14 NOTE — Telephone Encounter (Signed)
Pt's medication was sent to pt's pharmacy as requested. Confirmation received.  °

## 2020-02-14 NOTE — Telephone Encounter (Signed)
   *  STAT* If patient is at the pharmacy, call can be transferred to refill team.   1. Which medications need to be refilled? (please list name of each medication and dose if known)   atorvastatin (LIPITOR) 80 MG tablet    2. Which pharmacy/location (including street and city if local pharmacy) is medication to be sent to? [id number] - [place], [place] - [address]   3. Do they need a 30 day or 90 day supply? 90 days

## 2020-02-28 ENCOUNTER — Telehealth: Payer: Self-pay | Admitting: Cardiology

## 2020-02-28 NOTE — Telephone Encounter (Signed)
Patient says that he needs a blood test and wonder if Dr. Delton See or Jacolyn Reedy could provide that for him. Also stated that he had kidney stones last year and it fills like there are back.

## 2020-02-28 NOTE — Telephone Encounter (Signed)
Pt was calling in to ask if Dr. Delton See would order a blood test to assess for possible UTI, or kidney stones.  Pt states back in June he had kidney stones, and his symptoms feel like this. Pt states he has a Insurance underwriter, but thinks if he tries to call them, they will push his appt out.  Pt states he has no PCP at this time.  Advised the pt that he should call his Urologist office now, and inquire on getting a same day appt with an Extender in their office, so that the appropriate testing can be done to assess for UTI/kidney stones.  Informed the pt that he will more than likely have to provide them with a urinalysis, so that they can further assess his complaints.   Advised him to call them now, and hydrate well with water. Also advised him that it would be in his best interest to find himself a new PCP as well.  Pt verbalized understanding and agrees with this plan. Pt states he will call his Urologist office now and make an appt.  Pt was gracious for all the assistance provided.

## 2020-03-04 DIAGNOSIS — R351 Nocturia: Secondary | ICD-10-CM | POA: Diagnosis not present

## 2020-03-04 DIAGNOSIS — R300 Dysuria: Secondary | ICD-10-CM | POA: Diagnosis not present

## 2020-03-04 DIAGNOSIS — N401 Enlarged prostate with lower urinary tract symptoms: Secondary | ICD-10-CM | POA: Diagnosis not present

## 2020-03-04 DIAGNOSIS — Z87442 Personal history of urinary calculi: Secondary | ICD-10-CM | POA: Diagnosis not present

## 2020-03-08 ENCOUNTER — Other Ambulatory Visit: Payer: Self-pay | Admitting: Cardiology

## 2020-03-28 DIAGNOSIS — R69 Illness, unspecified: Secondary | ICD-10-CM | POA: Diagnosis not present

## 2020-05-13 ENCOUNTER — Other Ambulatory Visit: Payer: Self-pay | Admitting: *Deleted

## 2020-05-13 DIAGNOSIS — I714 Abdominal aortic aneurysm, without rupture, unspecified: Secondary | ICD-10-CM

## 2020-05-15 IMAGING — CR DG CHEST 2V
2 series · 2 of 2 positions shown · non-contrast
Comparison: None.

CLINICAL DATA: Cough and shortness of breath

EXAM:
CHEST - 2 VIEW

[chest pa]
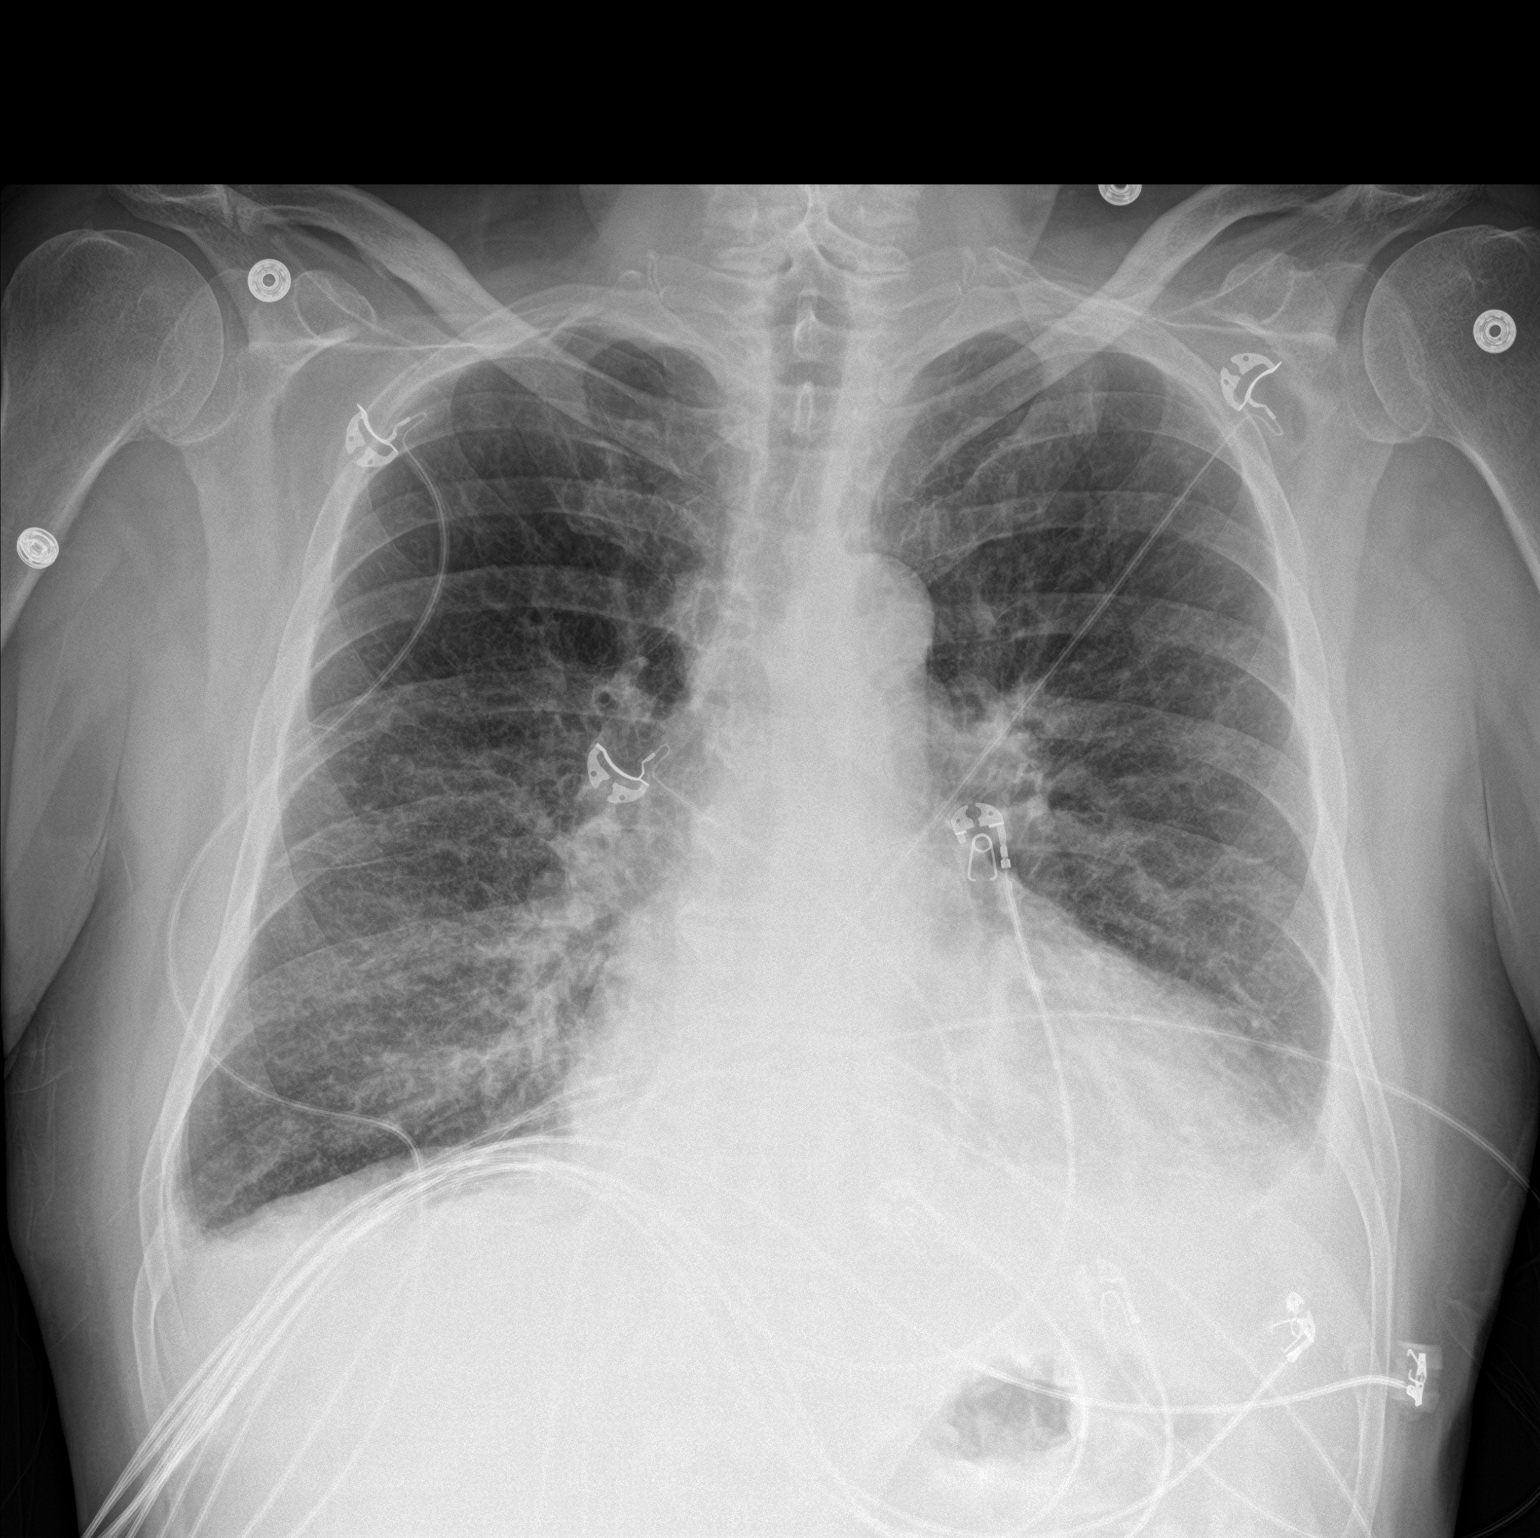

[chest lat]
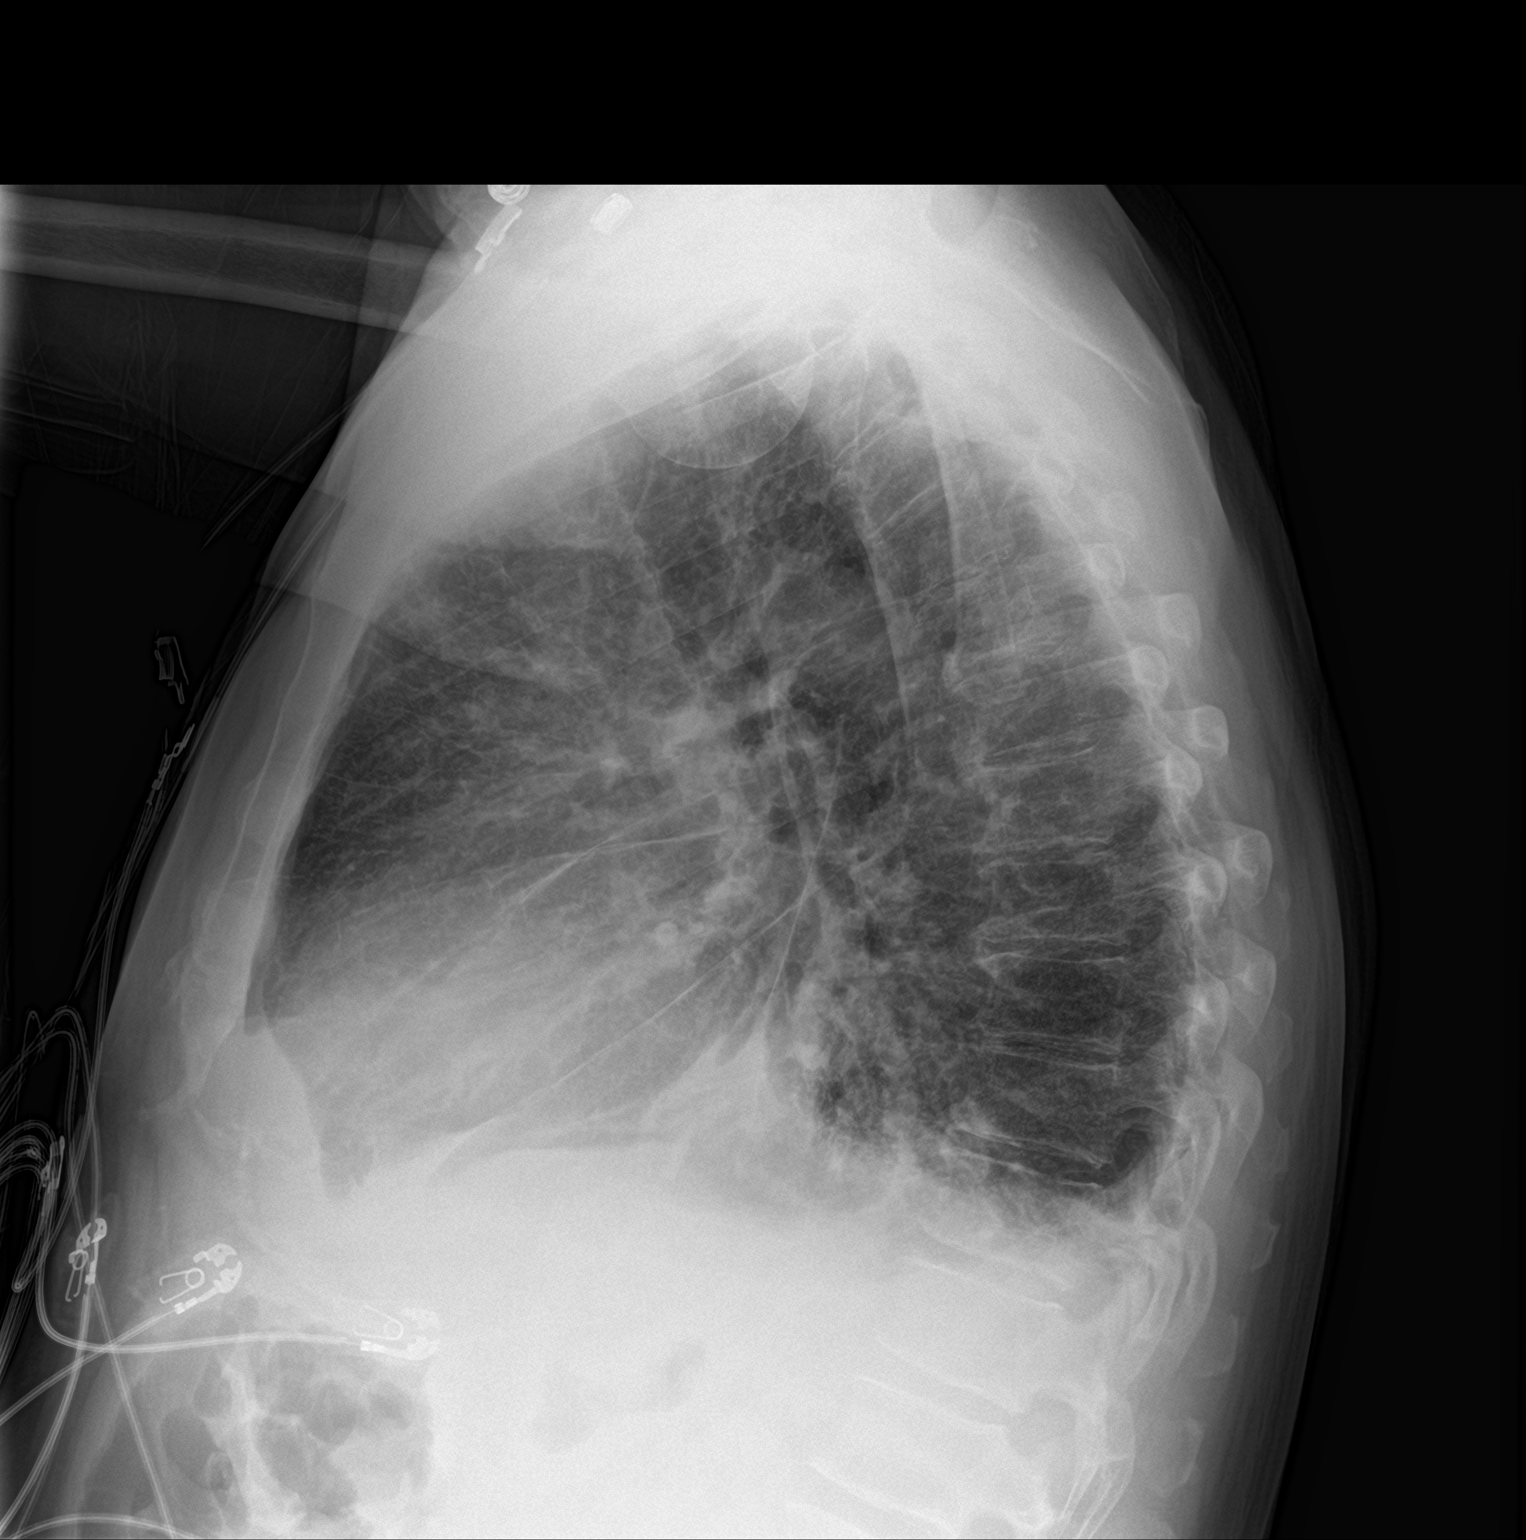

[2 of 2 positions shown; findings below may reference images not displayed]

FINDINGS: There are small pleural effusions bilaterally. There is atelectatic
change in the left base. There is cardiomegaly with mild pulmonary
venous hypertension. There is underlying interstitial edema. No
frank consolidation.

No adenopathy evident. There is degenerative change in the thoracic
spine.
IMPRESSION: Pulmonary vascular congestion. Small pleural effusions bilaterally
with interstitial edema. There may be a degree of underlying
congestive heart failure. No frank consolidation, although there is
atelectatic change in the left base.

## 2020-05-21 ENCOUNTER — Encounter: Payer: Self-pay | Admitting: Vascular Surgery

## 2020-05-21 ENCOUNTER — Ambulatory Visit (INDEPENDENT_AMBULATORY_CARE_PROVIDER_SITE_OTHER): Payer: Medicare HMO | Admitting: Vascular Surgery

## 2020-05-21 ENCOUNTER — Other Ambulatory Visit: Payer: Self-pay

## 2020-05-21 ENCOUNTER — Ambulatory Visit (HOSPITAL_COMMUNITY)
Admission: RE | Admit: 2020-05-21 | Discharge: 2020-05-21 | Disposition: A | Discharge status: Home / Self Care | Payer: Medicare HMO | Source: Ambulatory Visit | Attending: Vascular Surgery | Admitting: Vascular Surgery

## 2020-05-21 VITALS — BP 129/76 | HR 58 | Temp 97.9°F | Resp 16 | Ht 69.0 in | Wt 184.0 lb

## 2020-05-21 DIAGNOSIS — I714 Abdominal aortic aneurysm, without rupture, unspecified: Secondary | ICD-10-CM

## 2020-05-21 NOTE — Progress Notes (Signed)
Patient name: Michael Banks MRN: [id number] DOB: 1951/04/13 Sex: male  REASON FOR CONSULT: 83 month follow-up for AAA HPI: Michael Banks is a 69 y.o. male, with history of coronary artery disease status post CABG and mitral valve repair, hypertension, hyperlipidemia, tobacco abuse, CHF with a EF of 25 to 30% presents for 6 month follow-up of known abdominal aortic aneurysm.  This was initially discovered last year by his urologist.  He had a stone study 02/27/2019 with noted 4.6 cm infrarenal abdominal aortic aneurysm.  States he is now off coumadin but no other changes to his health.  Is having ongoing heartburn and plans to see GI for another opinion soon.  States he gets rumbling in stomach and then burning in upper chest.  He has had no abdominal surgery as previously noted.  He had a repeat CTA by his cardiologist on 08/21/2019 that shows no change with stable 4.6 cm infrarenal abdominal aortic aneurysm.    Past Medical History:  Diagnosis Date   Arthritis    Diabetes ([place])    Dilated cardiomyopathy ([place])    Gallstones    Hydronephrosis    Hyperglycemia    Hypertension    Kidney stone    UTI (urinary tract infection)     Past Surgical History:  Procedure Laterality Date   CORONARY ARTERY BYPASS GRAFT N/A 05/13/2018   Procedure: CORONARY ARTERY BYPASS GRAFTING (CABG) times _five__ using left internal mammary artery and right  greater saphenous vein harvested endoscopically.;  Surgeon: Melrose Nakayama, MD;  Location: [facility];  Service: Open Heart Surgery;  Laterality: N/A;   KNEE ARTHROSCOPY     MITRAL VALVE REPAIR N/A 05/13/2018   Procedure: Mitral Valve Repair;  Surgeon: Melrose Nakayama, MD;  Location: [facility];  Service: Open Heart Surgery;  Laterality: N/A;   NOSE SURGERY     RIGHT/LEFT HEART CATH AND CORONARY ANGIOGRAPHY N/A 05/05/2018   Procedure: RIGHT/LEFT HEART CATH AND CORONARY ANGIOGRAPHY;  Surgeon: Leonie Man, MD;  Location: [facility];  Service: Cardiovascular;  Laterality: N/A;   TEE WITHOUT CARDIOVERSION N/A 05/13/2018   Procedure: TRANSESOPHAGEAL ECHOCARDIOGRAM (TEE);  Surgeon: Melrose Nakayama, MD;  Location: [facility];  Service: Open Heart Surgery;  Laterality: N/A;   ULTRASOUND GUIDANCE FOR VASCULAR ACCESS  05/05/2018   Procedure: Ultrasound Guidance For Vascular Access;  Surgeon: Leonie Man, MD;  Location: [facility];  Service: Cardiovascular;;    Family History  Problem Relation Age of Onset   CVA Father        died of "cerebral hemorrhage"   CAD Brother        died of "heart attack" at age 65    SOCIAL HISTORY: Social History   Socioeconomic History   Marital status: Single    Spouse name: Not on file   Number of children: Not on file   Years of education: Not on file   Highest education level: Not on file  Occupational History   Not on file  Tobacco Use   Smoking status: Former Smoker   Smokeless tobacco: Never Used  Scientific laboratory technician Use: Never used  Substance and Sexual Activity   Alcohol use: Never   Drug use: Never   Sexual activity: Not on file  Other Topics Concern   Not on file  Social History Narrative   Not on file   Social Determinants of Health   Financial Resource Strain:    Difficulty of  Paying Living Expenses: Not on file  Food Insecurity:    Worried About [place] in the Last Year: Not on file   Ran Out of Food in the Last Year: Not on file  Transportation Needs:    Lack of Transportation (Medical): Not on file   Lack of Transportation (Non-Medical): Not on file  Physical Activity:    Days of Exercise per Week: Not on file   Minutes of Exercise per Session: Not on file  Stress:    Feeling of Stress : Not on file  Social Connections:    Frequency of Communication with Friends and Family: Not on file   Frequency of Social Gatherings with Friends and Family: Not on file   Attends Religious Services: Not on  file   Active Member of Clubs or Organizations: Not on file   Attends Archivist Meetings: Not on file   Marital Status: Not on file  Intimate Partner Violence:    Fear of Current or Ex-Partner: Not on file   Emotionally Abused: Not on file   Physically Abused: Not on file   Sexually Abused: Not on file    No Known Allergies  Current Outpatient Medications  Medication Sig Dispense Refill   aspirin EC 81 MG EC tablet Take 1 tablet (81 mg total) by mouth daily.     atorvastatin (LIPITOR) 80 MG tablet TAKE 1 TABLET BY MOUTH EVERY DAY AT 6 PM 90 tablet 2   Blood Glucose Monitoring Suppl (ONE TOUCH ULTRA 2) w/Device KIT      carvedilol (COREG) 25 MG tablet TAKE 1 TABLET BY MOUTH TWICE A DAY 180 tablet 1   Lancets (ONETOUCH DELICA PLUS [id number]) MISC      lisinopril (ZESTRIL) 5 MG tablet TAKE 1 TABLET BY MOUTH EVERY DAY 90 tablet 3   ONETOUCH ULTRA test strip      Simethicone (GAS-X EXTRA STRENGTH PO) Take by mouth. Gel caps uses as needed     No current facility-administered medications for this visit.    REVIEW OF SYSTEMS:  '[X]'  denotes positive finding, '[ ]'  denotes negative finding Cardiac  Comments:  Chest pain or chest pressure:    Shortness of breath upon exertion:    Short of breath when lying flat:    Irregular heart rhythm:        Vascular    Pain in calf, thigh, or hip brought on by ambulation:    Pain in feet at night that wakes you up from your sleep:     Blood clot in your veins:    Leg swelling:         Pulmonary    Oxygen at home:    Productive cough:     Wheezing:         Neurologic    Sudden weakness in arms or legs:     Sudden numbness in arms or legs:     Sudden onset of difficulty speaking or slurred speech:    Temporary loss of vision in one eye:     Problems with dizziness:         Gastrointestinal    Blood in stool:     Vomited blood:         Genitourinary    Burning when urinating:     Blood in urine:         Psychiatric    Major depression:         Hematologic    Bleeding problems:  Problems with blood clotting too easily:        Skin    Rashes or ulcers:        Constitutional    Fever or chills:      PHYSICAL EXAM: Vitals:   05/21/20 0909  BP: 129/76  Pulse: (!) 58  Resp: 16  Temp: 97.9 F (36.6 C)  TempSrc: Temporal  SpO2: 99%  Weight: 184 lb (83.5 kg)  Height: '5\' 9"'  (1.753 m)    GENERAL: The patient is a well-nourished male, in no acute distress. The vital signs are documented above. CARDIAC: There is a regular rate and rhythm.  VASCULAR:  Palpable femoral pulses bilaterally Palpable dorsalis pedis pulses bilaterally  PULMONARY: No respiratory distress. ABDOMEN: Soft and non-tender.  No pain with palpation of aneurysm. MUSCULOSKELETAL: There are no major deformities or cyanosis. NEUROLOGIC: No focal weakness or paresthesias are detected. SKIN: There are no ulcers or rashes noted. PSYCHIATRIC: The patient has a normal affect.  DATA:   Previously reviewed CTA abdomen pelvis from 08/21/19 shows here is a 4.6 cm infrarenal abdominal aortic   Repeat AAA duplex today shows some growth in infra-renal AAA now measuring 4.89 cm  Assessment/Plan:  69 year old male presents for 6 month follow-up of known 4.6 cm infrarenal abdominal aortic aneurysm.  I reviewed the AAA duplex today and aneurysm now measures 4.89 cm.  Discussed that there has been some minimal growth in the aneurysm over the last 6 months.  We discussed that current guidelines are to repair these at greater than 5.5 cm unless we feel his aneurysm is symptomatic or growing rapidly.  Again he has no pain with palpation of his aneurysm today and no symptoms that are concerning for aneurysm pain.  I will plan to see him back in 6 months with another AAA duplex here in the office.   Marty Heck, MD [facility] Office: [phone]

## 2020-05-24 IMAGING — DX DG CHEST 2V
2 series · 2 of 2 positions shown · non-contrast
Comparison: Radiograph 05/10/2018

CLINICAL DATA: Preop CABG.

EXAM:
CHEST - 2 VIEW

[w chest pa]
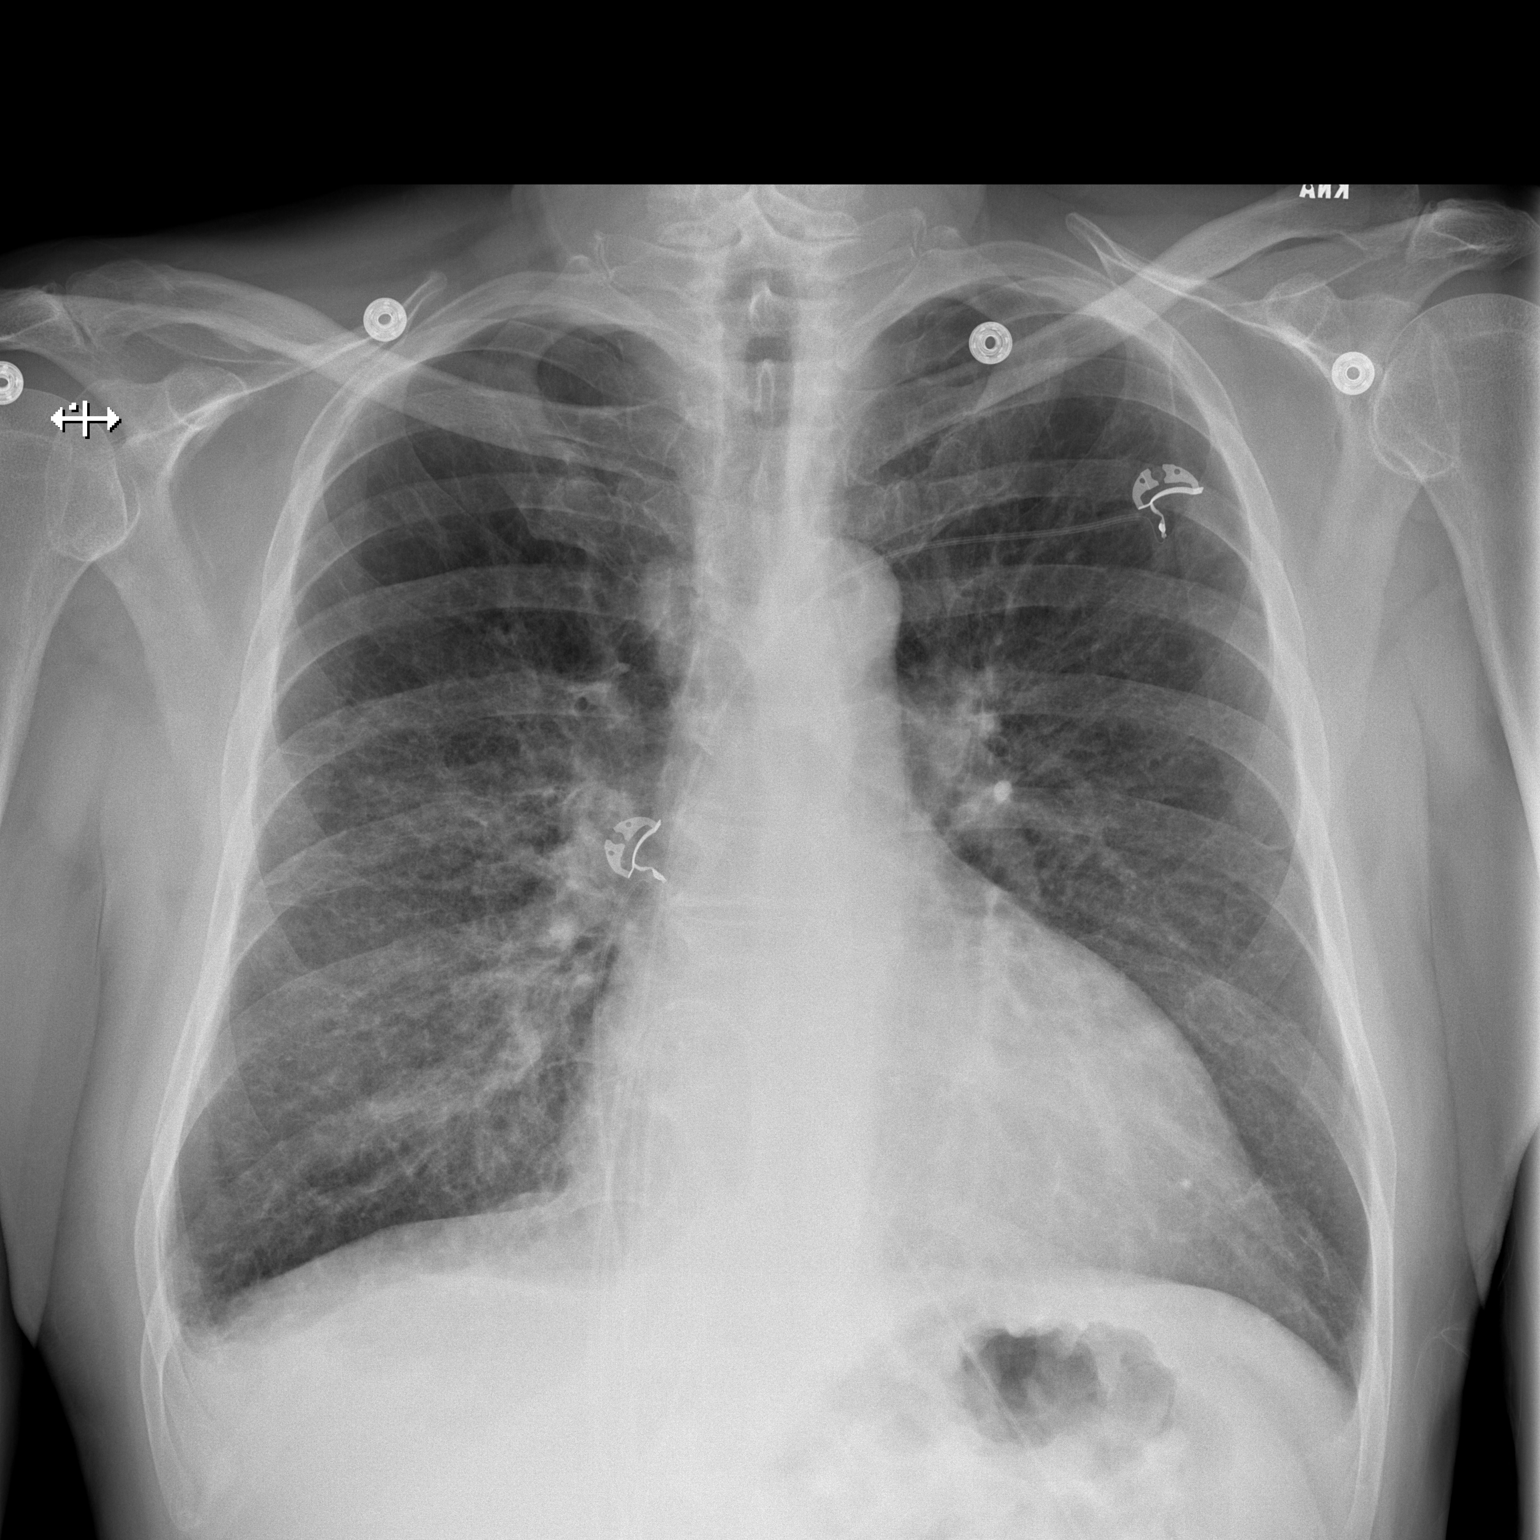

[w chest lat]
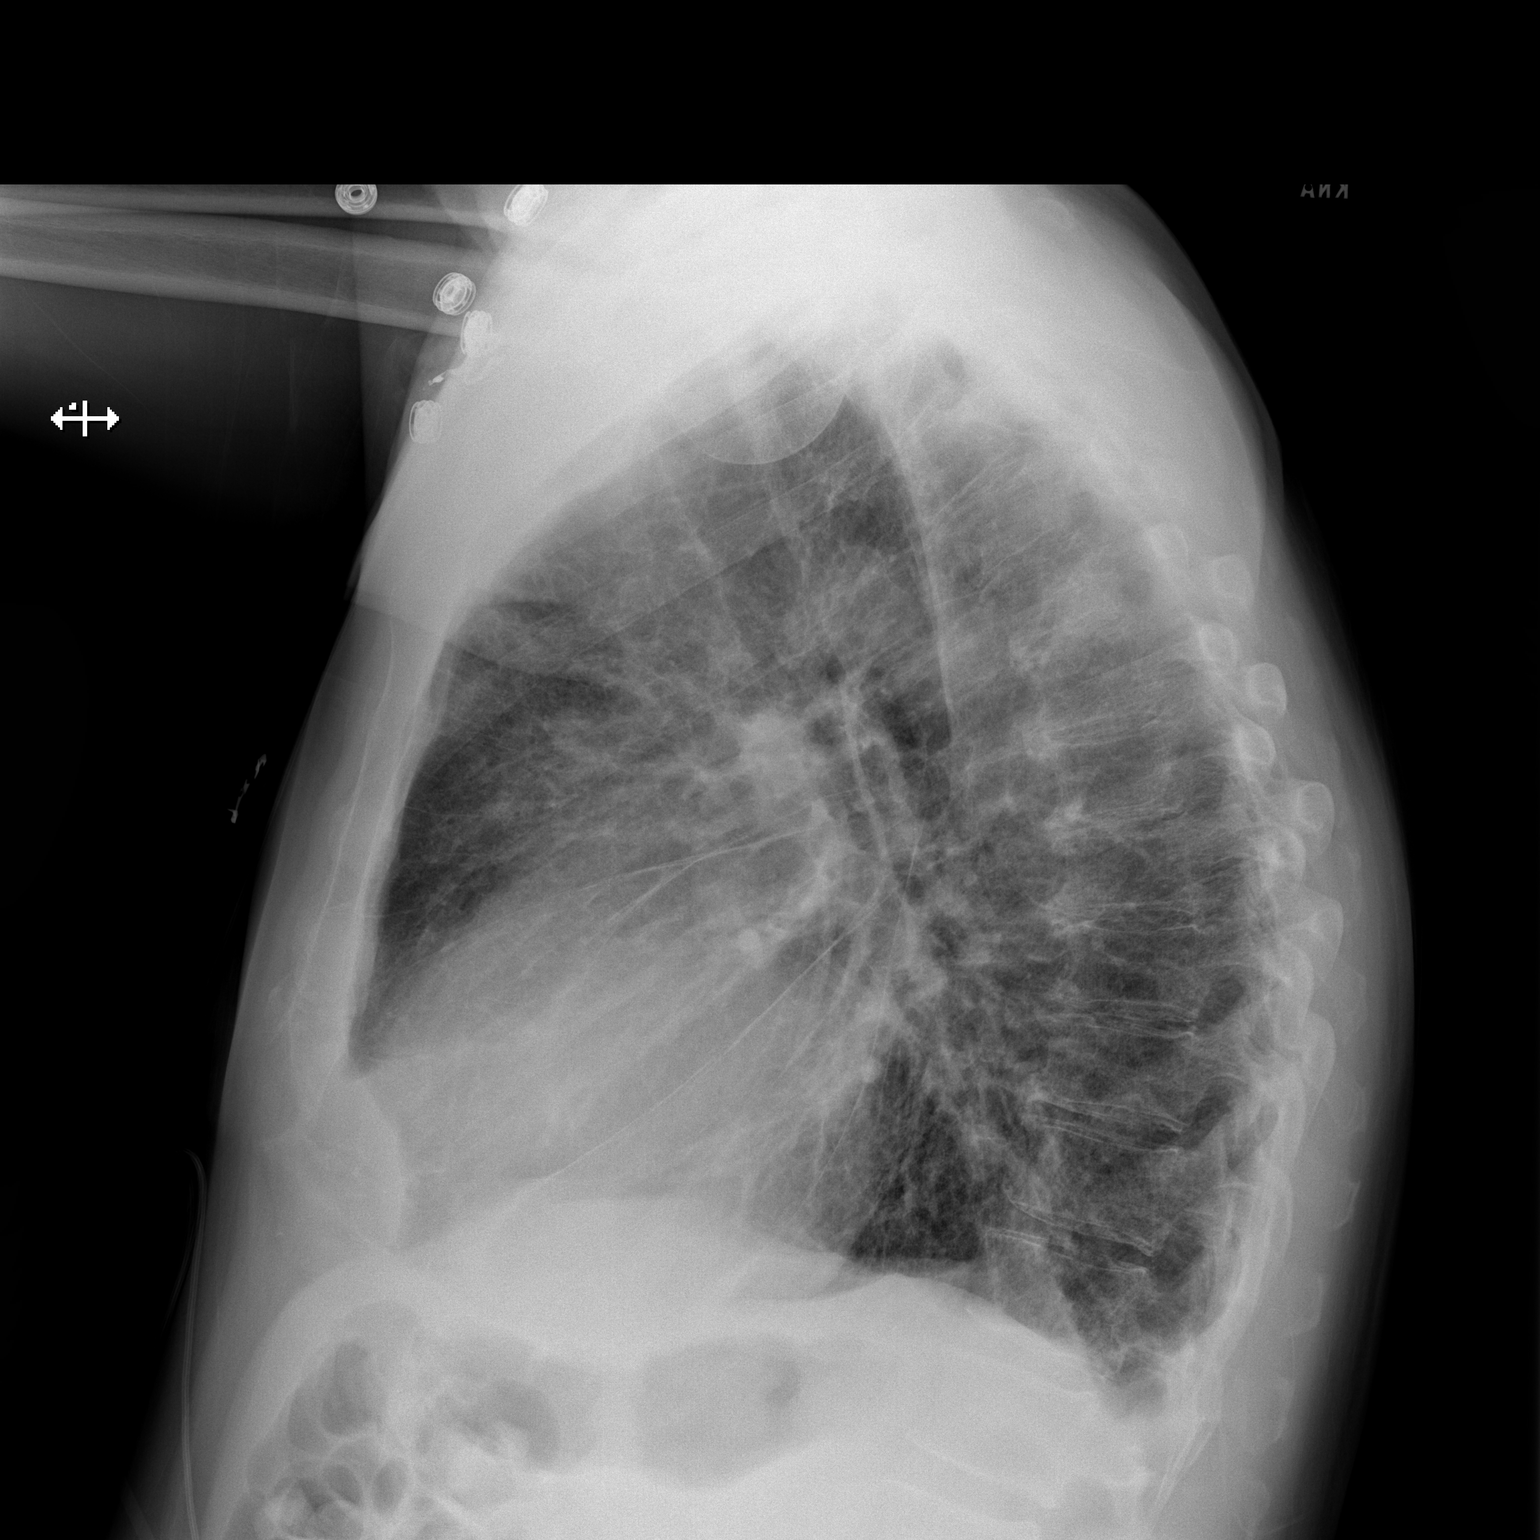

[2 of 2 positions shown; findings below may reference images not displayed]

FINDINGS: 18 mildly enlarged cardiac silhouette. Mild central venous
congestion interstitial edema pattern. Findings similar comparison
exam. Lungs mildly hyperinflated. No pneumothorax.
IMPRESSION: 1. Mild interstitial edema pattern similar  to prior.
2. Hyperinflated lungs.

## 2020-07-24 IMAGING — DX DG CHEST 2V
2 series · 2 of 2 positions shown · non-contrast
Comparison: 06/20/2018

CLINICAL DATA: Chest pain, CABG x5

EXAM:
CHEST - 2 VIEW

[dg chest 2 view (1 of 2)]
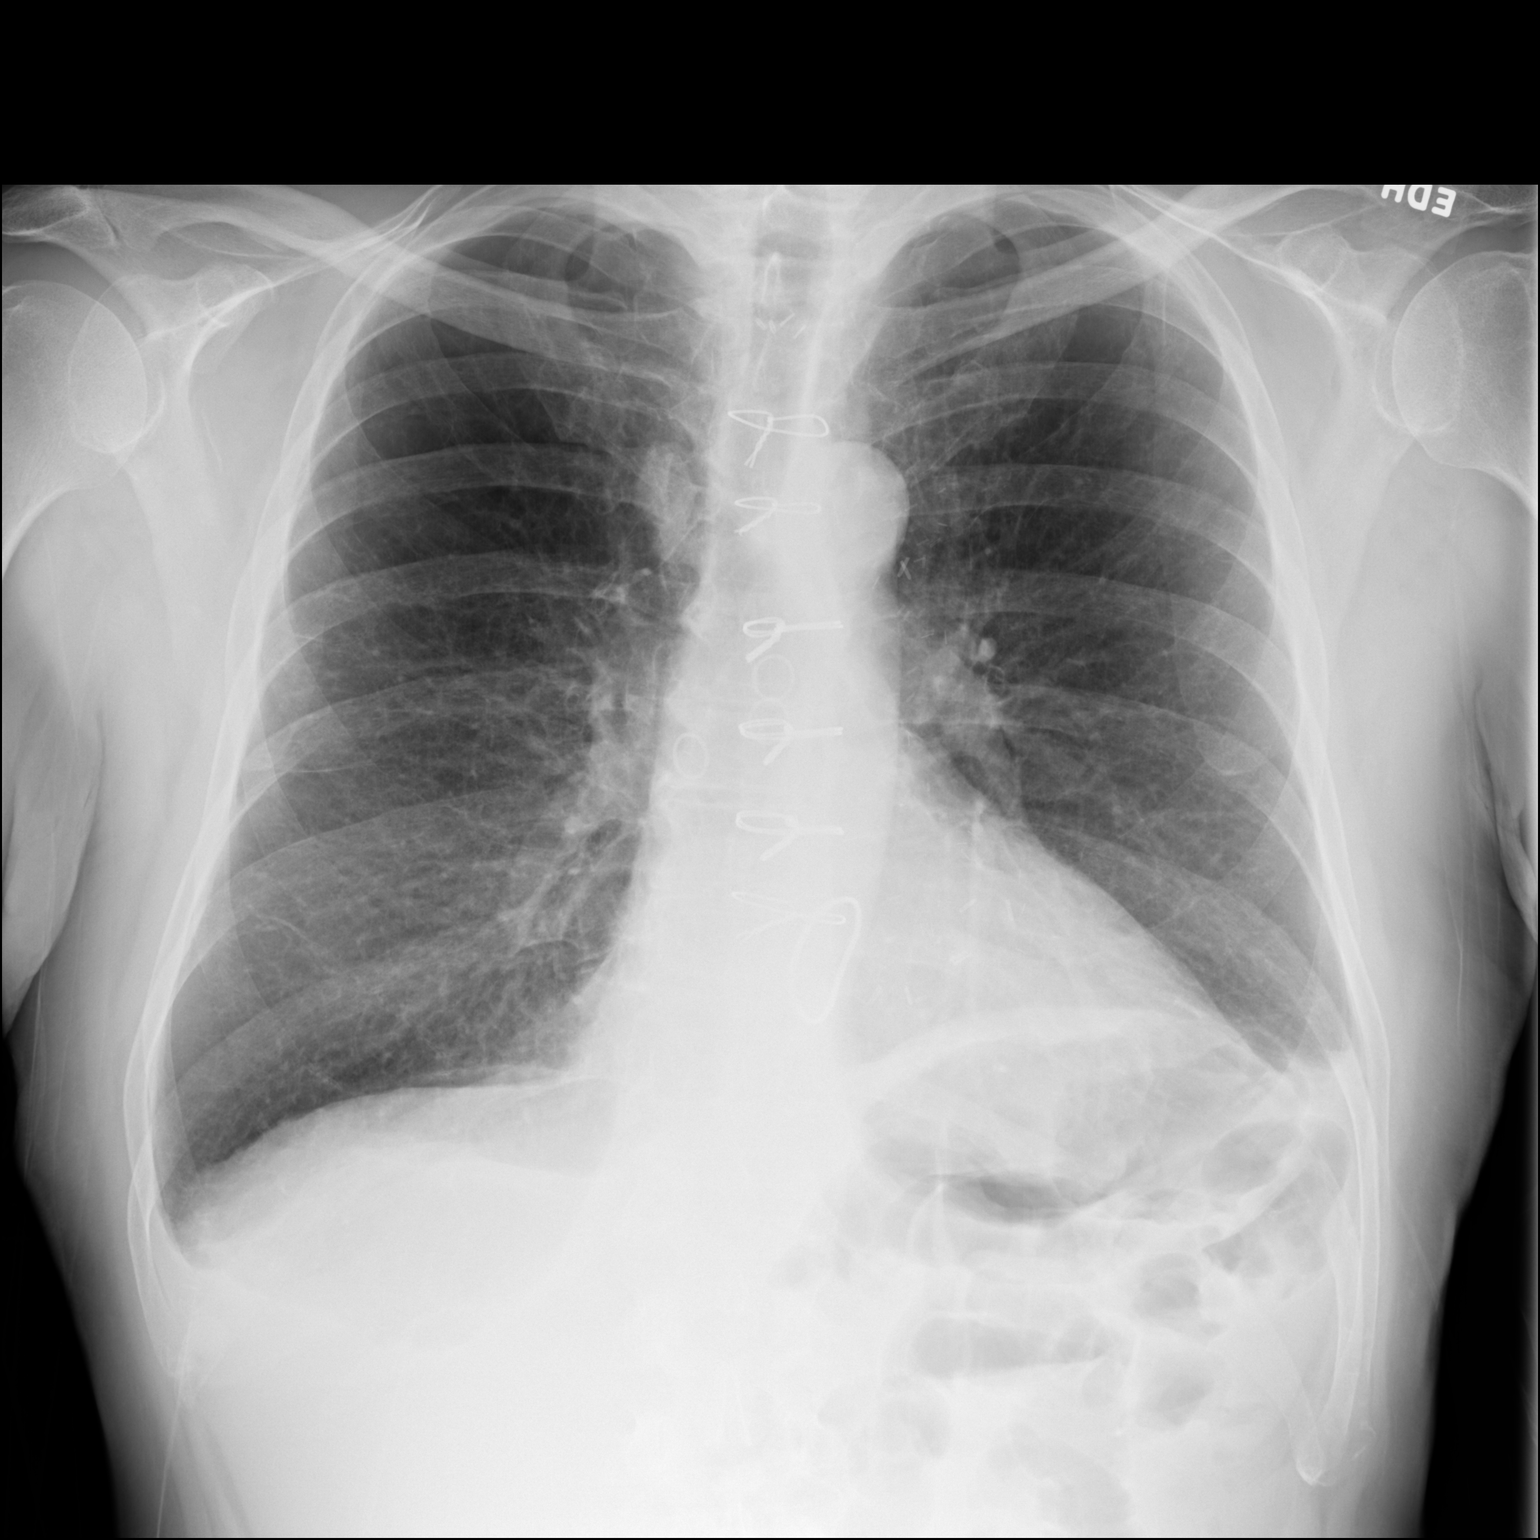

[dg chest 2 view (2 of 2)]
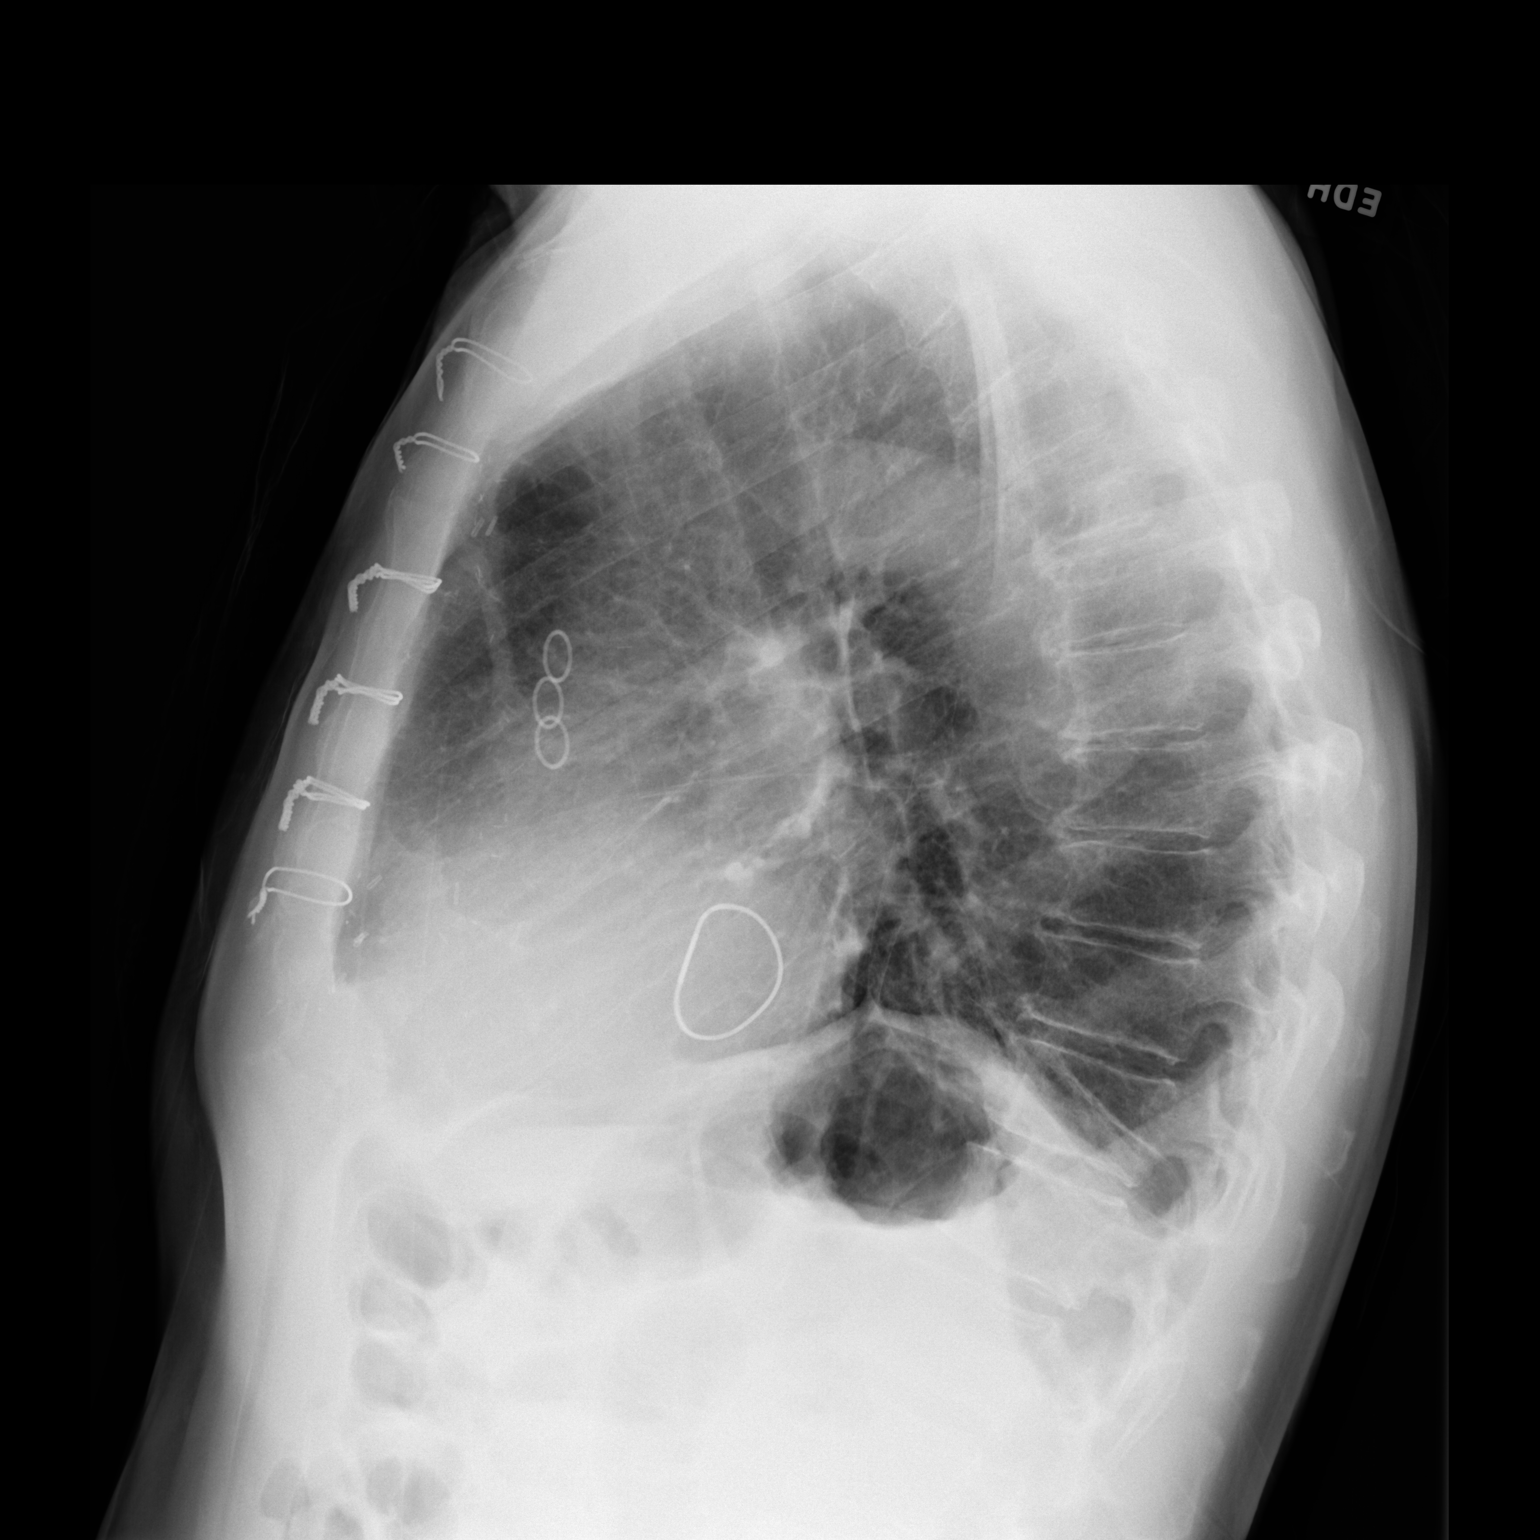

[2 of 2 positions shown; findings below may reference images not displayed]

FINDINGS: Lungs are clear. Trace bilateral pleural effusions. No pneumothorax.

The heart is normal in size.  Prosthetic valve.  Insert cabbage

Median sternotomy.  Mild degenerative changes of the thoracic spine.
IMPRESSION: Trace bilateral pleural effusions.

## 2020-08-26 ENCOUNTER — Other Ambulatory Visit: Payer: Self-pay | Admitting: Cardiology

## 2020-09-18 ENCOUNTER — Other Ambulatory Visit: Payer: Self-pay | Admitting: Physician Assistant

## 2020-09-18 NOTE — Progress Notes (Signed)
Cardiology Office Note:    Date:  09/19/2020   ID:  Michael Banks, DOB May 25, 1951, MRN [id number]  PCP:  Freada Bergeron, [place]  Cardiologist:  Ena Dawley, MD Advanced Practice Provider:  No care team member to display Electrophysiologist:  None   Referring MD: Dorothy Spark, MD    History of Present Illness:    Michael Banks is a 70 y.o. male with a hx of CAD s/p 5v CABG in 2019 with MVR, hypertension, HLD, tobacco abuse, and DMII who was previously followed by Dr. Meda Coffee who now returns to clinic for follow-up.  The patient was admitted 04/2018 with shortness of breath and orthopnea. Echo showed severely reduced LV function ejection fraction 20 to 25% with moderate MR. Cardiac catheterization revealed severe CAD and severe MR. Mild cardio viability study revealed viability in 12 out of 17 segments. He underwent CABG x5 and MV repair 05/13/2018. Briefly had atrial fibrillation postop converted to normal sinus rhythm with amiodarone.He is to be maintained on Coumadin and aspirin perCVTS. No ace inhibitor or ARB or Spironolactone given because of elevated creatinine in the hospital.  Last saw Dr. Meda Coffee on 10/06/19 where he was doing very well. Was not able to start entresto due to cost. TTE 08/21/19 with LVEF 25-30%, G2DD, trivial MR with mean gradient 92mmHg. Noted to have AAA which is now monitored by Dr. Carlis Abbott. Currently measures 4.89cm; plan to intervene if greater than 5.5.  Patient states that he is doing well. Has been have indigestion/acid reflux with burning sensation after laying down after eating that has been ongoing for the past couple of months. Has been taking Gas-X which has helped some but he continues to have it especially after big meals. No exertional symptoms. No shortness of breath, LE edema, or orthopnea. Tolerating all medications.  Past Medical History:  Diagnosis Date  . Arthritis   . Diabetes ([place])    . Dilated cardiomyopathy ([place])   . Gallstones   . Hydronephrosis   . Hyperglycemia   . Hypertension   . Kidney stone   . UTI (urinary tract infection)     Past Surgical History:  Procedure Laterality Date  . CORONARY ARTERY BYPASS GRAFT N/A 05/13/2018   Procedure: CORONARY ARTERY BYPASS GRAFTING (CABG) times _five__ using left internal mammary artery and right  greater saphenous vein harvested endoscopically.;  Surgeon: Melrose Nakayama, MD;  Location: [facility];  Service: Open Heart Surgery;  Laterality: N/A;  . KNEE ARTHROSCOPY    . MITRAL VALVE REPAIR N/A 05/13/2018   Procedure: Mitral Valve Repair;  Surgeon: Melrose Nakayama, MD;  Location: [facility];  Service: Open Heart Surgery;  Laterality: N/A;  . NOSE SURGERY    . RIGHT/LEFT HEART CATH AND CORONARY ANGIOGRAPHY N/A 05/05/2018   Procedure: RIGHT/LEFT HEART CATH AND CORONARY ANGIOGRAPHY;  Surgeon: Leonie Man, MD;  Location: [facility];  Service: Cardiovascular;  Laterality: N/A;  . TEE WITHOUT CARDIOVERSION N/A 05/13/2018   Procedure: TRANSESOPHAGEAL ECHOCARDIOGRAM (TEE);  Surgeon: Melrose Nakayama, MD;  Location: [facility];  Service: Open Heart Surgery;  Laterality: N/A;  . ULTRASOUND GUIDANCE FOR VASCULAR ACCESS  05/05/2018   Procedure: Ultrasound Guidance For Vascular Access;  Surgeon: Leonie Man, MD;  Location: [facility];  Service: Cardiovascular;;    Current Medications: Current Meds  Medication Sig  . aspirin EC 81 MG EC tablet Take 1 tablet (81 mg total) by mouth daily.  Marland Kitchen  atorvastatin (LIPITOR) 80 MG tablet TAKE 1 TABLET BY MOUTH EVERY DAY AT 6 PM  . Blood Glucose Monitoring Suppl (ONE TOUCH ULTRA 2) w/Device KIT   . carvedilol (COREG) 25 MG tablet TAKE 1 TABLET BY MOUTH TWICE A DAY  . esomeprazole (NEXIUM) 40 MG capsule Take 1 capsule (40 mg total) by mouth at bedtime.  . Lancets (ONETOUCH DELICA PLUS [id number]) MISC   . lisinopril (ZESTRIL) 5 MG tablet Take 1 tablet (5 mg total) by  mouth daily.  Letta Pate ULTRA test strip   . Simethicone (GAS-X EXTRA STRENGTH PO) Take by mouth. Gel caps uses as needed  . spironolactone (ALDACTONE) 25 MG tablet Take 0.5 tablets (12.5 mg total) by mouth daily.     Allergies:   Patient has no known allergies.   Social History   Socioeconomic History  . Marital status: Single    Spouse name: Not on file  . Number of children: Not on file  . Years of education: Not on file  . Highest education level: Not on file  Occupational History  . Not on file  Tobacco Use  . Smoking status: Former Games developer  . Smokeless tobacco: Never Used  Vaping Use  . Vaping Use: Never used  Substance and Sexual Activity  . Alcohol use: Never  . Drug use: Never  . Sexual activity: Not on file  Other Topics Concern  . Not on file  Social History Narrative  . Not on file   Social Determinants of Health   Financial Resource Strain: Not on file  Food Insecurity: Not on file  Transportation Needs: Not on file  Physical Activity: Not on file  Stress: Not on file  Social Connections: Not on file     Family History: The patient's family history includes CAD in his brother; CVA in his father.  ROS:   Please see the history of present illness.    Review of Systems  Constitutional: Negative for chills and fever.  HENT: Negative for hearing loss.   Eyes: Negative for blurred vision and redness.  Respiratory: Negative for shortness of breath.   Cardiovascular: Negative for chest pain, palpitations, orthopnea, claudication and leg swelling.  Gastrointestinal: Positive for abdominal pain and heartburn. Negative for blood in stool and melena.  Genitourinary: Positive for frequency.  Musculoskeletal: Negative for falls.  Neurological: Negative for dizziness and loss of consciousness.  Endo/Heme/Allergies: Negative for polydipsia.  Psychiatric/Behavioral: Negative for substance abuse.    EKGs/Labs/Other Studies Reviewed:    The following studies  were reviewed today: TTE 08/21/19: 1. Left ventricular ejection fraction, by estimation, is 25 to 30%. The  left ventricle has severely decreased function. The left ventricle  demonstrates global hypokinesis. There is mild left ventricular  hypertrophy. Left ventricular diastolic parameters  are consistent with Grade II diastolic dysfunction (pseudonormalization).  Elevated left atrial pressure. Patient refused Definity.  2. Right ventricular systolic function is mildly reduced. The right  ventricular size is mildly enlarged.  3. The mitral valve has been repaired/replaced. There is a 32 mm  prosthetic annuloplasty ring present in the mitral position. Trivial  regurgitation. Mean gradient 4 mmHg.  4. The aortic valve was not well visualized. Aortic valve regurgitation  is mild.  5. Compared to prior TTE on 01/31/19, no significant change in LV systolic  Function  Cath 05/05/18:  ISCHEMIC CARDIOMYOPATHY  Hemodynamic findings consistent with moderate pulmonary hypertension -secondary to pulmonary venous hypertension  LV end diastolic pressure and pulmonary capillary wedge pressure are moderately elevated.  There is severe (4+) mitral regurgitation.  SEVERE MULTIVESSEL DISEASE  Prox RCA to Mid RCA lesion is 90% stenosed. Mid RCA to Dist RCA lesion is 100% stenosed with 100% stenosed side branch in Post Atrio.  Prox LAD lesion is 99% stenosed. Mid LAD-1 lesion is 80% stenosed. Mid LAD-2 lesion is 90% stenosed. Dist LAD lesion is 85% stenosed.  Prox Cx lesion is 95% stenosed. 2nd Mrg lesion is 90% stenosed with 90% stenosed side branch in Lat 2nd Mrg.   SUMMARY  Severe multivessel CAD: Diffuse proximal RCA disease with mid 100% CTO filling with bridging collaterals and right-to-left collaterals, separate ostium circumflex with proximal 95% stenosis and bifurcation 90% stenosis of OM1, 99% mid LAD after very large septal perforator trunk (perfusing PDA) with bridging collaterals  filling mid to distal LAD with a large diagonal branch and diffuse disease throughout the distal LAD.  Moderate pulmonary hypertension likely secondary to pulmonary venous congestion and mitral regurgitation.  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   RECOMMENDATION  Return to nursing unit for TR band removal.  Continue aggressive CHF management  The patient would likely best benefit from CABG plus possible mitral valve repair. --We will consult CVTS  Will place on IV heparin 8 hours after sheath removal  Consider viability study as part of assessment for revascularization options.  Percutaneous options would be limited and extremely high risk given his low output and multivessel disease.  Would likely need support with Impella  Recommend Aspirin $RemoveBeforeDEI'81mg'[id number]$  daily for severe CAD.  Ultrasound 05/21/20: Abdominal Aorta: There is evidence of abnormal dilatation of the distal  Abdominal aorta. Previous diameter measurement was 4.6 cm obtained on  08/21/19 by CTA.     Recent Labs: No results found for requested labs within last 8760 hours.  Recent Lipid Panel    Component Value Date/Time   CHOL 122 08/03/2018 0819   TRIG 76 08/03/2018 0819   HDL 46 08/03/2018 0819   CHOLHDL 2.7 08/03/2018 0819   CHOLHDL 4.2 05/04/2018 0433   VLDL 11 05/04/2018 0433   LDLCALC 61 08/03/2018 0819     Risk Assessment/Calculations:       Physical Exam:    VS:  BP 108/64   Pulse (!) 59   Ht $R'5\' 9"'eZ$  (1.753 m)   Wt 187 lb 9.6 oz (85.1 kg)   SpO2 97%   BMI 27.70 kg/m     Wt Readings from Last 3 Encounters:  09/19/20 187 lb 9.6 oz (85.1 kg)  05/21/20 184 lb (83.5 kg)  12/15/19 186 lb 9.6 oz (84.6 kg)     GEN:  Well nourished, well  developed in no acute distress HEENT: Normal NECK: No JVD; No carotid bruits CARDIAC: RRR, no murmurs, rubs, gallops RESPIRATORY:  Clear to auscultation without rales, wheezing or rhonchi  ABDOMEN: Soft, non-tender, non-distended MUSCULOSKELETAL:  No edema; No deformity  SKIN: Warm and dry NEUROLOGIC:  Alert and oriented x 3 PSYCHIATRIC:  Normal affect   ASSESSMENT:    1. 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 Chronic systolic heart failure ([place])   3. Medication management   4. Gastroesophageal reflux disease, unspecified whether esophagitis present   5. Hyperlipidemia, unspecified hyperlipidemia type   6. Ischemic cardiomyopathy   7. Essential hypertension  8. AAA (abdominal aortic aneurysm) without rupture ([place])   9. Nonrheumatic mitral valve regurgitation   10. H/O five vessel coronary artery bypass   11. S/P mitral valve repair    PLAN:    In order of problems listed above:  #Ischemic cardiomyopathy: TTE 08/2019 with LVEF 25-30%. Compensated and euvolemic on examination with NYHA class II symptoms. Tolerating all medications -Continue lisinopril  -Continue coreg -Could not afford entresto -Start spironolactone 12.5mg  daily -Consider SGLT-2 inhibitor at next visit -Check BMET next week -Repeat TTE and if EF <35%, will refer for ICD  #CAD s/p 5V CABG in 2019: No anginal symptoms. -Continue ASA and lipitor 80mg  daily -Continue coreg and lisinopril as above -Start spironolactone as above  #AAA: Monitored by Dr. Carlis Abbott. Measures 4.89 on most recent ultrasound 05/21/20. -Continue surveillance ultrasounds q47months per Dr. Carlis Abbott -Threshold to repair is 5.5cm unless rapid growth or symptoms -Continue aggressive blood pressure control -ASA and statin as above -Follow-up with Dr. Carlis Abbott as scheduled   #HTN: Controlled -Continue coreg and lisinopril as above -Start spironolactone 12.5mg  as above  #HLD: LDL at goal at 50. -Continue lipitor 80mg   daily  #DMII: Diet controlled. A1C 6.5. -Continue lifestyle modifications -Consider SGLT2 inhibitor at next visit  #GERD: -Start nexium 40mg  daily  Medication Adjustments/Labs and Tests Ordered: Current medicines are reviewed at length with the patient today.  Concerns regarding medicines are outlined above.  Orders Placed This Encounter  Procedures  . Basic metabolic panel  . EKG 12-Lead  . ECHOCARDIOGRAM COMPLETE   Meds ordered this encounter  Medications  . spironolactone (ALDACTONE) 25 MG tablet    Sig: Take 0.5 tablets (12.5 mg total) by mouth daily.    Dispense:  45 tablet    Refill:  3  . esomeprazole (NEXIUM) 40 MG capsule    Sig: Take 1 capsule (40 mg total) by mouth at bedtime.    Dispense:  90 capsule    Refill:  1    Patient Instructions  Medication Instructions:   START TAKING SPIRONOLACTONE 12.5 MG BY MOUTH DAILY  START TAKING NEXIUM 40 MG BY MOUTH DAILY AT BEDTIME  *If you need a refill on your cardiac medications before your next appointment, please call your pharmacy*   Lab Work:  IN ONE WEEK--BMET  If you have labs (blood work) drawn today and your tests are completely normal, you will receive your results only by: Marland Kitchen MyChart Message (if you have MyChart) OR . A paper copy in the mail If you have any lab test that is abnormal or we need to change your treatment, we will call you to review the results.   Testing/Procedures:  Your physician has requested that you have an echocardiogram. Echocardiography is a painless test that uses sound waves to create images of your heart. It provides your doctor with information about the size and shape of your heart and how well your heart's chambers and valves are working. This procedure takes approximately one hour. There are no restrictions for this procedure.   Follow-Up: At [facility], you and your health needs are our priority.  As part of our continuing mission to provide you with exceptional heart  care, we have created designated Provider Care Teams.  These Care Teams include your primary Cardiologist (physician) and Advanced Practice Providers (APPs -  Physician Assistants and Nurse Practitioners) who all work together to provide you with the care you need, when you need it.  We recommend signing up for the patient portal called "  MyChart".  Sign up information is provided on this After Visit Summary.  MyChart is used to connect with patients for Virtual Visits (Telemedicine).  Patients are able to view lab/test results, encounter notes, upcoming appointments, etc.  Non-urgent messages can be sent to your provider as well.   To learn more about what you can do with MyChart, go to [email or web address].    Your next appointment:   6 month(s)  The format for your next appointment:   In Person  Provider:   You will see one of the following Advanced Practice Providers on your designated Care Team:    Richardson Dopp, PA-C  Robbie Lis, [place]        Signed, Freada Bergeron, MD  09/19/2020 12:52 PM    [place]

## 2020-09-19 ENCOUNTER — Ambulatory Visit (INDEPENDENT_AMBULATORY_CARE_PROVIDER_SITE_OTHER): Payer: Medicare HMO | Admitting: Cardiology

## 2020-09-19 ENCOUNTER — Other Ambulatory Visit: Payer: Self-pay

## 2020-09-19 ENCOUNTER — Encounter: Payer: Self-pay | Admitting: Cardiology

## 2020-09-19 VITALS — BP 108/64 | HR 59 | Ht 69.0 in | Wt 187.6 lb

## 2020-09-19 DIAGNOSIS — I10 Essential (primary) hypertension: Secondary | ICD-10-CM | POA: Diagnosis not present

## 2020-09-19 DIAGNOSIS — I714 Abdominal aortic aneurysm, without rupture, unspecified: Secondary | ICD-10-CM

## 2020-09-19 DIAGNOSIS — I255 Ischemic cardiomyopathy: Secondary | ICD-10-CM | POA: Diagnosis not present

## 2020-09-19 DIAGNOSIS — E785 Hyperlipidemia, unspecified: Secondary | ICD-10-CM | POA: Diagnosis not present

## 2020-09-19 DIAGNOSIS — I340 Nonrheumatic mitral (valve) insufficiency: Secondary | ICD-10-CM | POA: Diagnosis not present

## 2020-09-19 DIAGNOSIS — I5022 Chronic systolic (congestive) heart failure: Secondary | ICD-10-CM

## 2020-09-19 DIAGNOSIS — Z9889 Other specified postprocedural states: Secondary | ICD-10-CM

## 2020-09-19 DIAGNOSIS — I25810 Atherosclerosis of coronary artery bypass graft(s) without angina pectoris: Secondary | ICD-10-CM | POA: Diagnosis not present

## 2020-09-19 DIAGNOSIS — Z951 Presence of aortocoronary bypass graft: Secondary | ICD-10-CM | POA: Diagnosis not present

## 2020-09-19 DIAGNOSIS — Z79899 Other long term (current) drug therapy: Secondary | ICD-10-CM

## 2020-09-19 DIAGNOSIS — K219 Gastro-esophageal reflux disease without esophagitis: Secondary | ICD-10-CM

## 2020-09-19 MED ORDER — SPIRONOLACTONE 25 MG PO TABS: 12.5000 mg | ORAL_TABLET | Freq: Every day | ORAL | 3 refills | Status: DC

## 2020-09-19 MED ORDER — ESOMEPRAZOLE MAGNESIUM 40 MG PO CPDR: 40.0000 mg | DELAYED_RELEASE_CAPSULE | Freq: Every day | ORAL | 1 refills | Status: DC

## 2020-09-19 NOTE — Patient Instructions (Signed)
Medication Instructions:   START TAKING SPIRONOLACTONE 12.5 MG BY MOUTH DAILY  START TAKING NEXIUM 40 MG BY MOUTH DAILY AT BEDTIME  *If you need a refill on your cardiac medications before your next appointment, please call your pharmacy*   Lab Work:  IN ONE WEEK--BMET  If you have labs (blood work) drawn today and your tests are completely normal, you will receive your results only by: Marland Kitchen MyChart Message (if you have MyChart) OR . A paper copy in the mail If you have any lab test that is abnormal or we need to change your treatment, we will call you to review the results.   Testing/Procedures:  Your physician has requested that you have an echocardiogram. Echocardiography is a painless test that uses sound waves to create images of your heart. It provides your doctor with information about the size and shape of your heart and how well your heart's chambers and valves are working. This procedure takes approximately one hour. There are no restrictions for this procedure.   Follow-Up: At [facility], you and your health needs are our priority.  As part of our continuing mission to provide you with exceptional heart care, we have created designated Provider Care Teams.  These Care Teams include your primary Cardiologist (physician) and Advanced Practice Providers (APPs -  Physician Assistants and Nurse Practitioners) who all work together to provide you with the care you need, when you need it.  We recommend signing up for the patient portal called "MyChart".  Sign up information is provided on this After Visit Summary.  MyChart is used to connect with patients for Virtual Visits (Telemedicine).  Patients are able to view lab/test results, encounter notes, upcoming appointments, etc.  Non-urgent messages can be sent to your provider as well.   To learn more about what you can do with MyChart, go to [email or web address].    Your next appointment:   6 month(s)  The format for your  next appointment:   In Person  Provider:   You will see one of the following Advanced Practice Providers on your designated Care Team:    Tereso Newcomer, PA-C  Vin Murdock, [place]

## 2020-09-23 ENCOUNTER — Telehealth: Payer: Self-pay | Admitting: Cardiology

## 2020-09-23 NOTE — Telephone Encounter (Signed)
New message:    Patient calling stating that he need a prescription for his second booster shot. Please advise he was not to sure. That was  For [facility]

## 2020-09-23 NOTE — Telephone Encounter (Signed)
Pt was calling in asking where he can get his 2nd booster covid vaccine at, for all the pharmacies state they do not have that supply on hand at this time.  Informed the pt that accordingly to the news, the funds have not been provided from Congress yet to provide the [place] with the second booster for age appropriate pts to receive thus far.  Advised the pt to continue watching his local news for updates and continue following up with his pharmacies for updates. Pt verbalized understanding and agrees with this plan.

## 2020-09-26 ENCOUNTER — Other Ambulatory Visit: Payer: Self-pay

## 2020-09-26 ENCOUNTER — Other Ambulatory Visit: Payer: Medicare HMO

## 2020-09-26 DIAGNOSIS — I5022 Chronic systolic (congestive) heart failure: Secondary | ICD-10-CM | POA: Diagnosis not present

## 2020-09-26 DIAGNOSIS — Z79899 Other long term (current) drug therapy: Secondary | ICD-10-CM | POA: Diagnosis not present

## 2020-09-26 DIAGNOSIS — I25810 Atherosclerosis of coronary artery bypass graft(s) without angina pectoris: Secondary | ICD-10-CM

## 2020-09-26 LAB — BASIC METABOLIC PANEL
BUN/Creatinine Ratio: 13 (ref 10–24)
BUN: 18 mg/dL (ref 8–27)
CO2: 23 mmol/L (ref 20–29)
Calcium: 8.9 mg/dL (ref 8.6–10.2)
Chloride: 104 mmol/L (ref 96–106)
Creatinine, Ser: 1.4 mg/dL — ABNORMAL HIGH (ref 0.76–1.27)
Glucose: 125 mg/dL — ABNORMAL HIGH (ref 65–99)
Potassium: 5.2 mmol/L (ref 3.5–5.2)
Sodium: 139 mmol/L (ref 134–144)
eGFR: 55 mL/min/{1.73_m2} — ABNORMAL LOW (ref >59)

## 2020-09-27 ENCOUNTER — Telehealth: Payer: Self-pay | Admitting: *Deleted

## 2020-09-27 DIAGNOSIS — R7989 Other specified abnormal findings of blood chemistry: Secondary | ICD-10-CM

## 2020-09-27 DIAGNOSIS — Z79899 Other long term (current) drug therapy: Secondary | ICD-10-CM

## 2020-09-27 DIAGNOSIS — Z0189 Encounter for other specified special examinations: Secondary | ICD-10-CM

## 2020-09-27 DIAGNOSIS — I5022 Chronic systolic (congestive) heart failure: Secondary | ICD-10-CM

## 2020-09-27 NOTE — Telephone Encounter (Signed)
Pt made aware of lab results and recommendations per Dr. Shari Prows, for him to STOP his spironolactone and come back into the office next Friday 4/15 to have a repeat BMET done, to reassess renal function.  Scheduled the pt a lab appt here at the office for 4/15.  Pt verbalized understanding and agrees with this plan.

## 2020-09-27 NOTE — Telephone Encounter (Signed)
-----   Message from Meriam Sprague, MD sent at 09/26/2020  6:02 PM EDT ----- His Cr and K went up a little too much with spironolactone. We should stop it and repeat BMET in 1 week to ensure he is back to baseline.

## 2020-10-04 ENCOUNTER — Other Ambulatory Visit: Payer: Self-pay

## 2020-10-04 ENCOUNTER — Other Ambulatory Visit: Payer: Medicare HMO | Admitting: *Deleted

## 2020-10-04 DIAGNOSIS — R7989 Other specified abnormal findings of blood chemistry: Secondary | ICD-10-CM | POA: Diagnosis not present

## 2020-10-04 DIAGNOSIS — Z79899 Other long term (current) drug therapy: Secondary | ICD-10-CM

## 2020-10-04 DIAGNOSIS — I5022 Chronic systolic (congestive) heart failure: Secondary | ICD-10-CM

## 2020-10-04 DIAGNOSIS — Z0189 Encounter for other specified special examinations: Secondary | ICD-10-CM

## 2020-10-05 LAB — BASIC METABOLIC PANEL
BUN/Creatinine Ratio: 13 (ref 10–24)
BUN: 25 mg/dL (ref 8–27)
CO2: 21 mmol/L (ref 20–29)
Calcium: 8.8 mg/dL (ref 8.6–10.2)
Chloride: 104 mmol/L (ref 96–106)
Creatinine, Ser: 1.93 mg/dL — ABNORMAL HIGH (ref 0.76–1.27)
Glucose: 129 mg/dL — ABNORMAL HIGH (ref 65–99)
Potassium: 5.2 mmol/L (ref 3.5–5.2)
Sodium: 137 mmol/L (ref 134–144)
eGFR: 37 mL/min/{1.73_m2} — ABNORMAL LOW (ref >59)

## 2020-10-07 ENCOUNTER — Other Ambulatory Visit: Payer: Self-pay | Admitting: Cardiology

## 2020-10-07 ENCOUNTER — Telehealth: Payer: Self-pay | Admitting: *Deleted

## 2020-10-07 DIAGNOSIS — R7989 Other specified abnormal findings of blood chemistry: Secondary | ICD-10-CM

## 2020-10-07 DIAGNOSIS — I5022 Chronic systolic (congestive) heart failure: Secondary | ICD-10-CM

## 2020-10-07 NOTE — Telephone Encounter (Signed)
-----   Message from Meriam Sprague, MD sent at 10/07/2020  9:13 AM EDT ----- His Cr is worsening despite stopping spironolactone. I am afraid he is having fluid come on board and making his renal function worse. Is he gaining weight, swelling, becoming more SOB? Have any new medications been added? We need to figure out why his renal function is worsening. Can we bring him for a BNP and repeat BMET to verify this set of labs.

## 2020-10-07 NOTE — Telephone Encounter (Signed)
Advised pt of lab results. Pt states he only had some SOB last week for about 10 minutes with no recurrence since then. Pt states he has additional fluid build up. Pt states he drinks about 4 glasses of tea and a couple of glasses of water. Pt confirms no new medications have been added to his regimen. Pt is agreeable to come in for additional lab work on Wed 10/09/20 BMET, PRO BNP have been scheduled. Pt thanked me for the call and the help. The patient has been notified of the result and verbalized understanding.  All questions (if any) were answered. Danielle Rankin, [facility] 10/07/2020 10:38 AM

## 2020-10-08 NOTE — Progress Notes (Deleted)
Cardiology Office Note:    Date:  10/08/2020   ID:  Michael Shutterhilip E Heberle, DOB Nov 18, 1950, MRN [id number]  PCP:  Meriam SpraguePemberton, Ardian Haberland E, MD   [facility]  Cardiologist:  Tobias AlexanderKatarina Nelson, MD Advanced Practice Provider:  No care team member to display Electrophysiologist:  None   Referring MD: Meriam SpraguePemberton, Livia Tarr E, MD    History of Present Illness:    Michael Banks is a 70 y.o. male with a hx of CAD s/p 5v CABG in 2019 with MVR, hypertension, HLD, tobacco abuse, and DMII who was previously followed by Dr. Delton SeeNelson who now returns to clinic for follow-up.  The patient was admitted 04/2018 with shortness of breath and orthopnea. Echo showed severely reduced LV function ejection fraction 20 to 25% with moderate MR. Cardiac catheterization revealed severe CAD and severe MR. Mild cardio viability study revealed viability in 12 out of 17 segments. He underwent CABG x5 and MV repair 05/13/2018. Briefly had atrial fibrillation postop converted to normal sinus rhythm with amiodarone.He is to be maintained on Coumadin and aspirin perCVTS. No ace inhibitor or ARB or Spironolactone given because of elevated creatinine in the hospital.  Last saw Dr. Delton SeeNelson on 10/06/19 where he was doing very well. Was not able to start entresto due to cost. TTE 08/21/19 with LVEF 25-30%, G2DD, trivial MR with mean gradient 4mmHg. Noted to have AAA which is now monitored by Dr. Chestine Sporelark. Currently measures 4.89cm; plan to intervene if greater than 5.5.  During last visit on 09/19/20, the patient was doing well. We started him on spironolactone but Cr rose to 1.4. We stopped it and re-check his Cr the next week and it continued to rise to 1.9 despite stopping the medication. Given concern for possible fluid overload, the patient was brought back to clinic for further evaluation.  Past Medical History:  Diagnosis Date  . Arthritis   . Diabetes (HCC)   . Dilated cardiomyopathy (HCC)   . Gallstones    . Hydronephrosis   . Hyperglycemia   . Hypertension   . Kidney stone   . UTI (urinary tract infection)     Past Surgical History:  Procedure Laterality Date  . CORONARY ARTERY BYPASS GRAFT N/A 05/13/2018   Procedure: CORONARY ARTERY BYPASS GRAFTING (CABG) times _five__ using left internal mammary artery and right  greater saphenous vein harvested endoscopically.;  Surgeon: Loreli SlotHendrickson, Steven C, MD;  [facility];  Service: Open Heart Surgery;  Laterality: N/A;  . KNEE ARTHROSCOPY    . MITRAL VALVE REPAIR N/A 05/13/2018   Procedure: Mitral Valve Repair;  Surgeon: Loreli SlotHendrickson, Steven C, MD;  [facility];  Service: Open Heart Surgery;  Laterality: N/A;  . NOSE SURGERY    . RIGHT/LEFT HEART CATH AND CORONARY ANGIOGRAPHY N/A 05/05/2018   Procedure: RIGHT/LEFT HEART CATH AND CORONARY ANGIOGRAPHY;  Surgeon: Marykay LexHarding, David W, MD;  [facility];  Service: Cardiovascular;  Laterality: N/A;  . TEE WITHOUT CARDIOVERSION N/A 05/13/2018   Procedure: TRANSESOPHAGEAL ECHOCARDIOGRAM (TEE);  Surgeon: Loreli SlotHendrickson, Steven C, MD;  [facility];  Service: Open Heart Surgery;  Laterality: N/A;  . ULTRASOUND GUIDANCE FOR VASCULAR ACCESS  05/05/2018   Procedure: Ultrasound Guidance For Vascular Access;  Surgeon: Marykay LexHarding, David W, MD;  [facility];  Service: Cardiovascular;;    Current Medications: No outpatient medications have been marked as taking for the 10/09/20 encounter (Appointment) with Meriam SpraguePemberton, Yarelie Hams E, MD.     Allergies:   Patient has no  known allergies.   Social History   Socioeconomic History  . Marital status: Single    Spouse name: Not on file  . Number of children: Not on file  . Years of education: Not on file  . Highest education level: Not on file  Occupational History  . Not on file  Tobacco Use  . Smoking status: Former Games developer  . Smokeless tobacco: Never Used  Vaping Use  . Vaping Use: Never used  Substance and Sexual Activity   . Alcohol use: Never  . Drug use: Never  . Sexual activity: Not on file  Other Topics Concern  . Not on file  Social History Narrative  . Not on file   Social Determinants of Health   Financial Resource Strain: Not on file  Food Insecurity: Not on file  Transportation Needs: Not on file  Physical Activity: Not on file  Stress: Not on file  Social Connections: Not on file     Family History: The patient's family history includes CAD in his brother; CVA in his father.  ROS:   Please see the history of present illness.    Review of Systems  Constitutional: Negative for chills and fever.  HENT: Negative for hearing loss.   Eyes: Negative for blurred vision and redness.  Respiratory: Negative for shortness of breath.   Cardiovascular: Negative for chest pain, palpitations, orthopnea, claudication and leg swelling.  Gastrointestinal: Positive for heartburn. Negative for blood in stool and melena.  Musculoskeletal: Negative for falls.  Neurological: Negative for dizziness and loss of consciousness.  Endo/Heme/Allergies: Negative for polydipsia.  Psychiatric/Behavioral: Negative for substance abuse.    EKGs/Labs/Other Studies Reviewed:    The following studies were reviewed today: TTE September 12, 2019: 1. Left ventricular ejection fraction, by estimation, is 25 to 30%. The  left ventricle has severely decreased function. The left ventricle  demonstrates global hypokinesis. There is mild left ventricular  hypertrophy. Left ventricular diastolic parameters  are consistent with Grade II diastolic dysfunction (pseudonormalization).  Elevated left atrial pressure. Patient refused Definity.  2. Right ventricular systolic function is mildly reduced. The right  ventricular size is mildly enlarged.  3. The mitral valve has been repaired/replaced. There is a 32 mm  prosthetic annuloplasty ring present in the mitral position. Trivial  regurgitation. Mean gradient 4 mmHg.  4. The aortic  valve was not well visualized. Aortic valve regurgitation  is mild.  5. Compared to prior TTE on 01/31/19, no significant change in LV systolic  Function  Cath 05/05/18:  ISCHEMIC CARDIOMYOPATHY  Hemodynamic findings consistent with moderate pulmonary hypertension -secondary to pulmonary venous hypertension  LV end diastolic pressure and pulmonary capillary wedge pressure are moderately elevated.  There is severe (4+) mitral regurgitation.  SEVERE MULTIVESSEL DISEASE  Prox RCA to Mid RCA lesion is 90% stenosed. Mid RCA to Dist RCA lesion is 100% stenosed with 100% stenosed side branch in Post Atrio.  Prox LAD lesion is 99% stenosed. Mid LAD-1 lesion is 80% stenosed. Mid LAD-2 lesion is 90% stenosed. Dist LAD lesion is 85% stenosed.  Prox Cx lesion is 95% stenosed. 2nd Mrg lesion is 90% stenosed with 90% stenosed side branch in Lat 2nd Mrg.   SUMMARY  Severe multivessel CAD: Diffuse proximal RCA disease with mid 100% CTO filling with bridging collaterals and right-to-left collaterals, separate ostium circumflex with proximal 95% stenosis and bifurcation 90% stenosis of OM1, 99% mid LAD after very large septal perforator trunk (perfusing PDA) with bridging collaterals filling mid to distal LAD with a  large diagonal branch and diffuse disease throughout the distal LAD.  Moderate pulmonary hypertension likely secondary to pulmonary venous congestion and mitral regurgitation.  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   RECOMMENDATION  Return to nursing unit for TR band removal.  Continue aggressive CHF management  The patient would likely best benefit from CABG plus possible mitral valve repair. --We will consult  CVTS  Will place on IV heparin 8 hours after sheath removal  Consider viability study as part of assessment for revascularization options.  Percutaneous options would be limited and extremely high risk given his low output and multivessel disease.  Would likely need support with Impella  Recommend Aspirin 81mg  daily for severe CAD.  Ultrasound 05/21/20: Abdominal Aorta: There is evidence of abnormal dilatation of the distal  Abdominal aorta. Previous diameter measurement was 4.6 cm obtained on  08/21/19 by CTA.     Recent Labs: 10/04/2020: BUN 25; Creatinine, Ser 1.93; Potassium 5.2; Sodium 137  Recent Lipid Panel    Component Value Date/Time   CHOL 122 08/03/2018 0819   TRIG 76 08/03/2018 0819   HDL 46 08/03/2018 0819   CHOLHDL 2.7 08/03/2018 0819   CHOLHDL 4.2 05/04/2018 0433   VLDL 11 05/04/2018 0433   LDLCALC 61 08/03/2018 0819     Risk Assessment/Calculations:       Physical Exam:    VS:  There were no vitals taken for this visit.    Wt Readings from Last 3 Encounters:  09/19/20 187 lb 9.6 oz (85.1 kg)  05/21/20 184 lb (83.5 kg)  12/15/19 186 lb 9.6 oz (84.6 kg)     GEN:  Well nourished, well developed in no acute distress HEENT: Normal NECK: No JVD; No carotid bruits CARDIAC: RRR, no murmurs, rubs, gallops RESPIRATORY:  Clear to auscultation without rales, wheezing or rhonchi  ABDOMEN: Soft, non-tender, non-distended MUSCULOSKELETAL:  No edema; No deformity  SKIN: Warm and dry NEUROLOGIC:  Alert and oriented x 3 PSYCHIATRIC:  Normal affect   ASSESSMENT:    No diagnosis found. PLAN:    In order of problems listed above:  #AKI on CKD: Cr initial rose to 1.4 after starting spironolactone. Medication stopped and Cr rose further to 1.9 with concern for possible volume overload. Brought in for urgent visit today. -Check BNP -Check BMET today  #Ischemic cardiomyopathy: TTE 08/2019 with LVEF 25-30%.  Now with rising Cr after stopping spironolactone.    -Check BNP and BMET -Continue lisinopril  -Continue coreg -Could not afford entresto -Holding spirinolactone for now given rise in Cr and K -Consider SGLT-2 inhibitor once Cr back to normal -Repeat TTE and if EF <35%, will refer for ICD  #CAD s/p 5V CABG with MVR in 2019: No anginal symptoms. -Continue ASA and lipitor 80mg  daily -Continue coreg and lisinopril as above  #AAA: Monitored by Dr. 2020. Measures 4.89 on most recent ultrasound 05/21/20. -Continue surveillance ultrasounds q4months per Dr. 05/23/20 -Threshold to repair is 5.5cm unless rapid growth or symptoms -Continue aggressive blood pressure control -ASA and statin as above -Follow-up with Dr. 11month as scheduled  #HTN: Controlled -Continue coreg and lisinopril as above  #HLD: LDL at goal at 50. -Continue lipitor 80mg  daily  #DMII: Diet controlled. A1C 6.5. -Continue lifestyle modifications -Consider SGLT2 inhibitor at next visit pending  renal function  #GERD: -Continue nexium 40mg  daily  Medication Adjustments/Labs and Tests Ordered: Current medicines are reviewed at length with the patient today.  Concerns regarding medicines are outlined above.  No orders of the defined types were placed in this encounter.  No orders of the defined types were placed in this encounter.   There are no Patient Instructions on file for this visit.   Signed, , MD  10/08/2020 8:24 PM    [facility]

## 2020-10-09 ENCOUNTER — Encounter: Payer: Self-pay | Admitting: Cardiology

## 2020-10-09 ENCOUNTER — Other Ambulatory Visit: Payer: Self-pay

## 2020-10-09 ENCOUNTER — Ambulatory Visit (INDEPENDENT_AMBULATORY_CARE_PROVIDER_SITE_OTHER): Payer: Medicare HMO | Admitting: Cardiology

## 2020-10-09 ENCOUNTER — Other Ambulatory Visit: Payer: Medicare HMO | Admitting: *Deleted

## 2020-10-09 VITALS — BP 132/70 | HR 62 | Ht 69.0 in | Wt 189.0 lb

## 2020-10-09 DIAGNOSIS — R7989 Other specified abnormal findings of blood chemistry: Secondary | ICD-10-CM | POA: Diagnosis not present

## 2020-10-09 DIAGNOSIS — I255 Ischemic cardiomyopathy: Secondary | ICD-10-CM | POA: Diagnosis not present

## 2020-10-09 DIAGNOSIS — I714 Abdominal aortic aneurysm, without rupture, unspecified: Secondary | ICD-10-CM

## 2020-10-09 DIAGNOSIS — N200 Calculus of kidney: Secondary | ICD-10-CM | POA: Diagnosis not present

## 2020-10-09 DIAGNOSIS — I340 Nonrheumatic mitral (valve) insufficiency: Secondary | ICD-10-CM

## 2020-10-09 DIAGNOSIS — N179 Acute kidney failure, unspecified: Secondary | ICD-10-CM

## 2020-10-09 DIAGNOSIS — I25810 Atherosclerosis of coronary artery bypass graft(s) without angina pectoris: Secondary | ICD-10-CM | POA: Diagnosis not present

## 2020-10-09 DIAGNOSIS — I5022 Chronic systolic (congestive) heart failure: Secondary | ICD-10-CM

## 2020-10-09 DIAGNOSIS — E785 Hyperlipidemia, unspecified: Secondary | ICD-10-CM | POA: Diagnosis not present

## 2020-10-09 NOTE — Patient Instructions (Signed)
Medication Instructions:   Your physician recommends that you continue on your current medications as directed. Please refer to the Current Medication list given to you today.  *If you need a refill on your cardiac medications before your next appointment, please call your pharmacy*   Lab Work:  TODAY--URINALYSIS  If you have labs (blood work) drawn today and your tests are completely normal, you will receive your results only by: Marland Kitchen MyChart Message (if you have MyChart) OR . A paper copy in the mail If you have any lab test that is abnormal or we need to change your treatment, we will call you to review the results.   Follow-Up:  3 MONTHS IN THE OFFICE WITH DR. Shari Prows

## 2020-10-09 NOTE — Progress Notes (Signed)
Cardiology Office Note:    Date:  10/09/2020   ID:  Michael Banks, DOB 03-Feb-1951, MRN [id number]  PCP:  Freada Bergeron, MD   [place]  Cardiologist:  Ena Dawley, MD Advanced Practice Provider:  No care team member to display Electrophysiologist:  None   Referring MD: Freada Bergeron, MD    History of Present Illness:    Michael Banks is a 70 y.o. male with a hx of CAD s/p 5v CABG in 2019 with MVR, hypertension, HLD, tobacco abuse, and DMII who was previously followed by Dr. Meda Coffee who now returns to clinic for follow-up.  The patient was admitted 04/2018 with shortness of breath and orthopnea. Echo showed severely reduced LV function ejection fraction 20 to 25% with moderate MR. Cardiac catheterization revealed severe CAD and severe MR. Mild cardio viability study revealed viability in 12 out of 17 segments. He underwent CABG x5 and MV repair 05/13/2018. Briefly had atrial fibrillation postop converted to normal sinus rhythm with amiodarone.He is to be maintained on Coumadin and aspirin perCVTS. No ace inhibitor or ARB or Spironolactone given because of elevated creatinine in the hospital.  Last saw Dr. Meda Coffee on 10/06/19 where he was doing very well. Was not able to start entresto due to cost. TTE 08/21/19 with LVEF 25-30%, G2DD, trivial MR with mean gradient 25mHg. Noted to have AAA which is now monitored by Dr. CCarlis Abbott Currently measures 4.89cm; plan to intervene if greater than 5.5.  During last visit on 09/19/20, the patient was doing well. We started him on spironolactone but Cr rose to 1.4. We stopped it and re-check his Cr the next week and it continued to rise to 1.9 despite stopping the medication. Given concern for possible fluid overload, the patient was brought back to clinic for further evaluation.  Today, he says he is feeling well. He states that at the time we started his spironolactone he had developed flank pain  similar to the pain he has experienced with his kidney stones in the past. He also noted some change in the color of his urine. Fortunately, these symptoms resolved after his last lab draw and his urine is clear. He thinks this may be the reason his kidney function changed with the labs. He denies any weight gain, LE edema, orthopnea or PND. His weight remains steady, and he has lost an inch off his waist. He states he "always feels chest discomfort" that he attributes to his acid reflux. Lately he is trying to exercise more often. He is no longer a smoker. Currently scheduled for a repeat ECHO on 10/24/19.    Past Medical History:  Diagnosis Date  . Arthritis   . Diabetes ([place]   . Dilated cardiomyopathy ([place]   . Gallstones   . Hydronephrosis   . Hyperglycemia   . Hypertension   . Kidney stone   . UTI (urinary tract infection)     Past Surgical History:  Procedure Laterality Date  . CORONARY ARTERY BYPASS GRAFT N/A 05/13/2018   Procedure: CORONARY ARTERY BYPASS GRAFTING (CABG) times _five__ using left internal mammary artery and right  greater saphenous vein harvested endoscopically.;  Surgeon: HMelrose Nakayama MD;  [place]  Service: Open Heart Surgery;  Laterality: N/A;  . KNEE ARTHROSCOPY    . MITRAL VALVE REPAIR N/A 05/13/2018   Procedure: Mitral Valve Repair;  Surgeon: HMelrose Nakayama MD;  Location: [facility]  Service: Open Heart Surgery;  Laterality: N/A;  .  NOSE SURGERY    . RIGHT/LEFT HEART CATH AND CORONARY ANGIOGRAPHY N/A 05/05/2018   Procedure: RIGHT/LEFT HEART CATH AND CORONARY ANGIOGRAPHY;  Surgeon: Leonie Man, MD;  Location: [place] [facility];  Service: Cardiovascular;  Laterality: N/A;  . TEE WITHOUT CARDIOVERSION N/A 05/13/2018   Procedure: TRANSESOPHAGEAL ECHOCARDIOGRAM (TEE);  Surgeon: Melrose Nakayama, MD;  Location: [place];  Service: Open Heart Surgery;  Laterality: N/A;  . ULTRASOUND GUIDANCE FOR VASCULAR ACCESS  05/05/2018   Procedure:  Ultrasound Guidance For Vascular Access;  Surgeon: Leonie Man, MD;  Location: [place] [facility];  Service: Cardiovascular;;    Current Medications: Current Meds  Medication Sig  . aspirin EC 81 MG EC tablet Take 1 tablet (81 mg total) by mouth daily.  Marland Kitchen atorvastatin (LIPITOR) 80 MG tablet TAKE 1 TABLET BY MOUTH EVERY DAY AT 6 PM  . Blood Glucose Monitoring Suppl (ONE TOUCH ULTRA 2) w/Device KIT   . carvedilol (COREG) 25 MG tablet TAKE 1 TABLET BY MOUTH TWICE A DAY  . esomeprazole (NEXIUM) 40 MG capsule Take 1 capsule (40 mg total) by mouth at bedtime.  . Lancets (ONETOUCH DELICA PLUS [id number]) [place]   . lisinopril (ZESTRIL) 5 MG tablet Take 1 tablet (5 mg total) by mouth daily.  Glory Rosebush ULTRA test strip   . Simethicone (GAS-X EXTRA STRENGTH PO) Take by mouth. Gel caps uses as needed     Allergies:   Patient has no known allergies.   Social History   Socioeconomic History  . Marital status: Single    Spouse name: Not on file  . Number of children: Not on file  . Years of education: Not on file  . Highest education level: Not on file  Occupational History  . Not on file  Tobacco Use  . Smoking status: Former Research scientist (life sciences)  . Smokeless tobacco: Never Used  Vaping Use  . Vaping Use: Never used  Substance and Sexual Activity  . Alcohol use: Never  . Drug use: Never  . Sexual activity: Not on file  Other Topics Concern  . Not on file  Social History Narrative  . Not on file   Social Determinants of Health   Financial Resource Strain: Not on file  Food Insecurity: Not on file  Transportation Needs: Not on file  Physical Activity: Not on file  Stress: Not on file  Social Connections: Not on file     Family History: The patient's family history includes CAD in his brother; CVA in his father.  ROS:   Please see the history of present illness.    Review of Systems  Constitutional: Negative for chills and fever.  HENT: Negative for hearing loss.   Eyes: Negative  for blurred vision and redness.  Respiratory: Negative for shortness of breath.   Cardiovascular: Negative for chest pain, palpitations, orthopnea, claudication and leg swelling.  Gastrointestinal: Positive for heartburn. Negative for blood in stool and melena.  Genitourinary: Positive for flank pain.  Musculoskeletal: Positive for joint pain and myalgias. Negative for falls.  Neurological: Negative for dizziness and loss of consciousness.  Endo/Heme/Allergies: Negative for polydipsia.  Psychiatric/Behavioral: Negative for substance abuse.    EKGs/Labs/Other Studies Reviewed:    The following studies were reviewed today: TTE 18-Sep-2019: 1. Left ventricular ejection fraction, by estimation, is 25 to 30%. The  left ventricle has severely decreased function. The left ventricle  demonstrates global hypokinesis. There is mild left ventricular  hypertrophy. Left ventricular diastolic parameters  are consistent with Grade  II diastolic dysfunction (pseudonormalization).  Elevated left atrial pressure. Patient refused Definity.  2. Right ventricular systolic function is mildly reduced. The right  ventricular size is mildly enlarged.  3. The mitral valve has been repaired/replaced. There is a 32 mm  prosthetic annuloplasty ring present in the mitral position. Trivial  regurgitation. Mean gradient 4 mmHg.  4. The aortic valve was not well visualized. Aortic valve regurgitation  is mild.  5. Compared to prior TTE on 01/31/19, no significant change in LV systolic  Function  Cath 25/05/39:  ISCHEMIC CARDIOMYOPATHY  Hemodynamic findings consistent with moderate pulmonary hypertension -secondary to pulmonary venous hypertension  LV end diastolic pressure and pulmonary capillary wedge pressure are moderately elevated.  There is severe (4+) mitral regurgitation.  SEVERE MULTIVESSEL DISEASE  Prox RCA to Mid RCA lesion is 90% stenosed. Mid RCA to Dist RCA lesion is 100% stenosed with 100%  stenosed side branch in Post Atrio.  Prox LAD lesion is 99% stenosed. Mid LAD-1 lesion is 80% stenosed. Mid LAD-2 lesion is 90% stenosed. Dist LAD lesion is 85% stenosed.  Prox Cx lesion is 95% stenosed. 2nd Mrg lesion is 90% stenosed with 90% stenosed side branch in Lat 2nd Mrg.   SUMMARY  Severe multivessel CAD: Diffuse proximal RCA disease with mid 100% CTO filling with bridging collaterals and right-to-left collaterals, separate ostium circumflex with proximal 95% stenosis and bifurcation 90% stenosis of OM1, 99% mid LAD after very large septal perforator trunk (perfusing PDA) with bridging collaterals filling mid to distal LAD with a large diagonal branch and diffuse disease throughout the distal LAD.  Moderate pulmonary hypertension likely secondary to pulmonary venous congestion and mitral regurgitation.  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   RECOMMENDATION  Return to nursing unit for TR band removal.  Continue aggressive CHF management  The patient would likely best benefit from CABG plus possible mitral valve repair. --We will consult CVTS  Will place on IV heparin 8 hours after sheath removal  Consider viability study as part of assessment for revascularization options.  Percutaneous options would be limited and extremely high risk given his low output and multivessel disease.  Would likely need support with Impella  Recommend Aspirin 83m daily for severe CAD.  Ultrasound 05/21/20: Abdominal Aorta: There is evidence of abnormal dilatation of the distal  Abdominal aorta. Previous diameter measurement was 4.6 cm obtained on  08/21/19 by CTA.   EKG: 10/09/20: EKG is not ordered today.  Recent  Labs: 10/04/2020: BUN 25; Creatinine, Ser 1.93; Potassium 5.2; Sodium 137   Recent Lipid Panel    Component Value Date/Time   CHOL 122 08/03/2018 0819   TRIG 76 08/03/2018 0819   HDL 46 08/03/2018 0819   CHOLHDL 2.7 08/03/2018 0819   CHOLHDL 4.2 05/04/2018 0433   VLDL 11 05/04/2018 0433   LDLCALC 61 08/03/2018 0819     Risk Assessment/Calculations:       Physical Exam:    VS:  BP 132/70   Pulse 62   Ht '5\' 9"'  (1.753 m)   Wt 189 lb (85.7 kg)   SpO2 98%   BMI 27.91 kg/m     Wt Readings from Last 3 Encounters:  10/09/20 189 lb (85.7 kg)  09/19/20 187 lb 9.6 oz (85.1 kg)  05/21/20 184  lb (83.5 kg)     GEN:  Well nourished, well developed in no acute distress HEENT: Normal NECK: No JVD; No carotid bruits CARDIAC: RRR, no murmurs, rubs, gallops RESPIRATORY:  Clear to auscultation without rales, wheezing or rhonchi  ABDOMEN: Soft, non-tender, non-distended MUSCULOSKELETAL:  No edema; No deformity  SKIN: Warm and dry NEUROLOGIC:  Alert and oriented x 3 PSYCHIATRIC:  Normal affect   ASSESSMENT:    1. Acute kidney injury ([place])   2. Kidney stones   3. Elevated serum creatinine   4. Chronic systolic heart failure ([place])   5. 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   6. Hyperlipidemia, unspecified hyperlipidemia type   7. Ischemic cardiomyopathy   8. AAA (abdominal aortic aneurysm) without rupture ([place])   9. Nonrheumatic mitral valve regurgitation    PLAN:    In order of problems listed above:  #AKI: Cr initial rose to 1.4 after starting spironolactone. Medication stopped and Cr rose further to 1.9. Given recent flank pain and discoloration of his urine, it is possible that the rise in his Cr correlates with passing of kidney stone. He has a significant history of kidney stones in the past and states his symptoms were similar to these episodes. He appears euvolemic today with no LE edema, crackles, or JVD. Weight has remained stable. -Check UA -Check  BNP -Check BMET today  #Ischemic Cardiomyopathy: #Chronic Systolic HF: TTE 14/4315 with LVEF 25-30%.  Appears euvolemic today with no HF symptoms. Cr rising as above but given reassuring exam and higher suspicion for recent renal stones, will not change diuretics at this time. Will check labs today to ensure stable.   -Check BNP and BMET -Continue lisinopril  -Continue coreg -Could not afford entresto; may be able to re-trial in the future -Holding spirinolactone for now given rise in Cr and K -Consider SGLT-2 inhibitor once Cr back to normal -Repeat TTE and if EF <35%, will refer for ICD  #CAD s/p 5V CABG with MVR in 2019: No anginal symptoms. Does not appear grossly overloaded -Continue ASA and lipitor 80m daily -Continue coreg and lisinopril as above -Follow-up repeat TTE  #AAA: Monitored by Dr. CCarlis Abbott Measures 4.89 on most recent ultrasound 05/21/20. -Continue surveillance ultrasounds q617monthper Dr. ClCarlis AbbottThreshold to repair is 5.5cm unless rapid growth or symptoms -Continue aggressive blood pressure control -ASA and statin as above -Follow-up with Dr. ClCarlis Abbotts scheduled  #HTN: Controlled -Continue coreg and lisinopril as above  #HLD: LDL at goal at 50. -Continue lipitor 8059maily  #DMII: Diet controlled. A1C 6.5. -Continue lifestyle modifications -Consider SGLT2 inhibitor at next visit pending renal function  #GERD: -Encouraged him to start the nexium [id number]  Medication Adjustments/Labs and Tests Ordered: Current medicines are reviewed at length with the patient today.  Concerns regarding medicines are outlined above.  Orders Placed This Encounter  Procedures  . Urinalysis   No orders of the defined types were placed in this encounter.   Patient Instructions  Medication Instructions:   Your physician recommends that you continue on your current medications as directed. Please refer to the Current Medication list given to you today.  *If you need  a refill on your cardiac medications before your next appointment, please call your pharmacy*   Lab Work:  TODAY--URINALYSIS  If you have labs (blood work) drawn today and your tests are completely normal, you will receive your results only by: . MyMarland Kitchenhart Message (if you have MyChart) OR . A paper copy in the  mail If you have any lab test that is abnormal or we need to change your treatment, we will call you to review the results.   Follow-Up:  3 MONTHS IN THE OFFICE WITH DR. Johney Frame      Follow-up in 3 months.  I,Mathew Stumpf,acting as a Education administrator for Freada Bergeron, MD.,have documented all relevant documentation on the behalf of Freada Bergeron, MD,as directed by  Freada Bergeron, MD while in the presence of Freada Bergeron, MD.  I, Freada Bergeron, MD, have reviewed all documentation for this visit. The documentation on 10/09/20 for the exam, diagnosis, procedures, and orders are all accurate and complete.  Signed, Freada Bergeron, MD  10/09/2020 10:44 AM    [place]

## 2020-10-10 ENCOUNTER — Telehealth: Payer: Self-pay | Admitting: *Deleted

## 2020-10-10 DIAGNOSIS — R7989 Other specified abnormal findings of blood chemistry: Secondary | ICD-10-CM

## 2020-10-10 DIAGNOSIS — I25810 Atherosclerosis of coronary artery bypass graft(s) without angina pectoris: Secondary | ICD-10-CM

## 2020-10-10 DIAGNOSIS — I5022 Chronic systolic (congestive) heart failure: Secondary | ICD-10-CM

## 2020-10-10 DIAGNOSIS — N179 Acute kidney failure, unspecified: Secondary | ICD-10-CM

## 2020-10-10 DIAGNOSIS — Z79899 Other long term (current) drug therapy: Secondary | ICD-10-CM

## 2020-10-10 LAB — URINALYSIS
Bilirubin, UA: NEGATIVE
Glucose, UA: NEGATIVE
Ketones, UA: NEGATIVE
Nitrite, UA: NEGATIVE
Protein,UA: NEGATIVE
RBC, UA: NEGATIVE
Specific Gravity, UA: 1.009 (ref 1.005–1.030)
Urobilinogen, Ur: 0.2 mg/dL (ref 0.2–1.0)
pH, UA: 5.5 (ref 5.0–7.5)

## 2020-10-10 LAB — BASIC METABOLIC PANEL
BUN/Creatinine Ratio: 12 (ref 10–24)
BUN: 23 mg/dL (ref 8–27)
CO2: 21 mmol/L (ref 20–29)
Calcium: 8.7 mg/dL (ref 8.6–10.2)
Chloride: 102 mmol/L (ref 96–106)
Creatinine, Ser: 1.95 mg/dL — ABNORMAL HIGH (ref 0.76–1.27)
Glucose: 127 mg/dL — ABNORMAL HIGH (ref 65–99)
Potassium: 5.2 mmol/L (ref 3.5–5.2)
Sodium: 136 mmol/L (ref 134–144)
eGFR: 37 mL/min/{1.73_m2} — ABNORMAL LOW (ref >59)

## 2020-10-10 LAB — PRO B NATRIURETIC PEPTIDE: NT-Pro BNP: 1053 pg/mL — ABNORMAL HIGH (ref 0–376)

## 2020-10-10 MED ORDER — FUROSEMIDE 20 MG PO TABS: ORAL_TABLET | ORAL | 0 refills | Status: DC

## 2020-10-10 NOTE — Telephone Encounter (Signed)
Spoke with the pt and endorsed to him, his lab results and recommendations per Dr. Shari Prows, for him to start taking lasix 20 mg po daily for 5 days, then do maintenance lasix 20 mg po twice weekly (take on Mondays and Thursdays) thereafter.  Also informed the pt that per Dr. Shari Prows, she would like him to come back into the office in a week, to have a repeat BMET.  Endorsed to the pt as well that his UA showed some WBC in the urine, but no blood, and Dr. Shari Prows states the we see inflammation after pts pass kidney stones.  Scheduled the pt to come back in for lab on next Thursday 4/28, to recheck BMET.  Phoned in the pts lasix order to his pharmacy [facility], and endorsed to the pharmacist there.  She read back verbal orders correctly and will fill this now for the pt.  Pt made aware of script phoned in and will be available to pick up here shortly. Pt verbalized understanding and agrees with this plan.

## 2020-10-10 NOTE — Addendum Note (Signed)
Addended by: Loa Socks on: 10/10/2020 11:52 AM   Modules accepted: Orders

## 2020-10-10 NOTE — Telephone Encounter (Signed)
-----   Message from Meriam Sprague, MD sent at 10/10/2020  8:38 AM EDT ----- His fluid levels are up which is why his kidney function has remained high. To help remove the fluid, let's do lasix 20mg  daily for 5 days and then continue a maintenance diuretic of lasix 20mg  PO twice weekly (Monday, Thursday). Repeat BMET next week to ensure things are looking better.   His UA shows some WBC in the urine but no blood. Sometimes we see inflammation after he passes a stone.

## 2020-10-17 ENCOUNTER — Other Ambulatory Visit: Payer: Self-pay

## 2020-10-17 ENCOUNTER — Other Ambulatory Visit: Payer: Medicare HMO

## 2020-10-17 DIAGNOSIS — I25810 Atherosclerosis of coronary artery bypass graft(s) without angina pectoris: Secondary | ICD-10-CM | POA: Diagnosis not present

## 2020-10-17 DIAGNOSIS — N179 Acute kidney failure, unspecified: Secondary | ICD-10-CM | POA: Diagnosis not present

## 2020-10-17 DIAGNOSIS — Z79899 Other long term (current) drug therapy: Secondary | ICD-10-CM | POA: Diagnosis not present

## 2020-10-17 DIAGNOSIS — R7989 Other specified abnormal findings of blood chemistry: Secondary | ICD-10-CM | POA: Diagnosis not present

## 2020-10-17 DIAGNOSIS — I5022 Chronic systolic (congestive) heart failure: Secondary | ICD-10-CM

## 2020-10-17 LAB — BASIC METABOLIC PANEL
BUN/Creatinine Ratio: 14 (ref 10–24)
BUN: 32 mg/dL — ABNORMAL HIGH (ref 8–27)
CO2: 23 mmol/L (ref 20–29)
Calcium: 8.8 mg/dL (ref 8.6–10.2)
Chloride: 103 mmol/L (ref 96–106)
Creatinine, Ser: 2.25 mg/dL — ABNORMAL HIGH (ref 0.76–1.27)
Glucose: 126 mg/dL — ABNORMAL HIGH (ref 65–99)
Potassium: 5 mmol/L (ref 3.5–5.2)
Sodium: 138 mmol/L (ref 134–144)
eGFR: 31 mL/min/{1.73_m2} — ABNORMAL LOW (ref >59)

## 2020-10-18 ENCOUNTER — Telehealth: Payer: Self-pay

## 2020-10-18 DIAGNOSIS — R7989 Other specified abnormal findings of blood chemistry: Secondary | ICD-10-CM

## 2020-10-18 NOTE — Telephone Encounter (Signed)
The patient has been notified of the result and verbalized understanding.  All questions (if any) were answered. Theresia Majors, RN 10/18/2020 8:58 AM  He will stop Lasix and lisinopril. Referral has been placed for nephrology

## 2020-10-18 NOTE — Telephone Encounter (Signed)
-----   Message from Meriam Sprague, MD sent at 10/18/2020  3:09 AM EDT ----- His renal function is continuing to trend in the wrong direction. Let's stop the lasix and hold the lisinopril. Can we refer to renal? This is not adding up.

## 2020-10-23 ENCOUNTER — Ambulatory Visit (HOSPITAL_COMMUNITY): Payer: Medicare HMO

## 2020-10-25 ENCOUNTER — Other Ambulatory Visit: Payer: Self-pay

## 2020-10-25 DIAGNOSIS — I714 Abdominal aortic aneurysm, without rupture, unspecified: Secondary | ICD-10-CM

## 2020-11-19 ENCOUNTER — Encounter: Payer: Self-pay | Admitting: Vascular Surgery

## 2020-11-19 ENCOUNTER — Ambulatory Visit (INDEPENDENT_AMBULATORY_CARE_PROVIDER_SITE_OTHER): Payer: Medicare HMO | Admitting: Vascular Surgery

## 2020-11-19 ENCOUNTER — Other Ambulatory Visit: Payer: Self-pay

## 2020-11-19 ENCOUNTER — Ambulatory Visit (HOSPITAL_COMMUNITY)
Admission: RE | Admit: 2020-11-19 | Discharge: 2020-11-19 | Disposition: A | Discharge status: Home / Self Care | Payer: Medicare HMO | Source: Ambulatory Visit | Attending: Vascular Surgery | Admitting: Vascular Surgery

## 2020-11-19 VITALS — BP 139/71 | HR 60 | Temp 97.9°F | Ht 70.0 in | Wt 186.6 lb

## 2020-11-19 DIAGNOSIS — I714 Abdominal aortic aneurysm, without rupture, unspecified: Secondary | ICD-10-CM

## 2020-11-19 NOTE — Progress Notes (Signed)
Patient name: Michael Banks MRN: [id number] DOB: 21-Jul-1950 Sex: male  REASON FOR CONSULT: 2 month follow-up for AAA HPI: Michael Banks is a 70 y.o. male, with history of coronary artery disease status post CABG and mitral valve repair, hypertension, hyperlipidemia, tobacco abuse, CHF with a EF of 25 to 30% presents for 6 month follow-up of known abdominal aortic aneurysm.  This was initially discovered by his urologist.  He had a stone study 02/27/2019 with noted 4.6 cm infrarenal abdominal aortic aneurysm.  His aneurysm last measured 4.9 cm approximately 6 months ago.  On follow-up today states he still having significant issues with reflux.  Denies any abdominal or back pain.  Past Medical History:  Diagnosis Date  . Arthritis   . Diabetes ([place])   . Dilated cardiomyopathy ([place])   . Gallstones   . Hydronephrosis   . Hyperglycemia   . Hypertension   . Kidney stone   . UTI (urinary tract infection)     Past Surgical History:  Procedure Laterality Date  . CORONARY ARTERY BYPASS GRAFT N/A 05/13/2018   Procedure: CORONARY ARTERY BYPASS GRAFTING (CABG) times _five__ using left internal mammary artery and right  greater saphenous vein harvested endoscopically.;  Surgeon: Melrose Nakayama, MD;  Location: [place];  Service: Open Heart Surgery;  Laterality: N/A;  . KNEE ARTHROSCOPY    . MITRAL VALVE REPAIR N/A 05/13/2018   Procedure: Mitral Valve Repair;  Surgeon: Melrose Nakayama, MD;  Location: [place];  Service: Open Heart Surgery;  Laterality: N/A;  . NOSE SURGERY    . RIGHT/LEFT HEART CATH AND CORONARY ANGIOGRAPHY N/A 05/05/2018   Procedure: RIGHT/LEFT HEART CATH AND CORONARY ANGIOGRAPHY;  Surgeon: Leonie Man, MD;  Location: [place] [facility];  Service: Cardiovascular;  Laterality: N/A;  . TEE WITHOUT CARDIOVERSION N/A 05/13/2018   Procedure: TRANSESOPHAGEAL ECHOCARDIOGRAM (TEE);  Surgeon: Melrose Nakayama, MD;  Location: [place];  Service: Open Heart Surgery;   Laterality: N/A;  . ULTRASOUND GUIDANCE FOR VASCULAR ACCESS  05/05/2018   Procedure: Ultrasound Guidance For Vascular Access;  Surgeon: Leonie Man, MD;  Location: [place] [facility];  Service: Cardiovascular;;    Family History  Problem Relation Age of Onset  . CVA Father        died of "cerebral hemorrhage"  . CAD Brother        died of "heart attack" at age 110    SOCIAL HISTORY: Social History   Socioeconomic History  . Marital status: Single    Spouse name: Not on file  . Number of children: Not on file  . Years of education: Not on file  . Highest education level: Not on file  Occupational History  . Not on file  Tobacco Use  . Smoking status: Former Smoker    Quit date: 2018    Years since quitting: 4.4  . Smokeless tobacco: Never Used  Vaping Use  . Vaping Use: Never used  Substance and Sexual Activity  . Alcohol use: Never  . Drug use: Never  . Sexual activity: Not on file  Other Topics Concern  . Not on file  Social History Narrative  . Not on file   Social Determinants of Health   Financial Resource Strain: Not on file  Food Insecurity: Not on file  Transportation Needs: Not on file  Physical Activity: Not on file  Stress: Not on file  Social Connections: Not on file  Intimate Partner Violence: Not on file  No Known Allergies  Current Outpatient Medications  Medication Sig Dispense Refill  . aspirin EC 81 MG EC tablet Take 1 tablet (81 mg total) by mouth daily.    Marland Kitchen atorvastatin (LIPITOR) 80 MG tablet TAKE 1 TABLET BY MOUTH EVERY DAY AT 6 PM 90 tablet 2  . carvedilol (COREG) 25 MG tablet TAKE 1 TABLET BY MOUTH TWICE A DAY 180 tablet 1  . Simethicone (GAS-X EXTRA STRENGTH PO) Take by mouth. Gel caps uses as needed    . Blood Glucose Monitoring Suppl (ONE TOUCH ULTRA 2) w/Device KIT  (Patient not taking: Reported on 11/19/2020)    . esomeprazole (NEXIUM) 40 MG capsule Take 1 capsule (40 mg total) by mouth at bedtime. (Patient not taking:  Reported on 11/19/2020) 90 capsule 1  . Lancets (ONETOUCH DELICA PLUS [id number]) [place]  (Patient not taking: Reported on 11/19/2020)    . ONETOUCH ULTRA test strip  (Patient not taking: Reported on 11/19/2020)     No current facility-administered medications for this visit.    REVIEW OF SYSTEMS:  '[X]'  denotes positive finding, '[ ]'  denotes negative finding Cardiac  Comments:  Chest pain or chest pressure:    Shortness of breath upon exertion:    Short of breath when lying flat:    Irregular heart rhythm:        Vascular    Pain in calf, thigh, or hip brought on by ambulation:    Pain in feet at night that wakes you up from your sleep:     Blood clot in your veins:    Leg swelling:         Pulmonary    Oxygen at home:    Productive cough:     Wheezing:         Neurologic    Sudden weakness in arms or legs:     Sudden numbness in arms or legs:     Sudden onset of difficulty speaking or slurred speech:    Temporary loss of vision in one eye:     Problems with dizziness:         Gastrointestinal    Blood in stool:     Vomited blood:         Genitourinary    Burning when urinating:     Blood in urine:        Psychiatric    Major depression:         Hematologic    Bleeding problems:    Problems with blood clotting too easily:        Skin    Rashes or ulcers:        Constitutional    Fever or chills:      PHYSICAL EXAM: Vitals:   11/19/20 0922  BP: 139/71  Pulse: 60  Temp: 97.9 F (36.6 C)  TempSrc: Skin  SpO2: 98%  Weight: 186 lb 9.6 oz (84.6 kg)  Height: '5\' 10"'  (1.778 m)    GENERAL: The patient is a well-nourished male, in no acute distress. The vital signs are documented above. CARDIAC: There is a regular rate and rhythm.  VASCULAR:  Palpable femoral pulses bilaterally Palpable dorsalis pedis pulses  PULMONARY: No respiratory distress. ABDOMEN: Soft and non-tender.  No pain with palpation of aneurysm. MUSCULOSKELETAL: There are no major deformities or  cyanosis. NEUROLOGIC: No focal weakness or paresthesias are detected. SKIN: There are no ulcers or rashes noted. PSYCHIATRIC: The patient has a normal affect.  DATA:   Repeat AAA duplex  today shows increased size of his abdominal aortic aneurysm from 4.9 cm to 5.1 cm over the last 6 months  Assessment/Plan:  70 year old male presents for 6 month follow-up of known 4.9 cm infrarenal abdominal aortic aneurysm.  I discussed with him that his aneurysm has increased slightly from 4.9 cm to 5.1 cm over the last 6 months.  He is having no symptoms of his aneurysm and he had no pain today with palpation of the aneurysm today.  Discussed current guidelines are repair greater than 5.5 cm in men.  We will arrange follow-up again in 6 months with me with AAA duplex.  I discussed concerning signs and symptoms with him.   Marty Heck, MD [facility] Office: [phone]

## 2020-11-20 ENCOUNTER — Telehealth: Payer: Self-pay | Admitting: Cardiology

## 2020-11-20 NOTE — Telephone Encounter (Signed)
Spoke with pt and made him aware of why he was referred to [facility].  Pt will call them back and make the appt.  Pt appreciative for call.

## 2020-11-20 NOTE — Telephone Encounter (Signed)
Patient wanted to discuss the reason why Dr. Shari Prows referred him to [facility].

## 2020-11-21 ENCOUNTER — Other Ambulatory Visit: Payer: Self-pay

## 2020-11-21 MED ORDER — ATORVASTATIN CALCIUM 80 MG PO TABS: ORAL_TABLET | ORAL | 2 refills | Status: DC

## 2020-11-22 ENCOUNTER — Other Ambulatory Visit: Payer: Self-pay

## 2020-11-22 ENCOUNTER — Ambulatory Visit (HOSPITAL_COMMUNITY): Payer: Medicare HMO | Attending: Cardiology

## 2020-11-22 DIAGNOSIS — I714 Abdominal aortic aneurysm, without rupture, unspecified: Secondary | ICD-10-CM

## 2020-11-22 DIAGNOSIS — I5022 Chronic systolic (congestive) heart failure: Secondary | ICD-10-CM | POA: Diagnosis not present

## 2020-11-22 DIAGNOSIS — I25810 Atherosclerosis of coronary artery bypass graft(s) without angina pectoris: Secondary | ICD-10-CM | POA: Diagnosis not present

## 2020-11-22 LAB — ECHOCARDIOGRAM COMPLETE
Area-P 1/2: 3.66 cm2
MV VTI: 1.43 cm2
S' Lateral: 3.7 cm

## 2020-11-22 NOTE — Progress Notes (Signed)
Patient ID: Michael Banks, male   DOB: [birth year], 70 y.o.   MRN: [id number]   Patient would not consent to the administration of Definity.

## 2020-11-26 ENCOUNTER — Telehealth: Payer: Self-pay | Admitting: *Deleted

## 2020-11-26 DIAGNOSIS — I340 Nonrheumatic mitral (valve) insufficiency: Secondary | ICD-10-CM

## 2020-11-26 DIAGNOSIS — I25810 Atherosclerosis of coronary artery bypass graft(s) without angina pectoris: Secondary | ICD-10-CM

## 2020-11-26 DIAGNOSIS — R9430 Abnormal result of cardiovascular function study, unspecified: Secondary | ICD-10-CM

## 2020-11-26 DIAGNOSIS — Z951 Presence of aortocoronary bypass graft: Secondary | ICD-10-CM

## 2020-11-26 DIAGNOSIS — Z9889 Other specified postprocedural states: Secondary | ICD-10-CM

## 2020-11-26 DIAGNOSIS — R931 Abnormal findings on diagnostic imaging of heart and coronary circulation: Secondary | ICD-10-CM

## 2020-11-26 DIAGNOSIS — I255 Ischemic cardiomyopathy: Secondary | ICD-10-CM

## 2020-11-26 NOTE — Telephone Encounter (Signed)
Pt aware of echo results and recommendations per Dr. Shari Prows. Referral to EP placed and pt is aware our EP [facility], will call him back to arrange this appt.  Pt did confirm with me that he has his new pt appt with [facility] for this Friday 11/29/20.   Pt voiced no cardiac complaints at this time.  Pt verbalized understanding and agrees with this plan. Will send this message to Dr. Shari Prows as a general FYI.

## 2020-11-26 NOTE — Telephone Encounter (Signed)
-----   Message from Meriam Sprague, MD sent at 11/22/2020  3:36 PM EDT ----- His echo shows his LVEF is persistently low at 30-35%. His mitral valve repair looks good and stable from prior study. Given that his LVEF remains <35%, we will have him see EP to discuss possible ICD given risk of unstable arrhythmias in patients with low pumping function. Can we make that referral please?  Also, how is he feeling? Did he get to see a kidney doctor?

## 2020-11-28 NOTE — Telephone Encounter (Signed)
Next Appt With Cardiology Sherryl Manges, MD)12/20/2020 at  9:00 AM

## 2020-11-29 DIAGNOSIS — I129 Hypertensive chronic kidney disease with stage 1 through stage 4 chronic kidney disease, or unspecified chronic kidney disease: Secondary | ICD-10-CM | POA: Diagnosis not present

## 2020-11-29 DIAGNOSIS — N390 Urinary tract infection, site not specified: Secondary | ICD-10-CM | POA: Diagnosis not present

## 2020-11-29 DIAGNOSIS — N179 Acute kidney failure, unspecified: Secondary | ICD-10-CM | POA: Diagnosis not present

## 2020-11-29 DIAGNOSIS — I255 Ischemic cardiomyopathy: Secondary | ICD-10-CM | POA: Diagnosis not present

## 2020-11-29 DIAGNOSIS — Z87442 Personal history of urinary calculi: Secondary | ICD-10-CM | POA: Diagnosis not present

## 2020-11-29 DIAGNOSIS — E785 Hyperlipidemia, unspecified: Secondary | ICD-10-CM | POA: Diagnosis not present

## 2020-11-29 DIAGNOSIS — N183 Chronic kidney disease, stage 3 unspecified: Secondary | ICD-10-CM | POA: Diagnosis not present

## 2020-12-03 ENCOUNTER — Other Ambulatory Visit: Payer: Self-pay | Admitting: Nephrology

## 2020-12-03 DIAGNOSIS — N183 Chronic kidney disease, stage 3 unspecified: Secondary | ICD-10-CM

## 2020-12-06 ENCOUNTER — Encounter: Payer: Self-pay | Admitting: Internal Medicine

## 2020-12-06 ENCOUNTER — Ambulatory Visit (INDEPENDENT_AMBULATORY_CARE_PROVIDER_SITE_OTHER): Payer: Medicare HMO | Admitting: Internal Medicine

## 2020-12-06 VITALS — BP 130/82 | HR 60 | Ht 70.0 in | Wt 187.0 lb

## 2020-12-06 DIAGNOSIS — R002 Palpitations: Secondary | ICD-10-CM | POA: Diagnosis not present

## 2020-12-06 DIAGNOSIS — I5022 Chronic systolic (congestive) heart failure: Secondary | ICD-10-CM | POA: Diagnosis not present

## 2020-12-06 DIAGNOSIS — I255 Ischemic cardiomyopathy: Secondary | ICD-10-CM | POA: Diagnosis not present

## 2020-12-06 NOTE — Progress Notes (Signed)
ELECTROPHYSIOLOGY CONSULT NOTE  Patient ID: Michael Banks, MRN: [id number], DOB/AGE: Nov 29, 1950 70 y.o. Admit date: (Not on file) Date of Consult: 12/06/2020  Primary Physician: Freada Bergeron, MD Primary Cardiologist:       Michael Banks is a 70 y.o. male who is being seen today for the evaluation of ICD at the request of Dr. Ward Chatters    HPI Michael Banks is a 70 y.o. male referred for consideration of an ICD.  He has a history of ischemic cardiomyopathy with bypass surgery and mitral valve   in 2019.  Some postoperative atrial fibrillation.     The patient denies chest pain,nocturnal dyspnea, orthopnea or peripheral edema. There have been,  no lightheadedness or syncope.  Complains of  mild fatigue , exertional shortness of breath and palpitations    He has experienced some Fatigue  Dyspnea assoc with mild physical exertion, provoked by walking up 1 flight of stairs,  Can walk a few blocks  He notes he have experienced irregular arrhythmia and palpitations when he is lying down on his left side of body      DATE TEST EF   11/19 Echo  20-25 %   11/19 TEE  20-25 %   2/20 Echo 25-30%   820 Echo Complete 25-30%   3/21 Echo Limited 25-30%   06/22 Echo Complete 30-35%    Date Cr K Hgb  09/20 1.00 3.9 12.2  02/21 1.03 4.5 (09/20) 12.2  04/22 2.25 5.0       Past Medical History:  Diagnosis Date   Arthritis    Diabetes ([place])    Dilated cardiomyopathy ([place])    Gallstones    Hydronephrosis    Hyperglycemia    Hypertension    Kidney stone    UTI (urinary tract infection)       Surgical History:  Past Surgical History:  Procedure Laterality Date   CORONARY ARTERY BYPASS GRAFT N/A 05/13/2018   Procedure: CORONARY ARTERY BYPASS GRAFTING (CABG) times _five__ using left internal mammary artery and right  greater saphenous vein harvested endoscopically.;  Surgeon: Melrose Nakayama, MD;  Location: [place];  Service: Open Heart Surgery;  Laterality: N/A;    KNEE ARTHROSCOPY     MITRAL VALVE REPAIR N/A 05/13/2018   Procedure: Mitral Valve Repair;  Surgeon: Melrose Nakayama, MD;  Location: [place];  Service: Open Heart Surgery;  Laterality: N/A;   NOSE SURGERY     RIGHT/LEFT HEART CATH AND CORONARY ANGIOGRAPHY N/A 05/05/2018   Procedure: RIGHT/LEFT HEART CATH AND CORONARY ANGIOGRAPHY;  Surgeon: Leonie Man, MD;  Location: [place] [facility];  Service: Cardiovascular;  Laterality: N/A;   TEE WITHOUT CARDIOVERSION N/A 05/13/2018   Procedure: TRANSESOPHAGEAL ECHOCARDIOGRAM (TEE);  Surgeon: Melrose Nakayama, MD;  Location: [place];  Service: Open Heart Surgery;  Laterality: N/A;   ULTRASOUND GUIDANCE FOR VASCULAR ACCESS  05/05/2018   Procedure: Ultrasound Guidance For Vascular Access;  Surgeon: Leonie Man, MD;  Location: [place] [facility];  Service: Cardiovascular;;     Home Meds: Current Meds  Medication Sig   aspirin EC 81 MG EC tablet Take 1 tablet (81 mg total) by mouth daily.   atorvastatin (LIPITOR) 80 MG tablet TAKE 1 TABLET BY MOUTH EVERY DAY AT 6 PM   carvedilol (COREG) 25 MG tablet TAKE 1 TABLET BY MOUTH TWICE A DAY   esomeprazole (NEXIUM) 40 MG capsule Take 1 capsule (40 mg total) by mouth at bedtime.  Simethicone (GAS-X EXTRA STRENGTH PO) Take by mouth. Gel caps uses as needed   [DISCONTINUED] Blood Glucose Monitoring Suppl (ONE TOUCH ULTRA 2) w/Device KIT    [DISCONTINUED] Lancets (ONETOUCH DELICA PLUS [id number]) MISC    [DISCONTINUED] ONETOUCH ULTRA test strip     Allergies: No Known Allergies  Social History   Socioeconomic History   Marital status: Single    Spouse name: Not on file   Number of children: Not on file   Years of education: Not on file   Highest education level: Not on file  Occupational History   Not on file  Tobacco Use   Smoking status: Former    Pack years: 0.00    Types: Cigarettes    Quit date: 2018    Years since quitting: 4.4   Smokeless tobacco: Never  Vaping Use   Vaping  Use: Never used  Substance and Sexual Activity   Alcohol use: Never   Drug use: Never   Sexual activity: Not on file  Other Topics Concern   Not on file  Social History Narrative   Not on file   Social Determinants of Health   Financial Resource Strain: Not on file  Food Insecurity: Not on file  Transportation Needs: Not on file  Physical Activity: Not on file  Stress: Not on file  Social Connections: Not on file  Intimate Partner Violence: Not on file     Family History  Problem Relation Age of Onset   CVA Father        died of "cerebral hemorrhage"   CAD Brother        died of "heart attack" at age 59     Physical Exam: BP (!) 160/82   Pulse 60   Ht 5' 10" (1.778 m)   Wt 187 lb (84.8 kg)   SpO2 98%   BMI 26.83 kg/m  Well developed and Morbidly obese in no acute distress HENT normal Neck supple with JVP-6 Lungs Clear Regular rate and Irregular rhythm, no  gallop No  murmur Abd-soft with active BS No Clubbing cyanosis No LE edema Skin-warm and dry A & Oriented  Grossly normal sensory and motor function  EKG: sinus @ 60 19/12/46   Labs: Cardiac Enzymes No results for input(s): CKTOTAL, CKMB, TROPONINI in the last 72 hours. CBC Lab Results  Component Value Date   WBC 5.6 03/01/2019   HGB 12.2 (L) 03/01/2019   HCT 36.7 (L) 03/01/2019   MCV 91.5 03/01/2019   PLT 165 03/01/2019   PROTIME: No results for input(s): LABPROT, INR in the last 72 hours. Chemistry No results for input(s): NA, K, CL, CO2, BUN, CREATININE, CALCIUM, PROT, BILITOT, ALKPHOS, ALT, AST, GLUCOSE in the last 168 hours.  Invalid input(s): LABALBU Lipids Lab Results  Component Value Date   CHOL 122 08/03/2018   HDL 46 08/03/2018   LDLCALC 61 08/03/2018   TRIG 76 08/03/2018   BNP NT-Pro BNP  Date/Time Value Ref Range Status  10/09/2020 10:35 AM 1,053 (H) 0 - 376 pg/mL Final    Comment:    The following cut-points have been suggested for the use of proBNP for the diagnostic  evaluation of heart failure (HF) in patients with acute dyspnea: Modality                     Age           Optimal Cut                            (  years)            Point ------------------------------------------------------ Diagnosis (rule in HF)        <50            450 pg/mL                           50 - 75            900 pg/mL                               >75           1800 pg/mL Exclusion (rule out HF)  Age independent     300 pg/mL    Thyroid Function Tests: No results for input(s): TSH, T4TOTAL, T3FREE, THYROIDAB in the last 72 hours.  Invalid input(s): FREET3 Miscellaneous No results found for: DDIMER  Radiology/Studies:  AAA Duplex  Result Date: 11/19/2020 ABDOMINAL AORTA STUDY Patient Name:  Michael Banks  Date of Exam:   11/19/2020 Medical Rec #: [id number]        Accession #:    [id number] Date of Birth: [birth year]       Patient Gender: M Patient Age:   69Y Exam Location:  [facility] Procedure:      VAS [place] AAA DUPLEX Referring Phys: [id number] Marty Heck --------------------------------------------------------------------------------  Indications: Follow up exam for known AAA. Risk Factors: Hypertension, Diabetes, past history of smoking, coronary artery               disease.  Performing Technologist: Delorise Shiner RVT  Examination Guidelines: A complete evaluation includes B-mode imaging, spectral Doppler, color Doppler, and power Doppler as needed of all accessible portions of each vessel. Bilateral testing is considered an integral part of a complete examination. Limited examinations for reoccurring indications may be performed as noted.  Abdominal Aorta Findings: +-----------+-------+----------+----------+--------+--------+--------+ Location   AP (cm)Trans (cm)PSV (cm/s)WaveformThrombusComments +-----------+-------+----------+----------+--------+--------+--------+ Proximal   2.50   2.54      119                                 +-----------+-------+----------+----------+--------+--------+--------+ Mid        4.94   5.06      120                                +-----------+-------+----------+----------+--------+--------+--------+ Distal     2.84   2.72      85                                 +-----------+-------+----------+----------+--------+--------+--------+ RT CIA Prox1.1    1.4       156                                +-----------+-------+----------+----------+--------+--------+--------+ LT CIA Prox1.3    1.6       129                                +-----------+-------+----------+----------+--------+--------+--------+  Summary: Abdominal Aorta: The largest aortic measurement is 5.1 cm. The largest aortic diameter remains  essentially unchanged compared to prior exam. Previous diameter measurement was 4.9 cm obtained on 05/21/2020.  *See table(s) above for measurements and observations.  Electronically signed by Monica Martinez MD on 11/19/2020 at 9:51:33 AM.    Final    ECHOCARDIOGRAM COMPLETE  Result Date: 11/22/2020    ECHOCARDIOGRAM REPORT   Patient Name:   Michael Banks Date of Exam: 11/22/2020 Medical Rec #:  [id number]       Height:       70.0 in Accession #:    [id number]      Weight:       186.6 lb Date of Birth:  1951/05/30      BSA:          2.027 m Patient Age:    42 years        BP:           139/71 mmHg Patient Gender: M               HR:           57 bpm. Exam Location:  [place] Procedure: 2D Echo, Cardiac Doppler and Color Doppler Indications:    R15.40 Chronic systolic (congestive) heart failure; I25.810 CAD                 of autologous artery bypass graft without angina  History:        Patient has prior history of Echocardiogram examinations, most                 recent 08/21/2019. Prior CABG; Risk Factors:Dyslipidemia. MVR.                 Ischemic cardiomyopathy. AAA. Hyperglycemia. Pulmonary edema.  Sonographer:    Diamond Nickel RCS Referring Phys: [id number] Freada Bergeron  Sonographer Comments: Patient would not consent to the administration of Definity. IMPRESSIONS  1. Left ventricular ejection fraction, by estimation, is 30 to 35%. The left ventricle has moderately decreased function. The left ventricle has no regional wall motion abnormalities. There is mild left ventricular hypertrophy of the basal-septal segment. Left ventricular diastolic parameters are indeterminate. Elevated left ventricular end-diastolic pressure.  2. Right ventricular systolic function is normal. The right ventricular size is normal. Tricuspid regurgitation signal is inadequate for assessing PA pressure.  3. The mitral valve has been repaired/replaced. Trivial mitral valve regurgitation. Mild mitral stenosis. The mean mitral valve gradient is 5.0 mmHg.  4. The aortic valve is normal in structure. Aortic valve regurgitation is mild. No aortic stenosis is present. FINDINGS  Left Ventricle: Left ventricular ejection fraction, by estimation, is 30 to 35%. The left ventricle has moderately decreased function. The left ventricle has no regional wall motion abnormalities. The left ventricular internal cavity size was normal in size. There is mild left ventricular hypertrophy of the basal-septal segment. Left ventricular diastolic parameters are indeterminate. Elevated left ventricular end-diastolic pressure. Right Ventricle: The right ventricular size is normal. No increase in right ventricular wall thickness. Right ventricular systolic function is normal. Tricuspid regurgitation signal is inadequate for assessing PA pressure. Left Atrium: Left atrial size was normal in size. Right Atrium: Right atrial size was normal in size. Pericardium: There is no evidence of pericardial effusion. Mitral Valve: The mitral valve has been repaired/replaced. There is mild calcification of the mitral valve leaflet(s). Trivial mitral valve regurgitation. There is a 32 mm prosthetic annuloplasty ring present in the mitral  position. Mild mitral valve stenosis. MV peak gradient, 17.5 mmHg. The mean  mitral valve gradient is 5.0 mmHg. Tricuspid Valve: The tricuspid valve is normal in structure. Tricuspid valve regurgitation is trivial. No evidence of tricuspid stenosis. Aortic Valve: The aortic valve is normal in structure. Aortic valve regurgitation is mild. No aortic stenosis is present. Pulmonic Valve: The pulmonic valve was normal in structure. Pulmonic valve regurgitation is not visualized. No evidence of pulmonic stenosis. Aorta: The aortic root is normal in size and structure. Venous: The inferior vena cava was not well visualized. IAS/Shunts: No atrial level shunt detected by color flow Doppler.  LEFT VENTRICLE PLAX 2D LVIDd:         4.70 cm  Diastology LVIDs:         3.70 cm  LV e' medial:    5.23 cm/s LV PW:         1.00 cm  LV E/e' medial:  30.0 LV IVS:        1.30 cm  LV e' lateral:   4.84 cm/s LVOT diam:     2.05 cm  LV E/e' lateral: 32.4 LV SV:         76 LV SV Index:   38 LVOT Area:     3.30 cm  RIGHT VENTRICLE RV S prime:     6.92 cm/s TAPSE (M-mode): 1.0 cm LEFT ATRIUM             Index       RIGHT ATRIUM           Index LA diam:        4.70 cm 2.32 cm/m  RA Area:     13.50 cm LA Vol (A2C):   45.3 ml 22.35 ml/m RA Volume:   29.60 ml  14.61 ml/m LA Vol (A4C):   45.8 ml 22.60 ml/m LA Biplane Vol: 48.3 ml 23.83 ml/m  AORTIC VALVE LVOT Vmax:   77.70 cm/s LVOT Vmean:  49.500 cm/s LVOT VTI:    0.231 m  AORTA Ao Root diam: 3.60 cm MITRAL VALVE MV Area (PHT): 3.66 cm     SHUNTS MV Area VTI:   1.43 cm     Systemic VTI:  0.23 m MV Peak grad:  17.5 mmHg    Systemic Diam: 2.05 cm MV Mean grad:  5.0 mmHg MV Vmax:       2.09 m/s MV Vmean:      95.2 cm/s MV Decel Time: 207 msec MV E velocity: 157.00 cm/s MV A velocity: 107.00 cm/s MV E/A ratio:  1.47 Fransico Him MD Electronically signed by Fransico Him MD Signature Date/Time: 11/22/2020/2:52:01 PM    Final      Assessment and Plan:  Is ischemic cardiomyopathy prior bypass  surgery/MVR  Acute renal injury-improving  Congestive heart failure-class II  PVCs   The patient has persistent left ventricular dysfunction in the setting of prior bypass and mitral valve repair.  Unfortunately, he has understanding of his medical regime has prompted him to discontinue tolerated guideline directed therapy including Entresto and Aldactone.  There was a recent issue with a kidney stone resulting with elevated creatinine; his enzymes back to normal but we do not have these data.  Prior to proceeding with recommendation for an ICD I think will be important to resume guideline directed therapy and then reassess left ventricular function.  He voices understanding.  Is agreeable.  I will reach out to Dr. Johney Frame, and we will plan to resume Entresto beginning at 24/26.  Reassessment thereafter with initiation of spironolactone at 12.5.  He is also  a candidate for an SGLT2 but will start that as a third agent in terms of more modest benefits.  Moreover, would undertake Zio patch to quantitate PVCs to make sure that the burden is not potentially contributing to his cardiomyopathy     I,Stephanie Williams,acting as a scribe for Virl Axe, MD.,have documented all relevant documentation on the behalf of Virl Axe, MD,as directed by  Virl Axe, MD while in the presence of Virl Axe, MD.  I, Virl Axe, MD, have reviewed all documentation for this visit. The documentation on 12/06/20 for the exam, diagnosis, procedures, and orders are all accurate and complete.

## 2020-12-06 NOTE — Patient Instructions (Addendum)
Medication Instructions:  Your physician recommends that you continue on your current medications as directed. Please refer to the Current Medication list given to you today.  *If you need a refill on your cardiac medications before your next appointment, please call your pharmacy*   Lab Work: None ordered.  If you have labs (blood work) drawn today and your tests are completely normal, you will receive your results only by: [place] (if you have MyChart) OR A paper copy in the mail If you have any lab test that is abnormal or we need to change your treatment, we will call you to review the results.   Testing/Procedures: [place]- Long Term Monitor Instructions  Your physician has requested you wear a ZIO patch monitor for 7 days.  This is a single patch monitor. [employer] supplies one patch monitor per enrollment. Additional stickers are not available. Please do not apply patch if you will be having a Nuclear Stress Test,  Echocardiogram, Cardiac CT, MRI, or Chest Xray during the period you would be wearing the  monitor. The patch cannot be worn during these tests. You cannot remove and re-apply the  ZIO XT patch monitor.  Your ZIO patch monitor will be mailed 3 day USPS to your address on file. It may take 3-5 days  to receive your monitor after you have been enrolled.  Once you have received your monitor, please review the enclosed instructions. Your monitor  has already been registered assigning a specific monitor serial # to you.  Billing and Patient Assistance Program Information  We have supplied [employer] with any of your insurance information on file for billing purposes. [employer] offers a sliding scale Patient Assistance Program for patients that do not have  insurance, or whose insurance does not completely cover the cost of the ZIO monitor.  You must apply for the Patient Assistance Program to qualify for this discounted rate.  To apply, please call [employer] at  [phone], select option 4, select option 2, ask to apply for  Patient Assistance Program. Theodore Demark will ask your household income, and how many people  are in your household. They will quote your out-of-pocket cost based on that information.  [employer] will also be able to set up a 56-month interest-free payment plan if needed.  Applying the monitor   Shave hair from upper left chest.  Hold abrader disc by orange tab. Rub abrader in 40 strokes over the upper left chest as  indicated in your monitor instructions.  Clean area with 4 enclosed alcohol pads. Let dry.  Apply patch as indicated in monitor instructions. Patch will be placed under collarbone on left  side of chest with arrow pointing upward.  Rub patch adhesive wings for 2 minutes. Remove white label marked "1". Remove the white  label marked "2". Rub patch adhesive wings for 2 additional minutes.  While looking in a mirror, press and release button in center of patch. A small green light will  flash 3-4 times. This will be your only indicator that the monitor has been turned on.  Do not shower for the first 24 hours. You may shower after the first 24 hours.  Press the button if you feel a symptom. You will hear a small click. Record Date, Time and  Symptom in the Patient Logbook.  When you are ready to remove the patch, follow instructions on the last 2 pages of Patient  Logbook. Stick patch monitor onto the last page of Patient Logbook.  Place Patient  Logbook in the blue and white box. Use locking tab on box and tape box closed  securely. The blue and white box has prepaid postage on it. Please place it in the mailbox as  soon as possible. Your physician should have your test results approximately 7 days after the  monitor has been mailed back to [facility].  Call [facility] Customer Care at [phone] if you have questions regarding  your ZIO XT patch monitor. Call them immediately if you see an orange light  blinking on your  monitor.  If your monitor falls off in less than 4 days, contact our Monitor department at [phone].  If your monitor becomes loose or falls off after 4 days call [employer] at [phone] for  suggestions on securing your monitor     Follow-Up: At [facility], you and your health needs are our priority.  As part of our continuing mission to provide you with exceptional heart care, we have created designated Provider Care Teams.  These Care Teams include your primary Cardiologist (physician) and Advanced Practice Providers (APPs -  Physician Assistants and Nurse Practitioners) who all work together to provide you with the care you need, when you need it.  We recommend signing up for the patient portal called "MyChart".  Sign up information is provided on this After Visit Summary.  MyChart is used to connect with patients for Virtual Visits (Telemedicine).  Patients are able to view lab/test results, encounter notes, upcoming appointments, etc.  Non-urgent messages can be sent to your provider as well.   To learn more about what you can do with MyChart, go to [email or web address].    Your next appointment:    Follow up with Dr Graciela Husbands as needed

## 2020-12-06 NOTE — Addendum Note (Signed)
Addended by: Alois Cliche on: 12/06/2020 06:07 PM   Modules accepted: Orders

## 2020-12-09 ENCOUNTER — Ambulatory Visit (INDEPENDENT_AMBULATORY_CARE_PROVIDER_SITE_OTHER): Payer: Medicare HMO

## 2020-12-09 DIAGNOSIS — R002 Palpitations: Secondary | ICD-10-CM

## 2020-12-09 NOTE — Progress Notes (Unsigned)
Patient enrolled for [employer] to mail a 7 day ZIO XT monitor to address on file. 

## 2020-12-15 DIAGNOSIS — R002 Palpitations: Secondary | ICD-10-CM

## 2020-12-18 ENCOUNTER — Ambulatory Visit
Admission: RE | Admit: 2020-12-18 | Discharge: 2020-12-18 | Disposition: A | Discharge status: Home / Self Care | Payer: Medicare HMO | Source: Ambulatory Visit | Attending: Nephrology | Admitting: Nephrology

## 2020-12-18 DIAGNOSIS — N183 Chronic kidney disease, stage 3 unspecified: Secondary | ICD-10-CM | POA: Diagnosis not present

## 2020-12-20 ENCOUNTER — Institutional Professional Consult (permissible substitution): Payer: Medicare HMO | Admitting: Internal Medicine

## 2020-12-31 DIAGNOSIS — R002 Palpitations: Secondary | ICD-10-CM | POA: Diagnosis not present

## 2021-01-11 NOTE — Progress Notes (Signed)
Cardiology Office Note:    Date:  01/13/2021   ID:  Michael Banks, DOB 1951/04/15, MRN [id number]  PCP:  Meriam Sprague, MD    [facility]  Cardiologist:  Tobias Alexander, MD (Inactive) Advanced Practice Provider:  No care team member to display Electrophysiologist:  None   Referring MD: Meriam Sprague, MD    History of Present Illness:    Michael Banks is a 70 y.o. male with a hx of CAD s/p 5v CABG in 2019 with MVR, hypertension, HLD, tobacco abuse, and DMII who was previously followed by Dr. Delton See who now returns to clinic for follow-up.  The patient was admitted 04/2018 with shortness of breath and orthopnea.  Echo showed severely reduced LV function ejection fraction 20 to 25% with moderate MR.  Cardiac catheterization revealed severe CAD and severe MR.  Mild cardio viability study revealed viability in 12 out of 17 segments.  He underwent CABG x5 and MV repair 05/13/2018.  Briefly had atrial fibrillation postop converted to normal sinus rhythm with amiodarone.  He is to be maintained on Coumadin and aspirin per CVTS.  No ace inhibitor or ARB or Spironolactone given because of elevated creatinine in the hospital.  Saw Dr. Delton See on 10/06/19 where he was doing very well. Was not able to start entresto due to cost. TTE 08/21/19 with LVEF 25-30%, G2DD, trivial MR with mean gradient . Noted to have AAA which is now monitored by Dr. Chestine Spore. Currently measures 4.89cm; plan to intervene if greater than 5.5.  During visit on 09/19/20, we started him on spironolactone but Cr rose to 1.4. We stopped it and re-check his Cr the next week and it continued to rise to 1.9 despite stopping the medication. Given concern for possible fluid overload, the patient was brought back to clinic for further evaluation.  Was seen in clinic on 10/09/20. We checked BNP and it was elevated at 1053. We recommended increasing his diuretics, however, Cr continued to rise. We  therefore referred to renal at that time. Ultimately, it was determined that his renal dysfunction was secondary to a kidney stone.  Was evaluated by Dr. Graciela Husbands on 12/06/20 where he had been off GDMT due to worsening renal function. He was resumed on entresto with goal to reassess LVEF once tolerating GDMT to determine if needs ICD. The patient has not started the medication yet.  Today, the patient states he feels overall well. He saw Dr. Kathrene Bongo at [facility] where it was thought that worsening renal function was secondary to a kidney stone. Last Cr 1.89 on 11/29/20 from their records. He has not yet resumed his entresto, but is willing to do so today. He denies any chest pain, palpitations, SOB, LE edema or orthopnea. He states he does not want to do any intervention that will not make him feel better or improve his pumping function of his heart.   Past Medical History:  Diagnosis Date   Arthritis    Diabetes (HCC)    Dilated cardiomyopathy (HCC)    Gallstones    Hydronephrosis    Hyperglycemia    Hypertension    Kidney stone    UTI (urinary tract infection)     Past Surgical History:  Procedure Laterality Date   CORONARY ARTERY BYPASS GRAFT N/A 05/13/2018   Procedure: CORONARY ARTERY BYPASS GRAFTING (CABG) times _five__ using left internal mammary artery and right  greater saphenous vein harvested endoscopically.;  Surgeon: Michael Slot, MD;  Location: [facility]  OR;  Service: Open Heart Surgery;  Laterality: N/A;   KNEE ARTHROSCOPY     MITRAL VALVE REPAIR N/A 05/13/2018   Procedure: Mitral Valve Repair;  Surgeon: Michael Slot, MD;  Location: [facility] OR;  Service: Open Heart Surgery;  Laterality: N/A;   NOSE SURGERY     RIGHT/LEFT HEART CATH AND CORONARY ANGIOGRAPHY N/A 05/05/2018   Procedure: RIGHT/LEFT HEART CATH AND CORONARY ANGIOGRAPHY;  Surgeon: Michael Lex, MD;  Location: [facility] INVASIVE CV LAB;  Service: Cardiovascular;  Laterality: N/A;   TEE WITHOUT  CARDIOVERSION N/A 05/13/2018   Procedure: TRANSESOPHAGEAL ECHOCARDIOGRAM (TEE);  Surgeon: Michael Slot, MD;  Location: [facility] OR;  Service: Open Heart Surgery;  Laterality: N/A;   ULTRASOUND GUIDANCE FOR VASCULAR ACCESS  05/05/2018   Procedure: Ultrasound Guidance For Vascular Access;  Surgeon: Michael Lex, MD;  Location: [facility] INVASIVE CV LAB;  Service: Cardiovascular;;    Current Medications: Current Meds  Medication Sig   aspirin EC 81 MG EC tablet Take 1 tablet (81 mg total) by mouth daily.   atorvastatin (LIPITOR) 80 MG tablet TAKE 1 TABLET BY MOUTH EVERY DAY AT 6 PM   carvedilol (COREG) 25 MG tablet TAKE 1 TABLET BY MOUTH TWICE A DAY   dapagliflozin propanediol (FARXIGA) 10 MG TABS tablet Take 1 tablet (10 mg total) by mouth daily before breakfast.   esomeprazole (NEXIUM) 40 MG capsule Take 1 capsule (40 mg total) by mouth at bedtime.   sacubitril-valsartan (ENTRESTO) 24-26 MG Take 1 tablet by mouth 2 (two) times daily.   Simethicone (GAS-X EXTRA STRENGTH PO) Take by mouth. Gel caps uses as needed     Allergies:   Patient has no known allergies.   Social History   Socioeconomic History   Marital status: Single    Spouse name: Not on file   Number of children: Not on file   Years of education: Not on file   Highest education level: Not on file  Occupational History   Not on file  Tobacco Use   Smoking status: Former    Types: Cigarettes    Quit date: 2018    Years since quitting: 4.5   Smokeless tobacco: Never  Vaping Use   Vaping Use: Never used  Substance and Sexual Activity   Alcohol use: Never   Drug use: Never   Sexual activity: Not on file  Other Topics Concern   Not on file  Social History Narrative   Not on file   Social Determinants of Health   Financial Resource Strain: Not on file  Food Insecurity: Not on file  Transportation Needs: Not on file  Physical Activity: Not on file  Stress: Not on file  Social Connections: Not on file      Family History: The patient's family history includes CAD in his brother; CVA in his father.  ROS:   Please see the history of present illness.    Review of Systems  Constitutional:  Negative for chills and fever.  HENT:  Negative for hearing loss.   Eyes:  Negative for blurred vision.  Respiratory:  Negative for shortness of breath.   Cardiovascular:  Negative for chest pain, palpitations, orthopnea, claudication, leg swelling and PND.  Gastrointestinal:  Positive for heartburn. Negative for blood in stool and melena.  Genitourinary:  Negative for flank pain and hematuria.  Musculoskeletal:  Positive for joint pain and myalgias. Negative for falls.  Neurological:  Negative for dizziness and loss of consciousness.  Psychiatric/Behavioral:  Negative for substance  abuse.    EKGs/Labs/Other Studies Reviewed:    The following studies were reviewed today: TTE Dec 01, 2020: IMPRESSIONS   1. Left ventricular ejection fraction, by estimation, is 30 to 35%. The  left ventricle has moderately decreased function. The left ventricle has  no regional wall motion abnormalities. There is mild left ventricular  hypertrophy of the basal-septal  segment. Left ventricular diastolic parameters are indeterminate. Elevated  left ventricular end-diastolic pressure.   2. Right ventricular systolic function is normal. The right ventricular  size is normal. Tricuspid regurgitation signal is inadequate for assessing  PA pressure.   3. The mitral valve has been repaired/replaced. Trivial mitral valve  regurgitation. Mild mitral stenosis. The mean mitral valve gradient is 5.0  mmHg.   4. The aortic valve is normal in structure. Aortic valve regurgitation is  mild. No aortic stenosis is present.   TTE 08/21/19:  1. Left ventricular ejection fraction, by estimation, is 25 to 30%. The  left ventricle has severely decreased function. The left ventricle  demonstrates global hypokinesis. There is mild left  ventricular  hypertrophy. Left ventricular diastolic parameters   are consistent with Grade II diastolic dysfunction (pseudonormalization).  Elevated left atrial pressure. Patient refused Definity.   2. Right ventricular systolic function is mildly reduced. The right  ventricular size is mildly enlarged.   3. The mitral valve has been repaired/replaced. There is a 32 mm  prosthetic annuloplasty ring present in the mitral position. Trivial  regurgitation. Mean gradient 4 mmHg.   4. The aortic valve was not well visualized. Aortic valve regurgitation  is mild.   5. Compared to prior TTE on 01/31/19, no significant change in LV systolic  Function  Cath 05/05/18: ISCHEMIC CARDIOMYOPATHY Hemodynamic findings consistent with moderate pulmonary hypertension -secondary to pulmonary venous hypertension LV end diastolic pressure and pulmonary capillary wedge pressure are moderately elevated. There is severe (4+) mitral regurgitation. SEVERE MULTIVESSEL DISEASE Prox RCA to Mid RCA lesion is 90% stenosed. Mid RCA to Dist RCA lesion is 100% stenosed with 100% stenosed side branch in Post Atrio. Prox LAD lesion is 99% stenosed. Mid LAD-1 lesion is 80% stenosed. Mid LAD-2 lesion is 90% stenosed. Dist LAD lesion is 85% stenosed. Prox Cx lesion is 95% stenosed. 2nd Mrg lesion is 90% stenosed with 90% stenosed side branch in Lat 2nd Mrg.   SUMMARY Severe multivessel CAD: Diffuse proximal RCA disease with mid 100% CTO filling with bridging collaterals and right-to-left collaterals, separate ostium circumflex with proximal 95% stenosis and bifurcation 90% stenosis of OM1, 99% mid LAD after very large septal perforator trunk (perfusing PDA) with bridging collaterals filling mid to distal LAD with a large diagonal branch and diffuse disease throughout the distal LAD. Moderate pulmonary hypertension likely secondary to pulmonary venous congestion and mitral regurgitation. At least moderate-severe if not severe  mitral regurgitation (likely ischemic) Moderately elevated LVEDP and PCWP consistent with acute diastolic heart failure in conjunction with acute systolic heart failure from Ischemic Cardiomyopathy  At least mildly reduced cardiac output of 4.8, index 2.39.     RECOMMENDATION Return to nursing unit for TR band removal. Continue aggressive CHF management The patient would likely best benefit from CABG plus possible mitral valve repair. --We will consult CVTS Will place on IV heparin 8 hours after sheath removal Consider viability study as part of assessment for revascularization options. Percutaneous options would be limited and extremely high risk given his low output and multivessel disease.  Would likely need support with Impella Recommend Aspirin 81mg  daily for  severe CAD.  Ultrasound 05/21/20: Abdominal Aorta: There is evidence of abnormal dilatation of the distal  Abdominal aorta. Previous diameter measurement was 4.6 cm obtained on  08/21/19 by CTA.   EKG: 10/09/20: EKG is not ordered today.  Recent Labs: 10/09/2020: NT-Pro BNP 1,053 10/17/2020: BUN 32; Creatinine, Ser 2.25; Potassium 5.0; Sodium 138   Recent Lipid Panel    Component Value Date/Time   CHOL 122 08/03/2018 0819   TRIG 76 08/03/2018 0819   HDL 46 08/03/2018 0819   CHOLHDL 2.7 08/03/2018 0819   CHOLHDL 4.2 05/04/2018 0433   VLDL 11 05/04/2018 0433   LDLCALC 61 08/03/2018 0819     Risk Assessment/Calculations:       Physical Exam:    VS:  BP 130/70 (BP Location: Left Arm, Patient Position: Sitting, Cuff Size: Normal)   Pulse (!) 57   Ht 5\' 10"  (1.778 m)   Wt 185 lb (83.9 kg)   SpO2 97%   BMI 26.54 kg/m     Wt Readings from Last 3 Encounters:  01/13/21 185 lb (83.9 kg)  12/06/20 187 lb (84.8 kg)  11/19/20 186 lb 9.6 oz (84.6 kg)     GEN:  Comfortable, NAD HEENT: Normal NECK: No JVD; No carotid bruits CARDIAC: RRR, no murmurs RESPIRATORY:  Clear to auscultation without rales, wheezing or  rhonchi  ABDOMEN: Soft, non-tender, non-distended MUSCULOSKELETAL:  No edema; No deformity  SKIN: Warm and dry NEUROLOGIC:  Alert and oriented x 3 PSYCHIATRIC:  Normal affect   ASSESSMENT:    1. Ischemic cardiomyopathy   2. Chronic systolic heart failure (HCC)   3. S/P CABG x 5   4. Medication management   5. Elevated serum creatinine   6. 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    PLAN:    In order of problems listed above:  #Ischemic Cardiomyopathy: #Chronic Systolic HF: TTE 11/21/20 with LVEF 25-30%-->30-35% in 11/2020, but was not on GDMT for >65months at that time.  Appears euvolemic today with no HF symptoms. Given reduced LVEF and improvement in renal function, will add GDMT as tolerated. -Continue coreg 25mg  BID -Start entresto 24/26mg  BID -Start farxiga 10mg  daily -Will start spiro at next visit pending labs -Check BMET -Refer to Pharm D for medication titration -Repeat TTE once on GDMT and if EF <35%, will rediscuss ICD (patient states he may not want to have this done in the future regardless of EF if it does not make him feel better)  #CAD s/p 5V CABG with MVR in 2019: No anginal symptoms. Does not appear grossly overloaded -Continue ASA 81mg  daily and lipitor 80mg  daily -Continue coreg 25mg  BID as above -Starting entresto as above  #AAA: Monitored by Dr. . Measures 4.89 on most recent ultrasound 05/21/20. -Continue surveillance ultrasounds q52months per Dr. -Threshold to repair is 5.5cm unless rapid growth or symptoms -Continue aggressive blood pressure control -Continue ASA 81mg  daily and lipitor 80mg  daily -Follow-up with Dr. as scheduled  #AKI on CKD stage IIIa: Acute rise in Cr thought to be secondary to renal stone. Now improved to 1.89. Followed by Kid[facility]. Given CKD and HF, will try to add entresto and farxiga as above. -Start entresto 24/26mg  BID -Start farxiga 10mg  daily -BMET next week -Follow-up with  PharmD  #HTN: Controlled.  -Continue coreg 25mg  BID -Start entresto 24/26mg  BID as above  #HLD: LDL at goal at 50. -Continue lipitor 80mg  daily  #DMII: Diet controlled. A1C 6.5. -Continue lifestyle modifications -Start farxiga as above  #  GERD: -Encouraged him to start the nexium 40mg  daily  Medication Adjustments/Labs and Tests Ordered: Current medicines are reviewed at length with the patient today.  Concerns regarding medicines are outlined above.  Orders Placed This Encounter  Procedures   Basic metabolic panel   AMB Referral to [facility] Pharm-D   Meds ordered this encounter  Medications   sacubitril-valsartan (ENTRESTO) 24-26 MG    Sig: Take 1 tablet by mouth 2 (two) times daily.    Dispense:  60 tablet    Refill:  1    Please Honor Card patient is presenting for Cecelia Byarsntresto RXBIN: [id number]; Alvira PhilipsXGRP: [id number]: [id number]; RXPCN: [id number]; RX: [id number]: [id number]   dapagliflozin propanediol (FARXIGA) 10 MG TABS tablet    Sig: Take 1 tablet (10 mg total) by mouth daily before breakfast.    Dispense:  90 tablet    Refill:  1    Patient Instructions  Medication Instructions:   START TAKING ENTRESTO 24/26 MG DOSE--TAKE ONE TABLET BY MOUTH TWICE DAILY  START TAKING FARXIGA 10 MG BY MOUTH DAILY BEFORE BREAKFAST  *If you need a refill on your cardiac medications before your next appointment, please call your pharmacy*   Lab Work:  IN 7-10 DAYS HERE IN THE OFFICE--CHECK BMET  If you have labs (blood work) drawn today and your tests are completely normal, you will receive your results only by: MyChart Message (if you have MyChart) OR A paper copy in the mail If you have any lab test that is abnormal or we need to change your treatment, we will call you to review the results.  You have been referred to OUR PHARMACIST HERE IN [facility] FOR MED MANAGEMENT AND TITRATION OF ENTRESTO--NEEDS TO BE SCHEDULED IN THE NEXT WEEK    Follow-Up:  6 MONTHS IN THE OFFICE WITH DR. Shari ProwsPEMBERTON      Follow-up in 3 months.   I, Meriam SpragueHeather E Kori Goins, MD, have reviewed all documentation for this visit. The documentation on 01/13/21 for the exam, diagnosis, procedures, and orders are all accurate and complete.  Signed, Meriam SpragueHeather E Deaven Urwin, MD  01/13/2021 1:37 PM    [facility]

## 2021-01-13 ENCOUNTER — Other Ambulatory Visit: Payer: Self-pay

## 2021-01-13 ENCOUNTER — Ambulatory Visit (INDEPENDENT_AMBULATORY_CARE_PROVIDER_SITE_OTHER): Payer: Medicare HMO | Admitting: Cardiology

## 2021-01-13 VITALS — BP 130/70 | HR 57 | Ht 70.0 in | Wt 185.0 lb

## 2021-01-13 DIAGNOSIS — I255 Ischemic cardiomyopathy: Secondary | ICD-10-CM

## 2021-01-13 DIAGNOSIS — I714 Abdominal aortic aneurysm, without rupture, unspecified: Secondary | ICD-10-CM

## 2021-01-13 DIAGNOSIS — Z79899 Other long term (current) drug therapy: Secondary | ICD-10-CM

## 2021-01-13 DIAGNOSIS — I25810 Atherosclerosis of coronary artery bypass graft(s) without angina pectoris: Secondary | ICD-10-CM

## 2021-01-13 DIAGNOSIS — Z9889 Other specified postprocedural states: Secondary | ICD-10-CM

## 2021-01-13 DIAGNOSIS — Z951 Presence of aortocoronary bypass graft: Secondary | ICD-10-CM | POA: Diagnosis not present

## 2021-01-13 DIAGNOSIS — R7989 Other specified abnormal findings of blood chemistry: Secondary | ICD-10-CM

## 2021-01-13 DIAGNOSIS — I5022 Chronic systolic (congestive) heart failure: Secondary | ICD-10-CM

## 2021-01-13 MED ORDER — DAPAGLIFLOZIN PROPANEDIOL 10 MG PO TABS: 10.0000 mg | ORAL_TABLET | Freq: Every day | ORAL | 1 refills | Status: DC

## 2021-01-13 MED ORDER — ENTRESTO 24-26 MG PO TABS: 1.0000 | ORAL_TABLET | Freq: Two times a day (BID) | ORAL | 1 refills | Status: DC

## 2021-01-13 NOTE — Patient Instructions (Signed)
Medication Instructions:   START TAKING ENTRESTO 24/26 MG DOSE--TAKE ONE TABLET BY MOUTH TWICE DAILY  START TAKING FARXIGA 10 MG BY MOUTH DAILY BEFORE BREAKFAST  *If you need a refill on your cardiac medications before your next appointment, please call your pharmacy*   Lab Work:  IN 7-10 DAYS HERE IN THE OFFICE--CHECK BMET  If you have labs (blood work) drawn today and your tests are completely normal, you will receive your results only by: MyChart Message (if you have MyChart) OR A paper copy in the mail If you have any lab test that is abnormal or we need to change your treatment, we will call you to review the results.  You have been referred to OUR PHARMACIST HERE IN [facility] FOR MED MANAGEMENT AND TITRATION OF ENTRESTO--NEEDS TO BE SCHEDULED IN THE NEXT WEEK    Follow-Up:  6 MONTHS IN THE OFFICE WITH DR. Shari Prows

## 2021-01-20 ENCOUNTER — Other Ambulatory Visit: Payer: Medicare HMO | Admitting: *Deleted

## 2021-01-20 ENCOUNTER — Other Ambulatory Visit: Payer: Self-pay

## 2021-01-20 DIAGNOSIS — R7989 Other specified abnormal findings of blood chemistry: Secondary | ICD-10-CM | POA: Diagnosis not present

## 2021-01-20 DIAGNOSIS — Z79899 Other long term (current) drug therapy: Secondary | ICD-10-CM

## 2021-01-20 DIAGNOSIS — I255 Ischemic cardiomyopathy: Secondary | ICD-10-CM

## 2021-01-20 DIAGNOSIS — I25810 Atherosclerosis of coronary artery bypass graft(s) without angina pectoris: Secondary | ICD-10-CM | POA: Diagnosis not present

## 2021-01-20 DIAGNOSIS — Z951 Presence of aortocoronary bypass graft: Secondary | ICD-10-CM | POA: Diagnosis not present

## 2021-01-20 DIAGNOSIS — I5022 Chronic systolic (congestive) heart failure: Secondary | ICD-10-CM

## 2021-01-20 LAB — BASIC METABOLIC PANEL
BUN/Creatinine Ratio: 12 (ref 10–24)
BUN: 26 mg/dL (ref 8–27)
CO2: 22 mmol/L (ref 20–29)
Calcium: 8.6 mg/dL (ref 8.6–10.2)
Chloride: 106 mmol/L (ref 96–106)
Creatinine, Ser: 2.23 mg/dL — ABNORMAL HIGH (ref 0.76–1.27)
Glucose: 130 mg/dL — ABNORMAL HIGH (ref 65–99)
Potassium: 4.6 mmol/L (ref 3.5–5.2)
Sodium: 141 mmol/L (ref 134–144)
eGFR: 32 mL/min/{1.73_m2} — ABNORMAL LOW (ref >59)

## 2021-01-30 ENCOUNTER — Other Ambulatory Visit: Payer: Self-pay

## 2021-01-30 ENCOUNTER — Ambulatory Visit (INDEPENDENT_AMBULATORY_CARE_PROVIDER_SITE_OTHER): Payer: Medicare HMO | Admitting: Pharmacist

## 2021-01-30 VITALS — BP 134/70 | HR 58 | Wt 184.8 lb

## 2021-01-30 DIAGNOSIS — I5022 Chronic systolic (congestive) heart failure: Secondary | ICD-10-CM

## 2021-01-30 NOTE — Progress Notes (Signed)
Patient ID: Michael Banks                 DOB: 1950/11/26                      MRN: [id number]     HPI: Michael Banks is a 70 y.o. male referred by Dr. Shari Prows to pharmacy clinic for HF medication management. PMH is significant for CAD s/p 5v CABG in 2019 with MVR, hypertension, HLD, tobacco abuse, afib, DMII, AAA and HFrEF . In march of 2022 patient was started on spironolactone but was stopped due to rise in scr. Scr continued to rise off medication. He was seen by Dr. Kathrene Bongo at [facility]. It was felt worsening kidney function was due to a kidney stone. He was resumed on Entresto in July. Most recent LVEF 30-35% on 11/2020.  At appointment with Dr. Shari Prows on 01/13/21 patient was started on Entresto 24/26mg  BID and Farxiga 10mg  daily. Repeat BMP was stable. Note by Dr. stated   "Stable labs Plan: Given drop in GFR over last three months; will defer MRA until seen by primary cardiologist"  Today he presents to pharmacy clinic for further medication titration. Symptomatically, he is feeling good. Does have some dizziness on standing up but it is getting better. Fatigue has improved. He feels better. No chest pain or palpitations. No SOB. Able to complete all ADLs. Activity level improved about 15-20% from baseline. He checks his weight at home (normal range 181 - 184 lbs). No LEE, PND, or orthopnea. Appetite has been good.   Current CHF meds: Entresto 24/26mg  twice a day, Izora Ribas 10mg  daily, carvedilol 25mg  twice a day Previously tried:  BP goal: <130/80  Family History: The patient's family history includes CAD in his brother; CVA in his father.  Social History:  Social History   Socioeconomic History   Marital status: Single    Spouse name: Not on file   Number of children: Not on file   Years of education: Not on file   Highest education level: Not on file  Occupational History   Not on file  Tobacco Use   Smoking status: Former    Types:  Cigarettes    Quit date: 2018    Years since quitting: 4.6   Smokeless tobacco: Never  Vaping Use   Vaping Use: Never used  Substance and Sexual Activity   Alcohol use: Never   Drug use: Never   Sexual activity: Not on file  Other Topics Concern   Not on file  Social History Narrative   Not on file   Social Determinants of Health   Financial Resource Strain: Not on file  Food Insecurity: Not on file  Transportation Needs: Not on file  Physical Activity: Not on file  Stress: Not on file  Social Connections: Not on file  Intimate Partner Violence: Not on file    Diet: not addressed this visit  Exercise: walks about 14 min after every meal  Home BP readings: 100/63 HR 58   Wt Readings from Last 3 Encounters:  01/13/21 185 lb (83.9 kg)  12/06/20 187 lb (84.8 kg)  11/19/20 186 lb 9.6 oz (84.6 kg)   BP Readings from Last 3 Encounters:  01/13/21 130/70  12/06/20 130/82  11/19/20 139/71   Pulse Readings from Last 3 Encounters:  01/13/21 (!) 57  12/06/20 60  11/19/20 60    Renal function: CrCl cannot be calculated (Unknown ideal weight.).  Past Medical  History:  Diagnosis Date   Arthritis    Diabetes (HCC)    Dilated cardiomyopathy (HCC)    Gallstones    Hydronephrosis    Hyperglycemia    Hypertension    Kidney stone    UTI (urinary tract infection)     Current Outpatient Medications on File Prior to Visit  Medication Sig Dispense Refill   aspirin EC 81 MG EC tablet Take 1 tablet (81 mg total) by mouth daily.     atorvastatin (LIPITOR) 80 MG tablet TAKE 1 TABLET BY MOUTH EVERY DAY AT 6 PM 90 tablet 2   carvedilol (COREG) 25 MG tablet TAKE 1 TABLET BY MOUTH TWICE A DAY 180 tablet 1   dapagliflozin propanediol (FARXIGA) 10 MG TABS tablet Take 1 tablet (10 mg total) by mouth daily before breakfast. 90 tablet 1   esomeprazole (NEXIUM) 40 MG capsule Take 1 capsule (40 mg total) by mouth at bedtime. 90 capsule 1   sacubitril-valsartan (ENTRESTO) 24-26 MG Take 1  tablet by mouth 2 (two) times daily. 60 tablet 1   Simethicone (GAS-X EXTRA STRENGTH PO) Take by mouth. Gel caps uses as needed     No current facility-administered medications on file prior to visit.    No Known Allergies   Assessment/Plan:  1. CHF - Blood pressure in clinic today a little above goal of <130/80. However the reading he got at home this AM was 100/63. This is the only reading he had while on Entresto. Per patient, his BP would be about 137 systolic prior to starting Entresto. Do not want to make medication adjustment based off of 2 BP readings. I have asked patient to check his blood pressure daily and bring his readings and cuff with him to next appointment. Continue Entresto 24/26mg  twice a day, Farxiga 10mg  daily and carvedilol 25mg  twice a day. Follow up in 3 weeks.  Thank you,  , Pharm.D, BCPS, CPP [facility]  [address], [place], [address] [place]  Phone: [phone]; Fax: [phone]

## 2021-01-30 NOTE — Patient Instructions (Addendum)
It was nice to meet you!  Please continue Entresto 24/26mg  twice a day, Farxiga 10mg  daily and carvedilol 25mg  twice a day.  Please check your blood pressure once a day. Please bring your blood pressure readings and monitor with you to your next appointment  Please call me at [phone] with any questions

## 2021-02-04 ENCOUNTER — Telehealth: Payer: Self-pay | Admitting: Pharmacist

## 2021-02-04 MED ORDER — METOPROLOL SUCCINATE ER 200 MG PO TB24: 200.0000 mg | ORAL_TABLET | Freq: Every day | ORAL | 6 refills | Status: DC

## 2021-02-04 NOTE — Telephone Encounter (Signed)
Pt called clinic, reports low BP at home of 94/68 and 93/77. Reports fatigue and balance issues when he stands.  CHF meds not changed at last visit on 8/11 due to reported low BP at home (normal reading in clinic). Will change carvedilol 25mg  BID to equivalent dose of Toprol 200mg  once daily for continued CHF benefit but less BP lowering effects. Pt will continue on Entresto 24-26mg  BID and Farxiga 10mg  daily for his CHF, and will keep f/u appt with Melissa on 9/1. Advised him to bring in home BP cuff to next visit as well so that accuracy can be confirmed.

## 2021-02-06 ENCOUNTER — Telehealth: Payer: Self-pay | Admitting: Cardiology

## 2021-02-06 DIAGNOSIS — R002 Palpitations: Secondary | ICD-10-CM | POA: Insufficient documentation

## 2021-02-06 NOTE — Progress Notes (Signed)
Patient Care Team: Meriam Sprague, MD as PCP - General (Cardiology) Lars Masson, MD (Inactive) as PCP - Cardiology (Cardiology)   HPI Michael Banks is a 70 y.o. male seen in follow-up for consideration of an ICD for depressed LV function in the setting of prior bypass surgery and mitral valve repair.  At the initial visit, he had discontinued many of his medications and ejection fraction was depressed.  In the interim, he has resumed [name] and reports that they are working well for him.,  He feels great.   The patient denies chest pain, shortness of breath, nocturnal dyspnea, orthopnea or peripheral edema.  There have been no palpitations, lightheadedness or syncope.  Fatigue with exertion   His carvedilol was decreased to metoprolol because of issues of low blood pressure.  He went from 25 twice daily--200 daily.  The latter has been associated with diarrhea and the former was not, was better tolerated.     He checks his blood pressure at home regularly.      DATE TEST EF   11/19 Echo  20-25 %   11/19 TEE  20-25 %   2/20 Echo 25-30%   820 Echo   25-30%   3/21 Echo   25-30%   06/22 Echo   30-35%    Date Cr K Hgb  09/20 1.00 3.9 12.2  02/21 1.03 4.5 (09/20) 12.2  04/22 2.25 5.0   8/22 2.23 4.6     Records and Results Reviewed    Past Medical History:  Diagnosis Date   Arthritis    Diabetes (HCC)    Dilated cardiomyopathy (HCC)    Gallstones    Hydronephrosis    Hyperglycemia    Hypertension    Kidney stone    UTI (urinary tract infection)       Surgical History:  Past Surgical History:  Procedure Laterality Date   CORONARY ARTERY BYPASS GRAFT N/A 05/13/2018   Procedure: CORONARY ARTERY BYPASS GRAFTING (CABG) times _five__ using left internal mammary artery and right  greater saphenous vein harvested endoscopically.;  Surgeon: Loreli Slot, MD;  Location: [facility] OR;  Service: Open Heart Surgery;  Laterality: N/A;   KNEE  ARTHROSCOPY     MITRAL VALVE REPAIR N/A 05/13/2018   Procedure: Mitral Valve Repair;  Surgeon: Loreli Slot, MD;  Location: [facility] OR;  Service: Open Heart Surgery;  Laterality: N/A;   NOSE SURGERY     RIGHT/LEFT HEART CATH AND CORONARY ANGIOGRAPHY N/A 05/05/2018   Procedure: RIGHT/LEFT HEART CATH AND CORONARY ANGIOGRAPHY;  Surgeon: Marykay Lex, MD;  Location: [facility] INVASIVE CV LAB;  Service: Cardiovascular;  Laterality: N/A;   TEE WITHOUT CARDIOVERSION N/A 05/13/2018   Procedure: TRANSESOPHAGEAL ECHOCARDIOGRAM (TEE);  Surgeon: Loreli Slot, MD;  Location: [facility] OR;  Service: Open Heart Surgery;  Laterality: N/A;   ULTRASOUND GUIDANCE FOR VASCULAR ACCESS  05/05/2018   Procedure: Ultrasound Guidance For Vascular Access;  Surgeon: Marykay Lex, MD;  Location: [facility] INVASIVE CV LAB;  Service: Cardiovascular;;     Home Meds: Current Meds  Medication Sig   aspirin EC 81 MG EC tablet Take 1 tablet (81 mg total) by mouth daily.   atorvastatin (LIPITOR) 80 MG tablet TAKE 1 TABLET BY MOUTH EVERY DAY AT 6 PM   dapagliflozin propanediol (FARXIGA) 10 MG TABS tablet Take 1 tablet (10 mg total) by mouth daily before breakfast.   esomeprazole (NEXIUM) 40 MG capsule Take 1 capsule (40 mg total) by  mouth at bedtime.   sacubitril-valsartan (ENTRESTO) 24-26 MG Take 1 tablet by mouth 2 (two) times daily.   Simethicone (GAS-X EXTRA STRENGTH PO) Take by mouth. Gel caps uses as needed    Allergies: No Known Allergies    Physical Exam: BP (!) 152/81   Pulse (!) 59   Ht 5\' 10"  (1.778 m)   Wt 182 lb 3.2 oz (82.6 kg)   SpO2 97%   BMI 26.14 kg/m  Well developed and nourished in no acute distress HENT normal Neck supple with JVP-  flat   Clear Regular rate and rhythm, no murmurs or gallops Abd-soft with active BS No Clubbing cyanosis edema Skin-warm and dry A & Oriented  Grossly normal sensory and motor function  ECG sinus at 59 Intervals 20/11/45 Inferior wall MI   Assessment and  Plan:  Ischemic cardiomyopathy prior bypass surgery/MVR  Acute/chronic renal injury  Congestive heart failure-class II  PVCs  Sinus bradycardia   Patient is feeling much better on Entresto now class II symptoms.  Continue Entresto 24/26 and he will also be following up with 22/11/45 kidney.  We will continue Farxiga 10.  Apparently in the past spironolactone was associated aggravation of his creatinine so we will avoid it.  We will resume him on carvedilol as he stopped metoprolol secondary to diarrhea at 12.5 mg twice daily.  We will take the lower dose because he is relatively bradycardic although I suspect some of this is left over effect from metoprolol.  With the resuming of his beta-blocker little bit more time we will plan to reassess left ventricular function in about 3 months and see whether he has not improved his LV function to the point that an ICD becomes less valuable to him.  Is excited about this possibility.    Current medicines are reviewed at length with the patient today .  The patient does not  have concerns regarding medicines.  I,Mathew Stumpf,acting as a scribe for 12-31-2005, MD.,have documented all relevant documentation on the behalf of Sherryl Manges, MD,as directed by  Sherryl Manges, MD while in the presence of Sherryl Manges, MD.   I, Sherryl Manges, MD, have reviewed all documentation for this visit. The documentation on 02/07/21 for the exam, diagnosis, procedures, and orders are all accurate and complete.

## 2021-02-06 NOTE — Telephone Encounter (Signed)
Patient presented to clinic today. Requesting that we check his home BP monitor to see if it is correct. States he is getting low BP and high BP. Also getting irregular rhythm reports on BP monitor. I saw note from Boneta Lucks that was forwarded to Dr. Graciela Husbands and Mindi Junker about his zio patch results. I spoke with Tinnie Gens who states his monitor showed chronotropic incompetence.  We both spoke with patient. I advised patient to STOP taking metoprolol and do not take carvedilol (previously on). His home meter was compared to clinic cuff  178/80 clinic Home 172/92  Patient will see Dr. Graciela Husbands tomorrow at 11:30

## 2021-02-06 NOTE — Telephone Encounter (Signed)
   STAT if HR is under 50 or over 120 (normal HR is 60-100 beats per minute)  What is your heart rate? Irregular HR  Do you have a log of your heart rate readings (document readings)?   Do you have any other symptoms? Pt said he spoke with Michael Banks the other day about his BP issue and today his BP cuff keeps telling him he had an irregular HR, he would like to speak with a nurse

## 2021-02-06 NOTE — Telephone Encounter (Signed)
Pt came to office.  Pt with concerns about his blood pressure, and his blood pressure monitor advising that his heart rate was irregular.  Per review of HM-Pt with chronotropic incompetence.  Pharmacy advised of new HM readings.  Pt will have follow up with Dr. Graciela Husbands tomorrow 02/07/2021 to discuss HM results.  Pharmacy will discontinue beta block.

## 2021-02-07 ENCOUNTER — Encounter: Payer: Self-pay | Admitting: Internal Medicine

## 2021-02-07 ENCOUNTER — Other Ambulatory Visit: Payer: Self-pay

## 2021-02-07 ENCOUNTER — Ambulatory Visit (INDEPENDENT_AMBULATORY_CARE_PROVIDER_SITE_OTHER): Payer: Medicare HMO | Admitting: Internal Medicine

## 2021-02-07 VITALS — BP 152/81 | HR 59 | Ht 70.0 in | Wt 182.2 lb

## 2021-02-07 DIAGNOSIS — I5022 Chronic systolic (congestive) heart failure: Secondary | ICD-10-CM | POA: Diagnosis not present

## 2021-02-07 DIAGNOSIS — R002 Palpitations: Secondary | ICD-10-CM

## 2021-02-07 DIAGNOSIS — I255 Ischemic cardiomyopathy: Secondary | ICD-10-CM

## 2021-02-07 MED ORDER — CARVEDILOL 12.5 MG PO TABS: 12.5000 mg | ORAL_TABLET | Freq: Two times a day (BID) | ORAL | 3 refills | Status: DC

## 2021-02-07 NOTE — Patient Instructions (Addendum)
Medication Instructions:  Your physician has recommended you make the following change in your medication:   ** Begin Carvedilol 12.5mg  - 1 tablet by mouth twice daily.   *If you need a refill on your cardiac medications before your next appointment, please call your pharmacy*   Lab Work: None ordered.  If you have labs (blood work) drawn today and your tests are completely normal, you will receive your results only by: MyChart Message (if you have MyChart) OR A paper copy in the mail If you have any lab test that is abnormal or we need to change your treatment, we will call you to review the results.   Testing/Procedures: Your physician has requested that you have an echocardiogram. Echocardiography is a painless test that uses sound waves to create images of your heart. It provides your doctor with information about the size and shape of your heart and how well your heart's chambers and valves are working. This procedure takes approximately one hour. There are no restrictions for this procedure.     Follow-Up: At [facility], you and your health needs are our priority.  As part of our continuing mission to provide you with exceptional heart care, we have created designated Provider Care Teams.  These Care Teams include your primary Cardiologist (physician) and Advanced Practice Providers (APPs -  Physician Assistants and Nurse Practitioners) who all work together to provide you with the care you need, when you need it.  We recommend signing up for the patient portal called "MyChart".  Sign up information is provided on this After Visit Summary.  MyChart is used to connect with patients for Virtual Visits (Telemedicine).  Patients are able to view lab/test results, encounter notes, upcoming appointments, etc.  Non-urgent messages can be sent to your provider as well.   To learn more about what you can do with MyChart, go to [email or web address].    Your next appointment:   4  month(s)  The format for your next appointment:   In Person  Provider:   Sherryl Manges, MD

## 2021-02-20 ENCOUNTER — Ambulatory Visit (INDEPENDENT_AMBULATORY_CARE_PROVIDER_SITE_OTHER): Payer: Medicare HMO | Admitting: Pharmacist

## 2021-02-20 ENCOUNTER — Other Ambulatory Visit: Payer: Self-pay

## 2021-02-20 VITALS — BP 140/62 | HR 59

## 2021-02-20 DIAGNOSIS — I509 Heart failure, unspecified: Secondary | ICD-10-CM | POA: Diagnosis not present

## 2021-02-20 NOTE — Progress Notes (Signed)
Patient ID: Michael Banks                 DOB: [birth year]                      MRN: [id number]     HPI: Michael Banks is a 70 y.o. male referred by Dr. Shari Prows to pharmacy clinic for HF medication management. PMH is significant for CAD s/p 5v CABG in 2019 with MVR, hypertension, HLD, tobacco abuse, afib, DMII, AAA and HFrEF . In march of 2022 patient was started on spironolactone but was stopped due to rise in scr. Scr continued to rise off medication. He was seen by Dr. Kathrene Bongo at [facility]. It was felt worsening kidney function was due to a kidney stone. He was resumed on Entresto in July. Most recent LVEF 30-35% on 11/2020.  At appointment with Dr. Shari Prows on 01/13/21 patient was started on Entresto 24/26mg  BID and Farxiga 10mg  daily. Repeat BMP was stable. Note by Dr. stated   "Stable labs Plan: Given drop in GFR over last three months; will defer MRA until seen by primary cardiologist"  He was seen by Dr. Izora Ribas to review his monitor results. HR of just 40-70 was thought to be due to too high of a dose of beta blockers. Carvedilol was decreased to 12.5mg  twice a day.  Today he presents to pharmacy clinic for further medication titration. Symptomatically, he is feeling very good. Feels much better on lower carvedilol dose. Does have some dizziness on standing up but it is a lot better than before. Fatigue has improved. He feels better. No chest pain or palpitations. No SOB. Able to complete all ADLs. He checks his weight at home (normal range 180 - 184 lbs). No LEE, PND, or orthopnea. Appetite has been good.   His home meter was compared to clinic cuff 178/80 clinic Home 172/92  Current CHF meds: Entresto 24/26mg  twice a day, Graciela Husbands 10mg  daily, carvedilol 12.5mg  twice a day Previously tried:  BP goal: <130/80  Family History: The patient's family history includes CAD in his brother; CVA in his father.  Social History:  Social History   Socioeconomic  History   Marital status: Single    Spouse name: Not on file   Number of children: Not on file   Years of education: Not on file   Highest education level: Not on file  Occupational History   Not on file  Tobacco Use   Smoking status: Former    Types: Cigarettes    Quit date: 2018    Years since quitting: 4.6   Smokeless tobacco: Never  Vaping Use   Vaping Use: Never used  Substance and Sexual Activity   Alcohol use: Never   Drug use: Never   Sexual activity: Not on file  Other Topics Concern   Not on file  Social History Narrative   Not on file   Social Determinants of Health   Financial Resource Strain: Not on file  Food Insecurity: Not on file  Transportation Needs: Not on file  Physical Activity: Not on file  Stress: Not on file  Social Connections: Not on file  Intimate Partner Violence: Not on file    Diet: not addressed this visit  Exercise: walks about 14 min after every meal  Home BP readings: 114/78, 110/73, 99/72, 114/71, 93/65, 137/76, 126/80, 99/68, 124/83, 81/71, 105/76, 106/72, 115/91, 126/84 HR 51-62  Wt Readings from Last 3 Encounters:  02/07/21 182  lb 3.2 oz (82.6 kg)  01/30/21 184 lb 12.8 oz (83.8 kg)  01/13/21 185 lb (83.9 kg)   BP Readings from Last 3 Encounters:  02/07/21 (!) 152/81  01/30/21 134/70  01/13/21 130/70   Pulse Readings from Last 3 Encounters:  02/07/21 (!) 59  01/30/21 (!) 58  01/13/21 (!) 57    Renal function: CrCl cannot be calculated (Patient's most recent lab result is older than the maximum 21 days allowed.).  Past Medical History:  Diagnosis Date   Arthritis    Diabetes (HCC)    Dilated cardiomyopathy (HCC)    Gallstones    Hydronephrosis    Hyperglycemia    Hypertension    Kidney stone    UTI (urinary tract infection)     Current Outpatient Medications on File Prior to Visit  Medication Sig Dispense Refill   aspirin EC 81 MG EC tablet Take 1 tablet (81 mg total) by mouth daily.     atorvastatin  (LIPITOR) 80 MG tablet TAKE 1 TABLET BY MOUTH EVERY DAY AT 6 PM 90 tablet 2   carvedilol (COREG) 12.5 MG tablet Take 1 tablet (12.5 mg total) by mouth 2 (two) times daily. 180 tablet 3   dapagliflozin propanediol (FARXIGA) 10 MG TABS tablet Take 1 tablet (10 mg total) by mouth daily before breakfast. 90 tablet 1   esomeprazole (NEXIUM) 40 MG capsule Take 1 capsule (40 mg total) by mouth at bedtime. 90 capsule 1   sacubitril-valsartan (ENTRESTO) 24-26 MG Take 1 tablet by mouth 2 (two) times daily. 60 tablet 1   Simethicone (GAS-X EXTRA STRENGTH PO) Take by mouth. Gel caps uses as needed     No current facility-administered medications on file prior to visit.    No Known Allergies   Assessment/Plan:  1. CHF - Blood pressure in clinic today a little above goal of <130/80. However all home blood pressure readings were at the lower end of goal. His home BP machine was compared to clinic cuff. Found to be pretty accurate for systolic, ran about 10 pt high for diastolic. Appears no room to adjust any meds. Continue Entresto 24/26mg  twice a day, Farxiga 10mg  daily, carvedilol 12.5mg  twice a day. Follow up with Dr. as scheduled.  Thank you,  Shari Prows, Pharm.D, BCPS, CPP [facility]  [address] [place], [place]  Phone: [phone]; Fax: [phone]

## 2021-02-20 NOTE — Patient Instructions (Addendum)
Continue Entresto 24/26mg  twice a day, Farxiga 10mg  daily and carvedilol 12.5mg  twice a day  Call with any questions [phone]  Follow up with Dr. [phone] and Dr. Shari Prows as needed  Call me if BP is <90/60 consisently

## 2021-02-26 ENCOUNTER — Other Ambulatory Visit (HOSPITAL_COMMUNITY): Payer: Medicare HMO

## 2021-03-11 ENCOUNTER — Other Ambulatory Visit: Payer: Self-pay | Admitting: *Deleted

## 2021-03-11 DIAGNOSIS — R7989 Other specified abnormal findings of blood chemistry: Secondary | ICD-10-CM

## 2021-03-11 DIAGNOSIS — I255 Ischemic cardiomyopathy: Secondary | ICD-10-CM

## 2021-03-11 DIAGNOSIS — Z951 Presence of aortocoronary bypass graft: Secondary | ICD-10-CM

## 2021-03-11 DIAGNOSIS — Z79899 Other long term (current) drug therapy: Secondary | ICD-10-CM

## 2021-03-11 DIAGNOSIS — I5022 Chronic systolic (congestive) heart failure: Secondary | ICD-10-CM

## 2021-03-11 DIAGNOSIS — I25810 Atherosclerosis of coronary artery bypass graft(s) without angina pectoris: Secondary | ICD-10-CM

## 2021-03-11 MED ORDER — ENTRESTO 24-26 MG PO TABS: 1.0000 | ORAL_TABLET | Freq: Two times a day (BID) | ORAL | 1 refills | Status: DC

## 2021-04-02 DIAGNOSIS — Z87442 Personal history of urinary calculi: Secondary | ICD-10-CM | POA: Diagnosis not present

## 2021-04-02 DIAGNOSIS — N179 Acute kidney failure, unspecified: Secondary | ICD-10-CM | POA: Diagnosis not present

## 2021-04-02 DIAGNOSIS — N183 Chronic kidney disease, stage 3 unspecified: Secondary | ICD-10-CM | POA: Diagnosis not present

## 2021-04-02 DIAGNOSIS — E785 Hyperlipidemia, unspecified: Secondary | ICD-10-CM | POA: Diagnosis not present

## 2021-04-02 DIAGNOSIS — I129 Hypertensive chronic kidney disease with stage 1 through stage 4 chronic kidney disease, or unspecified chronic kidney disease: Secondary | ICD-10-CM | POA: Diagnosis not present

## 2021-04-02 DIAGNOSIS — I255 Ischemic cardiomyopathy: Secondary | ICD-10-CM | POA: Diagnosis not present

## 2021-04-07 DIAGNOSIS — Z23 Encounter for immunization: Secondary | ICD-10-CM | POA: Diagnosis not present

## 2021-05-06 ENCOUNTER — Ambulatory Visit (HOSPITAL_COMMUNITY): Payer: Medicare HMO

## 2021-05-08 ENCOUNTER — Telehealth: Payer: Self-pay | Admitting: Internal Medicine

## 2021-05-08 NOTE — Telephone Encounter (Signed)
Spoke with pt who continues to complain of acid reflux.  Dr Shari Prows prescribed Nexium at pt's 09/19/2020 visit.  Pt is requesting a referral to GI.  Provided pt with [facility] physician referral phone number of [phone] and encouraged him to call.   Pt advised Dr Graciela Husbands is out of the office until 05/19/2021 and will forward request to Dr Shari Prows for further recommendation.  Pt verbalizes understanding and agrees with current plan.

## 2021-05-08 NOTE — Telephone Encounter (Signed)
Patient would like to be referred to a gastroenterologist.

## 2021-06-09 ENCOUNTER — Other Ambulatory Visit (HOSPITAL_COMMUNITY): Payer: Medicare HMO

## 2021-06-09 ENCOUNTER — Telehealth (HOSPITAL_COMMUNITY): Payer: Self-pay | Admitting: Internal Medicine

## 2021-06-09 NOTE — Telephone Encounter (Signed)
Patient called and cancelled echocardiogram for the reason below:  06/06/2021 4:58 PM [id number], Michael Banks  Cancel Rsn: Patient (wants to see his doctor first.)  Order will be removed from the ECHO WQ and when patient calls back to reschedule we will reinstate the order. Thank you

## 2021-06-19 ENCOUNTER — Ambulatory Visit: Payer: Medicare HMO | Admitting: Internal Medicine

## 2021-06-24 ENCOUNTER — Encounter: Payer: Self-pay | Admitting: Vascular Surgery

## 2021-06-24 ENCOUNTER — Other Ambulatory Visit: Payer: Self-pay

## 2021-06-24 ENCOUNTER — Ambulatory Visit (HOSPITAL_COMMUNITY)
Admission: RE | Admit: 2021-06-24 | Discharge: 2021-06-24 | Disposition: A | Discharge status: Home / Self Care | Payer: Medicare HMO | Source: Ambulatory Visit | Attending: Vascular Surgery | Admitting: Vascular Surgery

## 2021-06-24 ENCOUNTER — Ambulatory Visit (INDEPENDENT_AMBULATORY_CARE_PROVIDER_SITE_OTHER): Payer: Medicare HMO | Admitting: Vascular Surgery

## 2021-06-24 VITALS — BP 127/67 | HR 54 | Temp 97.8°F | Resp 18 | Ht 69.0 in | Wt 176.0 lb

## 2021-06-24 DIAGNOSIS — I7143 Infrarenal abdominal aortic aneurysm, without rupture: Secondary | ICD-10-CM

## 2021-06-24 DIAGNOSIS — I714 Abdominal aortic aneurysm, without rupture, unspecified: Secondary | ICD-10-CM | POA: Insufficient documentation

## 2021-06-24 NOTE — Progress Notes (Signed)
Patient name: Michael Banks MRN: [id number] DOB: 1950/07/14 Sex: male  REASON FOR CONSULT: 6 month follow-up for 5.1 cm AAA  HPI: Michael Banks is a 71 y.o. male, with history of coronary artery disease status post CABG and mitral valve repair, hypertension, hyperlipidemia, tobacco abuse, CHF with a EF of 25 to 30% presents for 6 month follow-up of known abdominal aortic aneurysm.  This was initially discovered by his urologist.  He had a stone study 02/27/2019 with noted 4.6 cm infrarenal abdominal aortic aneurysm.  His aneurysm last measured 5.1 cm approximately 6 months ago.  He reports no changes to his health over the last 6 months.  His reflux is better controlled.  No abdominal or back pain.  Past Medical History:  Diagnosis Date   Arthritis    Diabetes (HCC)    Dilated cardiomyopathy (HCC)    Gallstones    Hydronephrosis    Hyperglycemia    Hypertension    Kidney stone    UTI (urinary tract infection)     Past Surgical History:  Procedure Laterality Date   CORONARY ARTERY BYPASS GRAFT N/A 05/13/2018   Procedure: CORONARY ARTERY BYPASS GRAFTING (CABG) times _five__ using left internal mammary artery and right  greater saphenous vein harvested endoscopically.;  Surgeon: Loreli Slot, MD;  Location: [facility] OR;  Service: Open Heart Surgery;  Laterality: N/A;   KNEE ARTHROSCOPY     MITRAL VALVE REPAIR N/A 05/13/2018   Procedure: Mitral Valve Repair;  Surgeon: Loreli Slot, MD;  Location: [facility] OR;  Service: Open Heart Surgery;  Laterality: N/A;   NOSE SURGERY     RIGHT/LEFT HEART CATH AND CORONARY ANGIOGRAPHY N/A 05/05/2018   Procedure: RIGHT/LEFT HEART CATH AND CORONARY ANGIOGRAPHY;  Surgeon: Marykay Lex, MD;  Location: [facility] INVASIVE CV LAB;  Service: Cardiovascular;  Laterality: N/A;   TEE WITHOUT CARDIOVERSION N/A 05/13/2018   Procedure: TRANSESOPHAGEAL ECHOCARDIOGRAM (TEE);  Surgeon: Loreli Slot, MD;  Location: [facility] OR;  Service: Open Heart Surgery;   Laterality: N/A;   ULTRASOUND GUIDANCE FOR VASCULAR ACCESS  05/05/2018   Procedure: Ultrasound Guidance For Vascular Access;  Surgeon: Marykay Lex, MD;  Location: [facility] INVASIVE CV LAB;  Service: Cardiovascular;;    Family History  Problem Relation Age of Onset   CVA Father        died of "cerebral hemorrhage"   CAD Brother        died of "heart attack" at age 16    SOCIAL HISTORY: Social History   Socioeconomic History   Marital status: Single    Spouse name: Not on file   Number of children: Not on file   Years of education: Not on file   Highest education level: Not on file  Occupational History   Not on file  Tobacco Use   Smoking status: Former    Types: Cigarettes    Quit date: 2018    Years since quitting: 5.0   Smokeless tobacco: Never  Vaping Use   Vaping Use: Never used  Substance and Sexual Activity   Alcohol use: Never   Drug use: Never   Sexual activity: Not on file  Other Topics Concern   Not on file  Social History Narrative   Not on file   Social Determinants of Health   Financial Resource Strain: Not on file  Food Insecurity: Not on file  Transportation Needs: Not on file  Physical Activity: Not on file  Stress: Not on file  Social  Connections: Not on file  Intimate Partner Violence: Not on file    No Known Allergies  Current Outpatient Medications  Medication Sig Dispense Refill   aspirin EC 81 MG EC tablet Take 1 tablet (81 mg total) by mouth daily.     atorvastatin (LIPITOR) 80 MG tablet TAKE 1 TABLET BY MOUTH EVERY DAY AT 6 PM 90 tablet 2   carvedilol (COREG) 12.5 MG tablet Take 0.5 tablets (6.25 mg total) by mouth 2 (two) times daily. 180 tablet 3   dapagliflozin propanediol (FARXIGA) 10 MG TABS tablet Take 1 tablet (10 mg total) by mouth daily before breakfast. 90 tablet 1   esomeprazole (NEXIUM) 40 MG capsule Take 1 capsule (40 mg total) by mouth at bedtime. 90 capsule 1   sacubitril-valsartan (ENTRESTO) 24-26 MG Take 1 tablet by  mouth 2 (two) times daily. 180 tablet 1   Simethicone (GAS-X EXTRA STRENGTH PO) Take by mouth. Gel caps uses as needed     No current facility-administered medications for this visit.    REVIEW OF SYSTEMS:  [X]  denotes positive finding, [ ]  denotes negative finding Cardiac  Comments:  Chest pain or chest pressure:    Shortness of breath upon exertion:    Short of breath when lying flat:    Irregular heart rhythm:        Vascular    Pain in calf, thigh, or hip brought on by ambulation:    Pain in feet at night that wakes you up from your sleep:     Blood clot in your veins:    Leg swelling:         Pulmonary    Oxygen at home:    Productive cough:     Wheezing:         Neurologic    Sudden weakness in arms or legs:     Sudden numbness in arms or legs:     Sudden onset of difficulty speaking or slurred speech:    Temporary loss of vision in one eye:     Problems with dizziness:         Gastrointestinal    Blood in stool:     Vomited blood:         Genitourinary    Burning when urinating:     Blood in urine:        Psychiatric    Major depression:         Hematologic    Bleeding problems:    Problems with blood clotting too easily:        Skin    Rashes or ulcers:        Constitutional    Fever or chills:      PHYSICAL EXAM: Vitals:   06/24/21 0926  BP: 127/67  Pulse: (!) 54  Resp: 18  Temp: 97.8 F (36.6 C)  TempSrc: Temporal  SpO2: 97%  Weight: 176 lb (79.8 kg)  Height: 5\' 9"  (1.753 m)    GENERAL: The patient is a well-nourished male, in no acute distress. The vital signs are documented above. CARDIAC: There is a regular rate and rhythm.  VASCULAR:  Palpable femoral pulses bilaterally Palpable dorsalis pedis pulses  PULMONARY: No respiratory distress. ABDOMEN: Soft and non-tender.  No pain with palpation of aneurysm. MUSCULOSKELETAL: There are no major deformities or cyanosis. NEUROLOGIC: No focal weakness or paresthesias are detected. SKIN:  There are no ulcers or rashes noted. PSYCHIATRIC: The patient has a normal affect.  DATA:   Repeat  AAA duplex today shows stable size of AAA around 5.05 cm with no significant interval change  Assessment/Plan:  71 year old male presents for 6 month follow-up of known 5.1 cm infrarenal abdominal aortic aneurysm.  I discussed with him that his aneurysm has shown no significant interval change over the last 6 months.  He is having no symptoms of his aneurysm and he had no pain today with palpation of the aneurysm.  Discussed current guidelines are repair greater than 5.5 cm in men.  We will arrange follow-up again in 6 months with me with AAA duplex for continued surveillance.  No indication for surgical repair at this time.   Cephus Shellinghristopher J. Adama Ferber, MD [facility] Office: [phone]

## 2021-06-25 ENCOUNTER — Other Ambulatory Visit: Payer: Self-pay

## 2021-06-25 DIAGNOSIS — I7143 Infrarenal abdominal aortic aneurysm, without rupture: Secondary | ICD-10-CM

## 2021-06-26 ENCOUNTER — Ambulatory Visit: Payer: Medicare HMO | Admitting: Student

## 2021-07-09 ENCOUNTER — Encounter: Payer: Self-pay | Admitting: Cardiology

## 2021-07-09 ENCOUNTER — Telehealth: Payer: Self-pay | Admitting: Cardiology

## 2021-07-09 DIAGNOSIS — I255 Ischemic cardiomyopathy: Secondary | ICD-10-CM | POA: Diagnosis not present

## 2021-07-09 DIAGNOSIS — K2950 Unspecified chronic gastritis without bleeding: Secondary | ICD-10-CM | POA: Diagnosis not present

## 2021-07-09 DIAGNOSIS — I480 Paroxysmal atrial fibrillation: Secondary | ICD-10-CM | POA: Diagnosis not present

## 2021-07-09 DIAGNOSIS — I10 Essential (primary) hypertension: Secondary | ICD-10-CM | POA: Diagnosis not present

## 2021-07-09 DIAGNOSIS — J069 Acute upper respiratory infection, unspecified: Secondary | ICD-10-CM | POA: Diagnosis not present

## 2021-07-09 DIAGNOSIS — I5022 Chronic systolic (congestive) heart failure: Secondary | ICD-10-CM | POA: Diagnosis not present

## 2021-07-09 DIAGNOSIS — Z7984 Long term (current) use of oral hypoglycemic drugs: Secondary | ICD-10-CM | POA: Diagnosis not present

## 2021-07-09 DIAGNOSIS — E1169 Type 2 diabetes mellitus with other specified complication: Secondary | ICD-10-CM | POA: Diagnosis not present

## 2021-07-09 NOTE — Telephone Encounter (Signed)
° °  Pt would like to ask Dr. Johney Frame if she can prescribe for his chronic runny nose. He said he already tried over the counter meds like, dayquil, nyquil, allergy meds and nothing is working.

## 2021-07-09 NOTE — Telephone Encounter (Signed)
Spoke with pt who complains of runny nose x 7 days.  He denies additional symptoms of fever, sore throat, cough or congestion.  He has tried multiple over the counter remedies without success.  Pt without current PCP but has scheduled appointment for next week with [facility].  Pt advised to also try OTC saline spray.  If unable to wait until his PCP appointment next week or develops additional symptoms may need to do to urgent care as Dr Johney Frame is not in the office today. Pt verbalizes understanding and agrees with current plan.

## 2021-07-11 ENCOUNTER — Other Ambulatory Visit: Payer: Self-pay | Admitting: *Deleted

## 2021-07-11 DIAGNOSIS — I255 Ischemic cardiomyopathy: Secondary | ICD-10-CM

## 2021-07-11 DIAGNOSIS — I5022 Chronic systolic (congestive) heart failure: Secondary | ICD-10-CM

## 2021-07-11 DIAGNOSIS — Z79899 Other long term (current) drug therapy: Secondary | ICD-10-CM

## 2021-07-11 DIAGNOSIS — Z951 Presence of aortocoronary bypass graft: Secondary | ICD-10-CM

## 2021-07-11 DIAGNOSIS — I25810 Atherosclerosis of coronary artery bypass graft(s) without angina pectoris: Secondary | ICD-10-CM

## 2021-07-11 DIAGNOSIS — R7989 Other specified abnormal findings of blood chemistry: Secondary | ICD-10-CM

## 2021-07-11 MED ORDER — DAPAGLIFLOZIN PROPANEDIOL 10 MG PO TABS: 10.0000 mg | ORAL_TABLET | Freq: Every day | ORAL | 1 refills | Status: DC

## 2021-07-17 ENCOUNTER — Ambulatory Visit: Payer: Self-pay | Admitting: Nurse Practitioner

## 2021-07-17 ENCOUNTER — Ambulatory Visit: Payer: Medicare HMO | Admitting: Cardiology

## 2021-07-19 NOTE — Progress Notes (Deleted)
Cardiology Office Note:    Date:  07/19/2021   ID:  Michael Banks, DOB 06/05/1951, MRN [id number]  PCP:  Kathyrn Lass   [place]  Cardiologist:  Ena Dawley, MD Advanced Practice Provider:  No care team member to display Electrophysiologist:  None   Referring MD: Freada Bergeron, MD    History of Present Illness:    Michael Banks is a 71 y.o. male with a hx of CAD s/p 5v CABG in 2019 with MVR, hypertension, HLD, tobacco abuse, and DMII who was previously followed by Dr. Meda Coffee who now returns to clinic for follow-up.  The patient was admitted 04/2018 with shortness of breath and orthopnea.  Echo showed severely reduced LV function ejection fraction 20 to 25% with moderate MR.  Cardiac catheterization revealed severe CAD and severe MR.  Mild cardio viability study revealed viability in 12 out of 17 segments.  He underwent CABG x5 and MV repair 05/13/2018.  Briefly had atrial fibrillation postop converted to normal sinus rhythm with amiodarone.   Saw Dr. Meda Coffee on 10/06/19 where he was doing very well. Was not able to start entresto due to cost. TTE 08/21/19 with LVEF 25-30%, G2DD, trivial MR with mean gradient 35mmHg. Noted to have AAA which is now monitored by Dr. Carlis Abbott. Currently measures 4.89cm; plan to intervene if greater than 5.5.  During visit on 09/19/20, we started him on spironolactone but Cr rose to 1.4. We stopped it and re-check his Cr the next week and it continued to rise to 1.9 despite stopping the medication. Given concern for possible fluid overload, the patient was brought back to clinic for further evaluation.  Was seen in clinic on 10/09/20. We checked BNP and it was elevated at 1053. We recommended increasing his diuretics, however, Cr continued to rise. We therefore referred to renal at that time. Ultimately, it was determined that his renal dysfunction was secondary to a kidney stone.  Was evaluated by Dr. Caryl Comes on 12/06/20 where he  had been off GDMT due to worsening renal function. He was resumed on entresto with goal to reassess LVEF once tolerating GDMT to determine if needs ICD. The patient has not started the medication yet.  Last seen in clinic on 01/2021 by Dr. Caryl Comes. He was resumed on coreg with plans to repeat TTE in 3 months to determine if needed ICD. Echo has not been performed yet.  Today, ***  Past Medical History:  Diagnosis Date   Arthritis    Diabetes ([place])    Dilated cardiomyopathy ([place])    Gallstones    Hydronephrosis    Hyperglycemia    Hypertension    Kidney stone    UTI (urinary tract infection)     Past Surgical History:  Procedure Laterality Date   CORONARY ARTERY BYPASS GRAFT N/A 05/13/2018   Procedure: CORONARY ARTERY BYPASS GRAFTING (CABG) times _five__ using left internal mammary artery and right  greater saphenous vein harvested endoscopically.;  Surgeon: Melrose Nakayama, MD;  Location: [facility];  Service: Open Heart Surgery;  Laterality: N/A;   KNEE ARTHROSCOPY     MITRAL VALVE REPAIR N/A 05/13/2018   Procedure: Mitral Valve Repair;  Surgeon: Melrose Nakayama, MD;  Location: [facility];  Service: Open Heart Surgery;  Laterality: N/A;   NOSE SURGERY     RIGHT/LEFT HEART CATH AND CORONARY ANGIOGRAPHY N/A 05/05/2018   Procedure: RIGHT/LEFT HEART CATH AND CORONARY ANGIOGRAPHY;  Surgeon: Leonie Man, MD;  Location: [facility];  Service: Cardiovascular;  Laterality: N/A;   TEE WITHOUT CARDIOVERSION N/A 05/13/2018   Procedure: TRANSESOPHAGEAL ECHOCARDIOGRAM (TEE);  Surgeon: Melrose Nakayama, MD;  Location: [facility];  Service: Open Heart Surgery;  Laterality: N/A;   ULTRASOUND GUIDANCE FOR VASCULAR ACCESS  05/05/2018   Procedure: Ultrasound Guidance For Vascular Access;  Surgeon: Leonie Man, MD;  Location: [facility];  Service: Cardiovascular;;    Current Medications: No outpatient medications have been marked as taking for the 07/22/21 encounter  (Appointment) with Freada Bergeron, MD.     Allergies:   Patient has no known allergies.   Social History   Socioeconomic History   Marital status: Single    Spouse name: Not on file   Number of children: Not on file   Years of education: Not on file   Highest education level: Not on file  Occupational History   Not on file  Tobacco Use   Smoking status: Former    Types: Cigarettes    Quit date: 2018    Years since quitting: 5.0   Smokeless tobacco: Never  Vaping Use   Vaping Use: Never used  Substance and Sexual Activity   Alcohol use: Never   Drug use: Never   Sexual activity: Not on file  Other Topics Concern   Not on file  Social History Narrative   Not on file   Social Determinants of Health   Financial Resource Strain: Not on file  Food Insecurity: Not on file  Transportation Needs: Not on file  Physical Activity: Not on file  Stress: Not on file  Social Connections: Not on file     Family History: The patient's family history includes CAD in his brother; CVA in his father.  ROS:   Please see the history of present illness.    Review of Systems  Constitutional:  Negative for chills and fever.  HENT:  Negative for hearing loss.   Eyes:  Negative for blurred vision.  Respiratory:  Negative for shortness of breath.   Cardiovascular:  Negative for chest pain, palpitations, orthopnea, claudication, leg swelling and PND.  Gastrointestinal:  Positive for heartburn. Negative for blood in stool and melena.  Genitourinary:  Negative for flank pain and hematuria.  Musculoskeletal:  Positive for joint pain and myalgias. Negative for falls.  Neurological:  Negative for dizziness and loss of consciousness.  Psychiatric/Behavioral:  Negative for substance abuse.    EKGs/Labs/Other Studies Reviewed:    The following studies were reviewed today: TTE 12/21/2020: IMPRESSIONS   1. Left ventricular ejection fraction, by estimation, is 30 to 35%. The  left  ventricle has moderately decreased function. The left ventricle has  no regional wall motion abnormalities. There is mild left ventricular  hypertrophy of the basal-septal  segment. Left ventricular diastolic parameters are indeterminate. Elevated  left ventricular end-diastolic pressure.   2. Right ventricular systolic function is normal. The right ventricular  size is normal. Tricuspid regurgitation signal is inadequate for assessing  PA pressure.   3. The mitral valve has been repaired/replaced. Trivial mitral valve  regurgitation. Mild mitral stenosis. The mean mitral valve gradient is 5.0  mmHg.   4. The aortic valve is normal in structure. Aortic valve regurgitation is  mild. No aortic stenosis is present.   TTE 08/21/19:  1. Left ventricular ejection fraction, by estimation, is 25 to 30%. The  left ventricle has severely decreased function. The left ventricle  demonstrates global hypokinesis. There is mild left ventricular  hypertrophy.  Left ventricular diastolic parameters   are consistent with Grade II diastolic dysfunction (pseudonormalization).  Elevated left atrial pressure. Patient refused Definity.   2. Right ventricular systolic function is mildly reduced. The right  ventricular size is mildly enlarged.   3. The mitral valve has been repaired/replaced. There is a 32 mm  prosthetic annuloplasty ring present in the mitral position. Trivial  regurgitation. Mean gradient 4 mmHg.   4. The aortic valve was not well visualized. Aortic valve regurgitation  is mild.   5. Compared to prior TTE on 01/31/19, no significant change in LV systolic  Function  Cath 0000000: ISCHEMIC CARDIOMYOPATHY Hemodynamic findings consistent with moderate pulmonary hypertension -secondary to pulmonary venous hypertension LV end diastolic pressure and pulmonary capillary wedge pressure are moderately elevated. There is severe (4+) mitral regurgitation. SEVERE MULTIVESSEL DISEASE Prox RCA to Mid  RCA lesion is 90% stenosed. Mid RCA to Dist RCA lesion is 100% stenosed with 100% stenosed side branch in Post Atrio. Prox LAD lesion is 99% stenosed. Mid LAD-1 lesion is 80% stenosed. Mid LAD-2 lesion is 90% stenosed. Dist LAD lesion is 85% stenosed. Prox Cx lesion is 95% stenosed. 2nd Mrg lesion is 90% stenosed with 90% stenosed side branch in Lat 2nd Mrg.   SUMMARY Severe multivessel CAD: Diffuse proximal RCA disease with mid 100% CTO filling with bridging collaterals and right-to-left collaterals, separate ostium circumflex with proximal 95% stenosis and bifurcation 90% stenosis of OM1, 99% mid LAD after very large septal perforator trunk (perfusing PDA) with bridging collaterals filling mid to distal LAD with a large diagonal branch and diffuse disease throughout the distal LAD. Moderate pulmonary hypertension likely secondary to pulmonary venous congestion and mitral regurgitation. At least moderate-severe if not severe mitral regurgitation (likely ischemic) Moderately elevated LVEDP and PCWP consistent with acute diastolic heart failure in conjunction with acute systolic heart failure from Ischemic Cardiomyopathy  At least mildly reduced cardiac output of 4.8, index 2.39.     RECOMMENDATION Return to nursing unit for TR band removal. Continue aggressive CHF management The patient would likely best benefit from CABG plus possible mitral valve repair. --We will consult CVTS Will place on IV heparin 8 hours after sheath removal Consider viability study as part of assessment for revascularization options. Percutaneous options would be limited and extremely high risk given his low output and multivessel disease.  Would likely need support with Impella Recommend Aspirin 81mg  daily for severe CAD.  Ultrasound 05/21/20: Abdominal Aorta: There is evidence of abnormal dilatation of the distal  Abdominal aorta. Previous diameter measurement was 4.6 cm obtained on  08/21/19 by CTA.    EKG: 10/09/20: EKG is not ordered today.  Recent Labs: 10/09/2020: NT-Pro BNP 1,053 01/20/2021: BUN 26; Creatinine, Ser 2.23; Potassium 4.6; Sodium 141   Recent Lipid Panel    Component Value Date/Time   CHOL 122 08/03/2018 0819   TRIG 76 08/03/2018 0819   HDL 46 08/03/2018 0819   CHOLHDL 2.7 08/03/2018 0819   CHOLHDL 4.2 05/04/2018 0433   VLDL 11 05/04/2018 0433   LDLCALC 61 08/03/2018 0819     Risk Assessment/Calculations:       Physical Exam:    VS:  There were no vitals taken for this visit.    Wt Readings from Last 3 Encounters:  06/24/21 176 lb (79.8 kg)  02/07/21 182 lb 3.2 oz (82.6 kg)  01/30/21 184 lb 12.8 oz (83.8 kg)     GEN:  Comfortable, NAD HEENT: Normal NECK: No JVD; No carotid bruits CARDIAC:  RRR, no murmurs RESPIRATORY:  Clear to auscultation without rales, wheezing or rhonchi  ABDOMEN: Soft, non-tender, non-distended MUSCULOSKELETAL:  No edema; No deformity  SKIN: Warm and dry NEUROLOGIC:  Alert and oriented x 3 PSYCHIATRIC:  Normal affect   ASSESSMENT:    No diagnosis found.  PLAN:    In order of problems listed above:  #Ischemic Cardiomyopathy: #Chronic Systolic HF: TTE [id number] with LVEF 25-30%-->30-35% in 11/2020, but was not on GDMT for >41months at that time.  Appears euvolemic today with no HF symptoms. Given reduced LVEF and improvement in renal function, will add GDMT as tolerated. -Continue coreg 3.125mg  BID -Continue entresto 24/26mg  BID -Continue farxiga 10mg  daily -Repeat TTE and if EF <35%, will rediscuss ICD (patient states he may not want to have this done in the future regardless of EF if it does not make him feel better)  #CAD s/p 5V CABG with MVR in 2019: No anginal symptoms. Does not appear grossly overloaded -Continue ASA 81mg  daily and lipitor 80mg  daily -Continue coreg 12.55mg  BID as above -Continue entresto 24/26mg  BID as above  #AAA: Monitored by Dr. Carlis Abbott. Measures 5.0cm on most recent ultrasound  06/2021. -Continue surveillance ultrasounds q87months per Dr. Carlis Abbott -Threshold to repair is 5.5cm unless rapid growth or symptoms -Continue aggressive blood pressure control -Continue ASA 81mg  daily and lipitor 80mg  daily -Follow-up with Dr. Carlis Abbott as scheduled  #CKD stage IIIa: -Follows with [facility]  #HTN: Controlled.  -Continue coreg 3.125mg  BID -Continue entresto 24/26mg  BID as above  #HLD: LDL at goal at 50. -Continue lipitor 80mg  daily  #DMII: Diet controlled. A1C 6.5. -Continue lifestyle modifications -Start farxiga as above  #GERD: -Encouraged him to start the nexium 40mg  daily  Medication Adjustments/Labs and Tests Ordered: Current medicines are reviewed at length with the patient today.  Concerns regarding medicines are outlined above.  No orders of the defined types were placed in this encounter.  No orders of the defined types were placed in this encounter.   There are no Patient Instructions on file for this visit.    Follow-up in 3 months.   Signed, Freada Bergeron, MD  07/19/2021 8:56 PM    [place]

## 2021-07-22 ENCOUNTER — Other Ambulatory Visit: Payer: Self-pay

## 2021-07-22 ENCOUNTER — Encounter: Payer: Self-pay | Admitting: Cardiology

## 2021-07-22 ENCOUNTER — Ambulatory Visit (INDEPENDENT_AMBULATORY_CARE_PROVIDER_SITE_OTHER): Payer: Medicare HMO | Admitting: Cardiology

## 2021-07-22 VITALS — BP 124/72 | HR 61 | Ht 69.0 in | Wt 177.6 lb

## 2021-07-22 DIAGNOSIS — Z79899 Other long term (current) drug therapy: Secondary | ICD-10-CM | POA: Diagnosis not present

## 2021-07-22 DIAGNOSIS — Z9889 Other specified postprocedural states: Secondary | ICD-10-CM | POA: Diagnosis not present

## 2021-07-22 DIAGNOSIS — R9430 Abnormal result of cardiovascular function study, unspecified: Secondary | ICD-10-CM

## 2021-07-22 DIAGNOSIS — I25118 Atherosclerotic heart disease of native coronary artery with other forms of angina pectoris: Secondary | ICD-10-CM

## 2021-07-22 DIAGNOSIS — I7143 Infrarenal abdominal aortic aneurysm, without rupture: Secondary | ICD-10-CM | POA: Diagnosis not present

## 2021-07-22 DIAGNOSIS — I5022 Chronic systolic (congestive) heart failure: Secondary | ICD-10-CM | POA: Diagnosis not present

## 2021-07-22 DIAGNOSIS — Z951 Presence of aortocoronary bypass graft: Secondary | ICD-10-CM

## 2021-07-22 DIAGNOSIS — E782 Mixed hyperlipidemia: Secondary | ICD-10-CM

## 2021-07-22 DIAGNOSIS — I10 Essential (primary) hypertension: Secondary | ICD-10-CM | POA: Diagnosis not present

## 2021-07-22 DIAGNOSIS — E119 Type 2 diabetes mellitus without complications: Secondary | ICD-10-CM | POA: Diagnosis not present

## 2021-07-22 MED ORDER — ESOMEPRAZOLE MAGNESIUM 40 MG PO CPDR: 40.0000 mg | DELAYED_RELEASE_CAPSULE | Freq: Every day | ORAL | 3 refills | Status: DC

## 2021-07-22 NOTE — Patient Instructions (Signed)
Medication Instructions:   Your physician recommends that you continue on your current medications as directed. Please refer to the Current Medication list given to you today.  *If you need a refill on your cardiac medications before your next appointment, please call your pharmacy*  Testing/Procedures:  Your physician has requested that you have an LIMITED echocardiogram. Echocardiography is a painless test that uses sound waves to create images of your heart. It provides your doctor with information about the size and shape of your heart and how well your hearts chambers and valves are working. This procedure takes approximately one hour. There are no restrictions for this procedure.  SCHEDULE TO BE DONE IN 3 MONTHS PER DR. Shari Prows   Follow-Up: At [facility], you and your health needs are our priority.  As part of our continuing mission to provide you with exceptional heart care, we have created designated Provider Care Teams.  These Care Teams include your primary Cardiologist (physician) and Advanced Practice Providers (APPs -  Physician Assistants and Nurse Practitioners) who all work together to provide you with the care you need, when you need it.  We recommend signing up for the patient portal called "MyChart".  Sign up information is provided on this After Visit Summary.  MyChart is used to connect with patients for Virtual Visits (Telemedicine).  Patients are able to view lab/test results, encounter notes, upcoming appointments, etc.  Non-urgent messages can be sent to your provider as well.   To learn more about what you can do with MyChart, go to [email or web address].    Your next appointment:   6 month(s)  The format for your next appointment:   In Person  Provider:   DR. Shari Prows

## 2021-07-22 NOTE — Progress Notes (Signed)
Cardiology Office Note:    Date:  07/22/2021   ID:  BRANDT SHELLMAN, DOB Dec 26, 1950, MRN [id number]  PCP:  Kathyrn Lass   [place]  Cardiologist:  Ena Dawley, MD Advanced Practice Provider:  No care team member to display Electrophysiologist:  None   Referring MD: Freada Bergeron, MD    History of Present Illness:    Michael Banks is a 71 y.o. male with a hx of CAD s/p 5v CABG in 2019 with MVR, hypertension, HLD, tobacco abuse, and DMII who was previously followed by Dr. Meda Coffee who now returns to clinic for follow-up.  The patient was admitted 04/2018 with shortness of breath and orthopnea.  Echo showed severely reduced LV function ejection fraction 20 to 25% with moderate MR.  Cardiac catheterization revealed severe CAD and severe MR.  Mild cardio viability study revealed viability in 12 out of 17 segments.  He underwent CABG x5 and MV repair 05/13/2018.  Briefly had atrial fibrillation postop converted to normal sinus rhythm with amiodarone.   Saw Dr. Meda Coffee on 10/06/19 where he was doing very well. Was not able to start entresto due to cost. TTE 08/21/19 with LVEF 25-30%, G2DD, trivial MR with mean gradient 58mmHg. Noted to have AAA which is now monitored by Dr. Carlis Abbott. Currently measures 4.89cm; plan to intervene if greater than 5.5.  During visit on 09/19/20, we started him on spironolactone but Cr rose to 1.4. We stopped it and re-check his Cr the next week and it continued to rise to 1.9 despite stopping the medication. Given concern for possible fluid overload, the patient was brought back to clinic for further evaluation.  Was seen in clinic on 10/09/20. We checked BNP and it was elevated at 1053. We recommended increasing his diuretics, however, Cr continued to rise. We therefore referred to renal at that time. Ultimately, it was determined that his renal dysfunction was secondary to a kidney stone.  Was evaluated by Dr. Caryl Comes on 12/06/20 where he  had been off GDMT due to worsening renal function. He was resumed on entresto with goal to reassess LVEF once tolerating GDMT to determine if needs ICD. The patient has not started the medication yet.  Last seen in clinic on 01/2021 by Dr. Caryl Comes. He was resumed on coreg with plans to repeat TTE in 3 months to determine if needed ICD. Echo has not been performed yet.  Today, he is doing well. He is compliant and tolerating his medications. Lightheadedness resolved with decreasing his coreg. His aneurysm and kidney function is stable. However, he did pass a kidney stone last night. He records his blood pressure at home. This morning, his blood pressure was [id number] systolic.   Last month, he noticed a runny nose. He was prescribed Flonase which has improved the congestion. He endorses acid reflux which is improved with Gas-X.  The patient denies chest pressure, dyspnea at rest or with exertion, palpitations, PND, orthopnea, or leg swelling. Denies cough, fever, chills. Denies nausea, vomiting. Denies syncope or presyncope. Denies dizziness or lightheadedness. Denies snoring.  Past Medical History:  Diagnosis Date   Arthritis    Diabetes ([place])    Dilated cardiomyopathy ([place])    Gallstones    Hydronephrosis    Hyperglycemia    Hypertension    Kidney stone    UTI (urinary tract infection)     Past Surgical History:  Procedure Laterality Date   CORONARY ARTERY BYPASS GRAFT N/A 05/13/2018   Procedure: CORONARY ARTERY  BYPASS GRAFTING (CABG) times _five__ using left internal mammary artery and right  greater saphenous vein harvested endoscopically.;  Surgeon: Melrose Nakayama, MD;  Location: [facility];  Service: Open Heart Surgery;  Laterality: N/A;   KNEE ARTHROSCOPY     MITRAL VALVE REPAIR N/A 05/13/2018   Procedure: Mitral Valve Repair;  Surgeon: Melrose Nakayama, MD;  Location: [facility];  Service: Open Heart Surgery;  Laterality: N/A;   NOSE SURGERY     RIGHT/LEFT HEART CATH AND CORONARY  ANGIOGRAPHY N/A 05/05/2018   Procedure: RIGHT/LEFT HEART CATH AND CORONARY ANGIOGRAPHY;  Surgeon: Leonie Man, MD;  Location: [facility];  Service: Cardiovascular;  Laterality: N/A;   TEE WITHOUT CARDIOVERSION N/A 05/13/2018   Procedure: TRANSESOPHAGEAL ECHOCARDIOGRAM (TEE);  Surgeon: Melrose Nakayama, MD;  Location: [facility];  Service: Open Heart Surgery;  Laterality: N/A;   ULTRASOUND GUIDANCE FOR VASCULAR ACCESS  05/05/2018   Procedure: Ultrasound Guidance For Vascular Access;  Surgeon: Leonie Man, MD;  Location: [facility];  Service: Cardiovascular;;    Current Medications: Current Meds  Medication Sig   aspirin EC 81 MG EC tablet Take 1 tablet (81 mg total) by mouth daily.   atorvastatin (LIPITOR) 80 MG tablet TAKE 1 TABLET BY MOUTH EVERY DAY AT 6 PM   carvedilol (COREG) 6.25 MG tablet Take 3.125 mg by mouth 2 (two) times daily with a meal.   dapagliflozin propanediol (FARXIGA) 10 MG TABS tablet Take 1 tablet (10 mg total) by mouth daily before breakfast.   fluticasone (FLONASE) 50 MCG/ACT nasal spray Place 1 spray into both nostrils daily.   sacubitril-valsartan (ENTRESTO) 24-26 MG Take 1 tablet by mouth 2 (two) times daily.   Simethicone (GAS-X EXTRA STRENGTH PO) Take by mouth. Gel caps uses as needed   [DISCONTINUED] esomeprazole (NEXIUM) 40 MG capsule Take 1 capsule (40 mg total) by mouth at bedtime.     Allergies:   Patient has no known allergies.   Social History   Socioeconomic History   Marital status: Single    Spouse name: Not on file   Number of children: Not on file   Years of education: Not on file   Highest education level: Not on file  Occupational History   Not on file  Tobacco Use   Smoking status: Former    Types: Cigarettes    Quit date: 2018    Years since quitting: 5.0   Smokeless tobacco: Never  Vaping Use   Vaping Use: Never used  Substance and Sexual Activity   Alcohol use: Never   Drug use: Never   Sexual activity:  Not on file  Other Topics Concern   Not on file  Social History Narrative   Not on file   Social Determinants of Health   Financial Resource Strain: Not on file  Food Insecurity: Not on file  Transportation Needs: Not on file  Physical Activity: Not on file  Stress: Not on file  Social Connections: Not on file     Family History: The patient's family history includes CAD in his brother; CVA in his father.  ROS:   Please see the history of present illness.    Review of Systems  Constitutional:  Negative for chills and fever.  HENT:  Negative for hearing loss.   Eyes:  Negative for blurred vision.  Respiratory:  Negative for shortness of breath.   Cardiovascular:  Negative for chest pain, palpitations, orthopnea, claudication, leg swelling and PND.  Gastrointestinal:  Positive for heartburn. Negative for nausea.  Genitourinary:  Negative for flank pain and hematuria.  Musculoskeletal:  Negative for falls, joint pain and myalgias.  Skin:  Negative for itching and rash.  Neurological:  Negative for dizziness and loss of consciousness.  Endo/Heme/Allergies:  Does not bruise/bleed easily.  Psychiatric/Behavioral:  Negative for substance abuse.    EKGs/Labs/Other Studies Reviewed:    The following studies were reviewed today: Monitor 12/31/20 Patch Wear Time:  6 days and 20 hours   Patient had a min HR of 47 bpm, max HR of 72 bpm, and avg HR of 56 bpm. Predominant underlying rhythm was Sinus Rhythm. Idioventricular Rhythm was present. Isolated SVEs were rare (<1.0%), and no SVE Couplets or SVE Triplets were present.  Isolated VEs were occasional (1.1%, 5776), and no VE C   recommendations  1) insufficient ventricular ectopy to explain cardiomyopathy  2) impressive chronotropic incompetence 3) prob appropriate to proceed with dual chamber ICD  TTE 11/22/20: IMPRESSIONS   1. Left ventricular ejection fraction, by estimation, is 30 to 35%. The  left ventricle has moderately  decreased function. The left ventricle has  no regional wall motion abnormalities. There is mild left ventricular  hypertrophy of the basal-septal  segment. Left ventricular diastolic parameters are indeterminate. Elevated  left ventricular end-diastolic pressure.   2. Right ventricular systolic function is normal. The right ventricular  size is normal. Tricuspid regurgitation signal is inadequate for assessing  PA pressure.   3. The mitral valve has been repaired/replaced. Trivial mitral valve  regurgitation. Mild mitral stenosis. The mean mitral valve gradient is 5.0  mmHg.   4. The aortic valve is normal in structure. Aortic valve regurgitation is  mild. No aortic stenosis is present.   TTE 08/21/19:  1. Left ventricular ejection fraction, by estimation, is 25 to 30%. The  left ventricle has severely decreased function. The left ventricle  demonstrates global hypokinesis. There is mild left ventricular  hypertrophy. Left ventricular diastolic parameters   are consistent with Grade II diastolic dysfunction (pseudonormalization).  Elevated left atrial pressure. Patient refused Definity.   2. Right ventricular systolic function is mildly reduced. The right  ventricular size is mildly enlarged.   3. The mitral valve has been repaired/replaced. There is a 32 mm  prosthetic annuloplasty ring present in the mitral position. Trivial  regurgitation. Mean gradient 4 mmHg.   4. The aortic valve was not well visualized. Aortic valve regurgitation  is mild.   5. Compared to prior TTE on 01/31/19, no significant change in LV systolic  Function  Cath 0000000: ISCHEMIC CARDIOMYOPATHY Hemodynamic findings consistent with moderate pulmonary hypertension -secondary to pulmonary venous hypertension LV end diastolic pressure and pulmonary capillary wedge pressure are moderately elevated. There is severe (4+) mitral regurgitation. SEVERE MULTIVESSEL DISEASE Prox RCA to Mid RCA lesion is 90%  stenosed. Mid RCA to Dist RCA lesion is 100% stenosed with 100% stenosed side branch in Post Atrio. Prox LAD lesion is 99% stenosed. Mid LAD-1 lesion is 80% stenosed. Mid LAD-2 lesion is 90% stenosed. Dist LAD lesion is 85% stenosed. Prox Cx lesion is 95% stenosed. 2nd Mrg lesion is 90% stenosed with 90% stenosed side branch in Lat 2nd Mrg.   SUMMARY Severe multivessel CAD: Diffuse proximal RCA disease with mid 100% CTO filling with bridging collaterals and right-to-left collaterals, separate ostium circumflex with proximal 95% stenosis and bifurcation 90% stenosis of OM1, 99% mid LAD after very large septal perforator trunk (perfusing PDA) with bridging collaterals filling mid to distal  LAD with a large diagonal branch and diffuse disease throughout the distal LAD. Moderate pulmonary hypertension likely secondary to pulmonary venous congestion and mitral regurgitation. At least moderate-severe if not severe mitral regurgitation (likely ischemic) Moderately elevated LVEDP and PCWP consistent with acute diastolic heart failure in conjunction with acute systolic heart failure from Ischemic Cardiomyopathy  At least mildly reduced cardiac output of 4.8, index 2.39.     RECOMMENDATION Return to nursing unit for TR band removal. Continue aggressive CHF management The patient would likely best benefit from CABG plus possible mitral valve repair. --We will consult CVTS Will place on IV heparin 8 hours after sheath removal Consider viability study as part of assessment for revascularization options. Percutaneous options would be limited and extremely high risk given his low output and multivessel disease.  Would likely need support with Impella Recommend Aspirin 81mg  daily for severe CAD.  Ultrasound 05/21/20: Abdominal Aorta: There is evidence of abnormal dilatation of the distal  Abdominal aorta. Previous diameter measurement was 4.6 cm obtained on  08/21/19 by CTA.   EKG: 07/22/21: Sinus rhythm,  rate 61 bpm  Recent Labs: 10/09/2020: NT-Pro BNP 1,053 01/20/2021: BUN 26; Creatinine, Ser 2.23; Potassium 4.6; Sodium 141   Recent Lipid Panel    Component Value Date/Time   CHOL 122 08/03/2018 0819   TRIG 76 08/03/2018 0819   HDL 46 08/03/2018 0819   CHOLHDL 2.7 08/03/2018 0819   CHOLHDL 4.2 05/04/2018 0433   VLDL 11 05/04/2018 0433   LDLCALC 61 08/03/2018 0819     Risk Assessment/Calculations:       Physical Exam:    VS:  BP 124/72    Pulse 61    Ht 5\' 9"  (1.753 m)    Wt 177 lb 9.6 oz (80.6 kg)    SpO2 97%    BMI 26.23 kg/m     Wt Readings from Last 3 Encounters:  07/22/21 177 lb 9.6 oz (80.6 kg)  06/24/21 176 lb (79.8 kg)  02/07/21 182 lb 3.2 oz (82.6 kg)    GEN:  Well nourished, well developed in no acute distress HEENT: Normal NECK: No JVD; No carotid bruits LYMPHATICS: No lymphadenopathy CARDIAC: RRR, no murmurs, rubs, gallops RESPIRATORY:  Clear to auscultation without rales, wheezing or rhonchi  ABDOMEN: Soft, non-tender, non-distended MUSCULOSKELETAL:  No edema; No deformity  SKIN: Warm and dry NEUROLOGIC:  Alert and oriented x 3 PSYCHIATRIC:  Normal affect   ASSESSMENT:    1. Chronic systolic heart failure ([place])   2. Low left ventricular ejection fraction   3. Coronary artery disease of native artery of native heart with stable angina pectoris ([place])   4. Medication management   5. S/P CABG x 5   6. Infrarenal abdominal aortic aneurysm (AAA) without rupture   7. S/P mitral valve repair   8. Primary hypertension   9. Mixed hyperlipidemia   10. Diabetes mellitus with coincident hypertension ([place])     PLAN:    In order of problems listed above:  #Ischemic Cardiomyopathy: #Chronic Systolic HF: TTE [id number] with LVEF 25-30%-->30-35% in 11/2020, but was not on GDMT for >93months at that time.  Appears euvolemic today with no HF symptoms. Given reduced LVEF and improvement in renal function, will add GDMT as tolerated. -Continue coreg 3.125mg   BID -Continue entresto 24/26mg  BID -Continue farxiga 10mg  daily -Repeat TTE and if EF <35%, will rediscuss ICD (patient would like to wait 20months to have this done; if EF<35%, may need to consider ICD)  #CAD s/p 5V  CABG with MVR in 2019: No anginal symptoms. Does not appear grossly overloaded -Continue ASA 81mg  daily and lipitor 80mg  daily -Continue coreg 3.125mg  BID -Continue entresto 24/26mg  BID as above  #AAA: Monitored by Dr. Carlis Abbott. Measures 5.0cm on most recent ultrasound 06/2021. -Continue surveillance ultrasounds q3months per Dr. Carlis Abbott -Threshold to repair is 5.5cm unless rapid growth or symptoms -Continue aggressive blood pressure control -Continue ASA 81mg  daily and lipitor 80mg  daily -Follow-up with Dr. Carlis Abbott as scheduled  #CKD stage IIIa: -Follows with [facility]  #HTN: Controlled.  -Continue coreg 3.125mg  BID -Continue entresto 24/26mg  BID as above  #HLD: LDL at goal at 50. -Continue lipitor 80mg  daily  #DMII: Diet controlled. A1C 6.5. -Continue lifestyle modifications -Continue farxiga as above  #GERD: Significantly improved with nexium. -Continue nexium  Medication Adjustments/Labs and Tests Ordered: Current medicines are reviewed at length with the patient today.  Concerns regarding medicines are outlined above.  Orders Placed This Encounter  Procedures   EKG 12-Lead   ECHOCARDIOGRAM LIMITED   Meds ordered this encounter  Medications   esomeprazole (NEXIUM) 40 MG capsule    Sig: Take 1 capsule (40 mg total) by mouth at bedtime.    Dispense:  90 capsule    Refill:  3    Patient Instructions  Medication Instructions:   Your physician recommends that you continue on your current medications as directed. Please refer to the Current Medication list given to you today.  *If you need a refill on your cardiac medications before your next appointment, please call your pharmacy*  Testing/Procedures:  Your physician has requested that you have  an LIMITED echocardiogram. Echocardiography is a painless test that uses sound waves to create images of your heart. It provides your doctor with information about the size and shape of your heart and how well your hearts chambers and valves are working. This procedure takes approximately one hour. There are no restrictions for this procedure.  SCHEDULE TO BE DONE IN 3 MONTHS PER DR. Johney Frame   Follow-Up: At [facility], you and your health needs are our priority.  As part of our continuing mission to provide you with exceptional heart care, we have created designated Provider Care Teams.  These Care Teams include your primary Cardiologist (physician) and Advanced Practice Providers (APPs -  Physician Assistants and Nurse Practitioners) who all work together to provide you with the care you need, when you need it.  We recommend signing up for the patient portal called "MyChart".  Sign up information is provided on this After Visit Summary.  MyChart is used to connect with patients for Virtual Visits (Telemedicine).  Patients are able to view lab/test results, encounter notes, upcoming appointments, etc.  Non-urgent messages can be sent to your provider as well.   To learn more about what you can do with MyChart, go to [email or web address].    Your next appointment:   6 month(s)  The format for your next appointment:   In Person  Provider:   DR. Reece Leader as a scribe for Freada Bergeron, MD.,have documented all relevant documentation on the behalf of Freada Bergeron, MD,as directed by  Freada Bergeron, MD while in the presence of Freada Bergeron, MD.  I, Freada Bergeron, MD, have reviewed all documentation for this visit. The documentation on 07/22/21 for the exam, diagnosis, procedures, and orders are all accurate and complete.   Signed, Freada Bergeron, MD  07/22/2021 11:49 AM    [facility]

## 2021-08-05 ENCOUNTER — Other Ambulatory Visit: Payer: Self-pay | Admitting: *Deleted

## 2021-08-05 ENCOUNTER — Other Ambulatory Visit: Payer: Self-pay

## 2021-08-05 MED ORDER — CARVEDILOL 6.25 MG PO TABS: 3.1250 mg | ORAL_TABLET | Freq: Two times a day (BID) | ORAL | 3 refills | Status: DC

## 2021-08-05 MED ORDER — ATORVASTATIN CALCIUM 80 MG PO TABS: ORAL_TABLET | ORAL | 3 refills | Status: DC

## 2021-08-05 NOTE — Telephone Encounter (Signed)
Pt's medication was sent to pt's pharmacy as requested. Confirmation received.  °

## 2021-08-07 ENCOUNTER — Encounter: Payer: Self-pay | Admitting: Cardiology

## 2021-08-18 DIAGNOSIS — I255 Ischemic cardiomyopathy: Secondary | ICD-10-CM | POA: Diagnosis not present

## 2021-08-18 DIAGNOSIS — N179 Acute kidney failure, unspecified: Secondary | ICD-10-CM | POA: Diagnosis not present

## 2021-08-18 DIAGNOSIS — Z87442 Personal history of urinary calculi: Secondary | ICD-10-CM | POA: Diagnosis not present

## 2021-08-18 DIAGNOSIS — E785 Hyperlipidemia, unspecified: Secondary | ICD-10-CM | POA: Diagnosis not present

## 2021-08-18 DIAGNOSIS — N183 Chronic kidney disease, stage 3 unspecified: Secondary | ICD-10-CM | POA: Diagnosis not present

## 2021-08-18 DIAGNOSIS — I129 Hypertensive chronic kidney disease with stage 1 through stage 4 chronic kidney disease, or unspecified chronic kidney disease: Secondary | ICD-10-CM | POA: Diagnosis not present

## 2021-09-01 ENCOUNTER — Other Ambulatory Visit: Payer: Self-pay | Admitting: *Deleted

## 2021-09-01 DIAGNOSIS — I255 Ischemic cardiomyopathy: Secondary | ICD-10-CM

## 2021-09-01 DIAGNOSIS — R7989 Other specified abnormal findings of blood chemistry: Secondary | ICD-10-CM

## 2021-09-01 DIAGNOSIS — I25810 Atherosclerosis of coronary artery bypass graft(s) without angina pectoris: Secondary | ICD-10-CM

## 2021-09-01 DIAGNOSIS — Z79899 Other long term (current) drug therapy: Secondary | ICD-10-CM

## 2021-09-01 DIAGNOSIS — Z951 Presence of aortocoronary bypass graft: Secondary | ICD-10-CM

## 2021-09-01 DIAGNOSIS — I5022 Chronic systolic (congestive) heart failure: Secondary | ICD-10-CM

## 2021-09-01 MED ORDER — ENTRESTO 24-26 MG PO TABS: 1.0000 | ORAL_TABLET | Freq: Two times a day (BID) | ORAL | 1 refills | Status: DC

## 2021-09-22 DIAGNOSIS — Z23 Encounter for immunization: Secondary | ICD-10-CM | POA: Diagnosis not present

## 2021-09-22 DIAGNOSIS — K2950 Unspecified chronic gastritis without bleeding: Secondary | ICD-10-CM | POA: Diagnosis not present

## 2021-09-22 DIAGNOSIS — Z0000 Encounter for general adult medical examination without abnormal findings: Secondary | ICD-10-CM | POA: Diagnosis not present

## 2021-09-22 DIAGNOSIS — I25119 Atherosclerotic heart disease of native coronary artery with unspecified angina pectoris: Secondary | ICD-10-CM | POA: Diagnosis not present

## 2021-09-22 DIAGNOSIS — E1169 Type 2 diabetes mellitus with other specified complication: Secondary | ICD-10-CM | POA: Diagnosis not present

## 2021-09-22 DIAGNOSIS — Z1389 Encounter for screening for other disorder: Secondary | ICD-10-CM | POA: Diagnosis not present

## 2021-09-22 DIAGNOSIS — I5022 Chronic systolic (congestive) heart failure: Secondary | ICD-10-CM | POA: Diagnosis not present

## 2021-09-22 DIAGNOSIS — E785 Hyperlipidemia, unspecified: Secondary | ICD-10-CM | POA: Diagnosis not present

## 2021-09-22 DIAGNOSIS — I10 Essential (primary) hypertension: Secondary | ICD-10-CM | POA: Diagnosis not present

## 2021-09-22 DIAGNOSIS — E559 Vitamin D deficiency, unspecified: Secondary | ICD-10-CM | POA: Diagnosis not present

## 2021-09-22 DIAGNOSIS — N1832 Chronic kidney disease, stage 3b: Secondary | ICD-10-CM | POA: Diagnosis not present

## 2021-09-22 DIAGNOSIS — I255 Ischemic cardiomyopathy: Secondary | ICD-10-CM | POA: Diagnosis not present

## 2021-09-22 DIAGNOSIS — Z125 Encounter for screening for malignant neoplasm of prostate: Secondary | ICD-10-CM | POA: Diagnosis not present

## 2021-09-23 DIAGNOSIS — T148XXA Other injury of unspecified body region, initial encounter: Secondary | ICD-10-CM | POA: Diagnosis not present

## 2021-10-07 DIAGNOSIS — N390 Urinary tract infection, site not specified: Secondary | ICD-10-CM | POA: Diagnosis not present

## 2021-10-12 ENCOUNTER — Encounter: Payer: Self-pay | Admitting: Cardiology

## 2021-10-13 ENCOUNTER — Encounter: Payer: Self-pay | Admitting: Cardiology

## 2021-10-13 NOTE — Telephone Encounter (Signed)
See last previous mychart message from pt stating to cancel out and disregard last 2 messages he sent to Dr. Pemberton via mychart.  ?

## 2021-10-13 NOTE — Telephone Encounter (Signed)
See last previous mychart message from pt stating to cancel out and disregard last 2 messages he sent to Dr. Johney Frame via [facility].  ?

## 2021-10-20 ENCOUNTER — Ambulatory Visit (HOSPITAL_COMMUNITY): Payer: Medicare HMO | Attending: Internal Medicine

## 2021-10-20 ENCOUNTER — Encounter: Payer: Self-pay | Admitting: Cardiology

## 2021-10-20 ENCOUNTER — Other Ambulatory Visit: Payer: Self-pay | Admitting: Cardiology

## 2021-10-20 DIAGNOSIS — R9430 Abnormal result of cardiovascular function study, unspecified: Secondary | ICD-10-CM

## 2021-10-20 DIAGNOSIS — I25118 Atherosclerotic heart disease of native coronary artery with other forms of angina pectoris: Secondary | ICD-10-CM

## 2021-10-20 DIAGNOSIS — I5022 Chronic systolic (congestive) heart failure: Secondary | ICD-10-CM | POA: Diagnosis not present

## 2021-10-20 LAB — ECHOCARDIOGRAM COMPLETE
Area-P 1/2: 2.91 cm2
MV VTI: 1.15 cm2
S' Lateral: 2.9 cm

## 2021-10-20 MED ORDER — CARVEDILOL 3.125 MG PO TABS: 3.1250 mg | ORAL_TABLET | Freq: Two times a day (BID) | ORAL | 2 refills | Status: DC

## 2021-10-20 MED ORDER — PERFLUTREN LIPID MICROSPHERE
1.0000 mL | INTRAVENOUS | Status: AC | PRN
  Administered 2021-10-20: 2 mL via INTRAVENOUS

## 2022-01-01 ENCOUNTER — Other Ambulatory Visit: Payer: Self-pay

## 2022-01-01 DIAGNOSIS — I25810 Atherosclerosis of coronary artery bypass graft(s) without angina pectoris: Secondary | ICD-10-CM

## 2022-01-01 DIAGNOSIS — R7989 Other specified abnormal findings of blood chemistry: Secondary | ICD-10-CM

## 2022-01-01 DIAGNOSIS — Z79899 Other long term (current) drug therapy: Secondary | ICD-10-CM

## 2022-01-01 DIAGNOSIS — I255 Ischemic cardiomyopathy: Secondary | ICD-10-CM

## 2022-01-01 DIAGNOSIS — Z951 Presence of aortocoronary bypass graft: Secondary | ICD-10-CM

## 2022-01-01 DIAGNOSIS — I5022 Chronic systolic (congestive) heart failure: Secondary | ICD-10-CM

## 2022-01-01 MED ORDER — DAPAGLIFLOZIN PROPANEDIOL 10 MG PO TABS: 10.0000 mg | ORAL_TABLET | Freq: Every day | ORAL | 1 refills | Status: DC

## 2022-02-26 ENCOUNTER — Other Ambulatory Visit: Payer: Self-pay

## 2022-02-26 DIAGNOSIS — Z79899 Other long term (current) drug therapy: Secondary | ICD-10-CM

## 2022-02-26 DIAGNOSIS — I25810 Atherosclerosis of coronary artery bypass graft(s) without angina pectoris: Secondary | ICD-10-CM

## 2022-02-26 DIAGNOSIS — I255 Ischemic cardiomyopathy: Secondary | ICD-10-CM

## 2022-02-26 DIAGNOSIS — R7989 Other specified abnormal findings of blood chemistry: Secondary | ICD-10-CM

## 2022-02-26 DIAGNOSIS — I5022 Chronic systolic (congestive) heart failure: Secondary | ICD-10-CM

## 2022-02-26 DIAGNOSIS — Z951 Presence of aortocoronary bypass graft: Secondary | ICD-10-CM

## 2022-02-26 MED ORDER — ENTRESTO 24-26 MG PO TABS: 1.0000 | ORAL_TABLET | Freq: Two times a day (BID) | ORAL | 1 refills | Status: DC

## 2022-02-27 ENCOUNTER — Ambulatory Visit (HOSPITAL_COMMUNITY)
Admission: EM | Admit: 2022-02-27 | Discharge: 2022-02-27 | Disposition: A | Discharge status: Home / Self Care | Payer: Medicare HMO | Attending: Nurse Practitioner | Admitting: Nurse Practitioner

## 2022-02-27 DIAGNOSIS — H9313 Tinnitus, bilateral: Secondary | ICD-10-CM | POA: Diagnosis not present

## 2022-02-27 NOTE — ED Triage Notes (Signed)
Pt reports  bilateral ear pain for several days.

## 2022-02-27 NOTE — ED Provider Notes (Signed)
[facility]    CSN: [id number] Arrival date & time: 02/27/22  1415      History   Chief Complaint Chief Complaint  Patient presents with   Ear Pain    HPI Michael Banks is a 71 y.o. male.   HPI  He is complaining of bilateral ringing in both ears. He reports that this started on last night. He laid down and developed heartburn and ringing in his ears. He did use an antacid and this resolved the heartburn but the ringing continues. He did have to turn up his TV volume. He reports similar symptoms a few years ago and he has wax build up. He does not feel bad. He just wants his ears checked because the ringing is not comfortable. He denies use of cutips etc. Denies fever, chills, headache, dizziness, visual changes, shortness of breath, dyspnea on exertion, chest pain, nausea, vomiting or any edema.   Past Medical History:  Diagnosis Date   Arthritis    Diabetes (HCC)    Dilated cardiomyopathy (HCC)    Gallstones    Hydronephrosis    Hyperglycemia    Hypertension    Kidney stone    UTI (urinary tract infection)     Patient Active Problem List   Diagnosis Date Noted   Palpitations 02/06/2021   GERD (gastroesophageal reflux disease) 08/01/2019   Hyperglycemia    Ureteral colic 02/27/2019   Hydronephrosis of left kidney 02/27/2019   Urinary tract obstruction by kidney stone 02/27/2019   Acute lower UTI 02/27/2019   AAA (abdominal aortic aneurysm) without rupture (HCC) 02/27/2019   Ischemic cardiomyopathy 06/01/2018   S/P mitral valve repair 06/01/2018   Chronic systolic heart failure (HCC) 06/01/2018   Essential hypertension 06/01/2018   Hyperlipidemia 06/01/2018   Encounter for therapeutic drug monitoring 05/30/2018   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    New onset of congestive heart failure (HCC)    Mitral valve insufficiency    Demand ischemia (HCC)    Pulmonary edema 05/03/2018    Past Surgical History:   Procedure Laterality Date   CORONARY ARTERY BYPASS GRAFT N/A 05/13/2018   Procedure: CORONARY ARTERY BYPASS GRAFTING (CABG) times _five__ using left internal mammary artery and right  greater saphenous vein harvested endoscopically.;  Surgeon: Loreli Slot, MD;  Location: [facility] OR;  Service: Open Heart Surgery;  Laterality: N/A;   KNEE ARTHROSCOPY     MITRAL VALVE REPAIR N/A 05/13/2018   Procedure: Mitral Valve Repair;  Surgeon: Loreli Slot, MD;  Location: [facility] OR;  Service: Open Heart Surgery;  Laterality: N/A;   NOSE SURGERY     RIGHT/LEFT HEART CATH AND CORONARY ANGIOGRAPHY N/A 05/05/2018   Procedure: RIGHT/LEFT HEART CATH AND CORONARY ANGIOGRAPHY;  Surgeon: Marykay Lex, MD;  Location: [facility] INVASIVE CV LAB;  Service: Cardiovascular;  Laterality: N/A;   TEE WITHOUT CARDIOVERSION N/A 05/13/2018   Procedure: TRANSESOPHAGEAL ECHOCARDIOGRAM (TEE);  Surgeon: Loreli Slot, MD;  Location: [facility] OR;  Service: Open Heart Surgery;  Laterality: N/A;   ULTRASOUND GUIDANCE FOR VASCULAR ACCESS  05/05/2018   Procedure: Ultrasound Guidance For Vascular Access;  Surgeon: Marykay Lex, MD;  Location: [facility] INVASIVE CV LAB;  Service: Cardiovascular;;       Home Medications    Prior to Admission medications   Medication Sig Start Date End Date Taking? Authorizing Provider  aspirin EC 81 MG EC tablet Take 1 tablet (81 mg total) by mouth  daily. 05/20/18   Gershon Crane E, PA-C  atorvastatin (LIPITOR) 80 MG tablet TAKE 1 TABLET BY MOUTH EVERY DAY AT 6 PM 08/05/21   Meriam Sprague, MD  carvedilol (COREG) 3.125 MG tablet Take 1 tablet (3.125 mg total) by mouth 2 (two) times daily with a meal. 10/20/21   Meriam Sprague, MD  dapagliflozin propanediol (FARXIGA) 10 MG TABS tablet Take 1 tablet (10 mg total) by mouth daily before breakfast. 01/01/22   Meriam Sprague, MD  esomeprazole (NEXIUM) 40 MG capsule Take 1 capsule (40 mg total) by mouth at bedtime. 07/22/21   Meriam Sprague, MD  fluticasone (FLONASE) 50 MCG/ACT nasal spray Place 1 spray into both nostrils daily.    [provider]  sacubitril-valsartan (ENTRESTO) 24-26 MG Take 1 tablet by mouth 2 (two) times daily. 02/26/22   Meriam Sprague, MD  Simethicone (GAS-X EXTRA STRENGTH PO) Take by mouth. Gel caps uses as needed    [provider]    Family History Family History  Problem Relation Age of Onset   CVA Father        died of "cerebral hemorrhage"   CAD Brother        died of "heart attack" at age 61    Social History Social History   Tobacco Use   Smoking status: Former    Types: Cigarettes    Quit date: 2018    Years since quitting: 5.6   Smokeless tobacco: Never  Vaping Use   Vaping Use: Never used  Substance Use Topics   Alcohol use: Never   Drug use: Never     Allergies   Patient has no known allergies.   Review of Systems Review of Systems   Physical Exam Triage Vital Signs ED Triage Vitals [02/27/22 1537]  Enc Vitals Group     BP (!) 156/78     Pulse Rate 74     Resp 18     Temp 97.9 F (36.6 C)     Temp Source Oral     SpO2 95 %     Weight      Height      Head Circumference      Peak Flow      Pain Score      Pain Loc      Pain Edu?      Excl. in GC?    No data found.  Updated Vital Signs BP (!) 156/78 (BP Location: Left Arm)   Pulse 74   Temp 97.9 F (36.6 C) (Oral)   Resp 18   SpO2 95%   Visual Acuity Right Eye Distance:   Left Eye Distance:   Bilateral Distance:    Right Eye Near:   Left Eye Near:    Bilateral Near:     Physical Exam Constitutional:      General: He is not in acute distress.    Appearance: He is normal weight. He is not ill-appearing, toxic-appearing or diaphoretic.  HENT:     Head: Normocephalic and atraumatic.     Right Ear: Tympanic membrane normal.     Left Ear: There is impacted cerumen (small amount of cerumen visualized superficial).     Nose: Nose normal.     Mouth/Throat:      Mouth: Mucous membranes are dry.  Eyes:     Extraocular Movements: Extraocular movements intact.     Pupils: Pupils are equal, round, and reactive to light.  Cardiovascular:  Rate and Rhythm: Normal rate and regular rhythm.     Pulses: Normal pulses.     Heart sounds: Normal heart sounds.  Pulmonary:     Effort: Pulmonary effort is normal.     Comments: Diminished  Musculoskeletal:        General: Normal range of motion.     Cervical back: Normal range of motion.  Skin:    General: Skin is warm.     Capillary Refill: Capillary refill takes less than 2 seconds.  Neurological:     General: No focal deficit present.     Mental Status: He is alert and oriented to person, place, and time.  Psychiatric:        Mood and Affect: Mood normal.        Behavior: Behavior normal.      UC Treatments / Results  Labs (all labs ordered are listed, but only abnormal results are displayed) Labs Reviewed - No data to display  EKG   Radiology No results found.  Procedures Procedures (including critical care time)  Medications Ordered in UC Medications - No data to display  Initial Impression / Assessment and Plan / UC Course  I have reviewed the triage vital signs and the nursing notes.  Pertinent labs & imaging results that were available during my care of the patient were reviewed by me and considered in my medical decision making (see chart for details).     Tinnitus of both ears L>R Final Clinical Impressions(s) / UC Diagnoses   Final diagnoses:  Tinnitus of both ears     Discharge Instructions      Follow up with Primary Care  Call ENT if symptoms persist     ED Prescriptions   None    PDMP not reviewed this encounter.   Thad Ranger [place], NP 02/27/22 1700

## 2022-02-27 NOTE — Discharge Instructions (Addendum)
Tinnitus  Avoid Caffeine, nicotine, alcohol, Continue with current regimen; no changes  Follow up with Primary Care  Call ENT if symptoms persist

## 2022-03-25 ENCOUNTER — Telehealth: Payer: Self-pay

## 2022-03-25 NOTE — Patient Outreach (Signed)
  Care Coordination   03/25/2022 Name: Michael Banks MRN: [id number] DOB: [birth year]   Care Coordination Outreach Attempts:  An unsuccessful telephone outreach was attempted today to offer the patient information about available care coordination services as a benefit of their health plan.   Follow Up Plan:  Additional outreach attempts will be made to offer the patient care coordination information and services.   Encounter Outcome:  No Answer  Care Coordination Interventions Activated:  No   Care Coordination Interventions:  No, not indicated    [facility] [phone]

## 2022-03-26 ENCOUNTER — Telehealth: Payer: Self-pay

## 2022-03-26 NOTE — Patient Outreach (Signed)
  Care Coordination   Initial Visit Note   03/26/2022 Name: Michael Banks MRN: [id number] DOB: 11/14/1950  Michael Banks is a 71 y.o. year old male who sees Pcp, No for primary care.  Michael Banks returned the call. Reports primary care clinic is unaware of the care management services. Declined need for outreach.  What matters to the patients health and wellness today?  No Concerns Expressed. Patient Declined    Goals Addressed   None     SDOH assessments and interventions completed:  No     Care Coordination Interventions Activated:  No  Care Coordination Interventions:  No, not indicated   Follow up plan: No further intervention required.   Encounter Outcome:  Pt. Michael Banks Management [phone]

## 2022-03-31 DIAGNOSIS — Z23 Encounter for immunization: Secondary | ICD-10-CM | POA: Diagnosis not present

## 2022-03-31 DIAGNOSIS — I10 Essential (primary) hypertension: Secondary | ICD-10-CM | POA: Diagnosis not present

## 2022-03-31 DIAGNOSIS — N1832 Chronic kidney disease, stage 3b: Secondary | ICD-10-CM | POA: Diagnosis not present

## 2022-03-31 DIAGNOSIS — I25119 Atherosclerotic heart disease of native coronary artery with unspecified angina pectoris: Secondary | ICD-10-CM | POA: Diagnosis not present

## 2022-03-31 DIAGNOSIS — I255 Ischemic cardiomyopathy: Secondary | ICD-10-CM | POA: Diagnosis not present

## 2022-03-31 DIAGNOSIS — I5022 Chronic systolic (congestive) heart failure: Secondary | ICD-10-CM | POA: Diagnosis not present

## 2022-03-31 DIAGNOSIS — E1169 Type 2 diabetes mellitus with other specified complication: Secondary | ICD-10-CM | POA: Diagnosis not present

## 2022-04-13 DIAGNOSIS — I255 Ischemic cardiomyopathy: Secondary | ICD-10-CM | POA: Diagnosis not present

## 2022-04-13 DIAGNOSIS — N183 Chronic kidney disease, stage 3 unspecified: Secondary | ICD-10-CM | POA: Diagnosis not present

## 2022-04-13 DIAGNOSIS — E785 Hyperlipidemia, unspecified: Secondary | ICD-10-CM | POA: Diagnosis not present

## 2022-04-13 DIAGNOSIS — I129 Hypertensive chronic kidney disease with stage 1 through stage 4 chronic kidney disease, or unspecified chronic kidney disease: Secondary | ICD-10-CM | POA: Diagnosis not present

## 2022-04-13 DIAGNOSIS — N179 Acute kidney failure, unspecified: Secondary | ICD-10-CM | POA: Diagnosis not present

## 2022-04-13 DIAGNOSIS — Z87442 Personal history of urinary calculi: Secondary | ICD-10-CM | POA: Diagnosis not present

## 2022-04-19 NOTE — Progress Notes (Unsigned)
Cardiology Office Note:    Date:  04/22/2022   ID:  Michael Banks, DOB [birth year], MRN [id number]  PCP:  [facility]  Cardiologist:  Tobias Alexander, MD Advanced Practice Provider:  No care team member to display Electrophysiologist:  None   Referring MD: No ref. provider found    History of Present Illness:    Michael Banks is a 71 y.o. male with a hx of CAD s/p 5v CABG in 2019 with MVR, hypertension, HLD, tobacco abuse, and DMII who was previously followed by Dr. Delton See who now returns to clinic for follow-up.  The patient was admitted 04/2018 with shortness of breath and orthopnea.  Echo showed severely reduced LV function ejection fraction 20 to 25% with moderate MR.  Cardiac catheterization revealed severe CAD and severe MR.  Mild cardio viability study revealed viability in 12 out of 17 segments.  He underwent CABG x5 and MV repair 05/13/2018.  Briefly had atrial fibrillation postop converted to normal sinus rhythm with amiodarone.   Saw Dr. Delton See on 10/06/19 where he was doing very well. Was not able to start entresto due to cost. TTE 08/21/19 with LVEF 25-30%, G2DD, trivial MR with mean gradient . Noted to have AAA which is now monitored by Dr. Chestine Spore. Currently measures 4.89cm; plan to intervene if greater than 5.5.  During visit on 09/19/20, we started him on spironolactone but Cr rose to 1.4. We stopped it and re-check his Cr the next week and it continued to rise to 1.9 despite stopping the medication. Given concern for possible fluid overload, the patient was brought back to clinic for further evaluation.  Was seen in clinic on 10/09/20. We checked BNP and it was elevated at 1053. We recommended increasing his diuretics, however, Cr continued to rise. We therefore referred to renal at that time. Ultimately, it was determined that his renal dysfunction was secondary to a kidney stone.  Was evaluated by Dr. Graciela Husbands on 12/06/20 where he  had been off GDMT due to worsening renal function. He was resumed on entresto with goal to reassess LVEF once tolerating GDMT to determine if needs ICD. The patient has not started the medication yet.  Seen in clinic on 01/2021 by Dr. Graciela Husbands. He was resumed on coreg with plans to repeat TTE in 3 months to determine if needed ICD. Echo has not been performed yet.  Was last seen in clinic 06/2021 where he was doing well from a CV standpoint. TTE 10/2021 with LVEF 45%, apical hypokinesis, G2DD, mild RV dysfunction, mean mitral gradient 3.58mmHg, mild dilation of the aortic root 21mm.  Today, the patient overall feels well. Very excited about the improvement of his pumping function. Reflux is much better controlled with nexium. No recent renal stones. No chest pain, SOB, LE edema, orthopnea or PND. Remains active and walks about 1.5 miles. Feels well with activity and rests when needed. Tolerating medications without issues.    Past Medical History:  Diagnosis Date   Arthritis    Diabetes (HCC)    Dilated cardiomyopathy (HCC)    Gallstones    Hydronephrosis    Hyperglycemia    Hypertension    Kidney stone    UTI (urinary tract infection)     Past Surgical History:  Procedure Laterality Date   CORONARY ARTERY BYPASS GRAFT N/A 05/13/2018   Procedure: CORONARY ARTERY BYPASS GRAFTING (CABG) times _five__ using left internal mammary artery and right  greater saphenous vein harvested endoscopically.;  Surgeon: Melrose Nakayama, MD;  Location: [place];  Service: Open Heart Surgery;  Laterality: N/A;   KNEE ARTHROSCOPY     MITRAL VALVE REPAIR N/A 05/13/2018   Procedure: Mitral Valve Repair;  Surgeon: Melrose Nakayama, MD;  Location: [place];  Service: Open Heart Surgery;  Laterality: N/A;   NOSE SURGERY     RIGHT/LEFT HEART CATH AND CORONARY ANGIOGRAPHY N/A 05/05/2018   Procedure: RIGHT/LEFT HEART CATH AND CORONARY ANGIOGRAPHY;  Surgeon: Leonie Man, MD;  Location: [place] [facility];   Service: Cardiovascular;  Laterality: N/A;   TEE WITHOUT CARDIOVERSION N/A 05/13/2018   Procedure: TRANSESOPHAGEAL ECHOCARDIOGRAM (TEE);  Surgeon: Melrose Nakayama, MD;  Location: [place];  Service: Open Heart Surgery;  Laterality: N/A;   ULTRASOUND GUIDANCE FOR VASCULAR ACCESS  05/05/2018   Procedure: Ultrasound Guidance For Vascular Access;  Surgeon: Leonie Man, MD;  Location: [place] [facility];  Service: Cardiovascular;;    Current Medications: Current Meds  Medication Sig   aspirin EC 81 MG EC tablet Take 1 tablet (81 mg total) by mouth daily.   atorvastatin (LIPITOR) 80 MG tablet TAKE 1 TABLET BY MOUTH EVERY DAY AT 6 PM   carvedilol (COREG) 3.125 MG tablet Take 1 tablet (3.125 mg total) by mouth 2 (two) times daily with a meal.   dapagliflozin propanediol (FARXIGA) 10 MG TABS tablet Take 1 tablet (10 mg total) by mouth daily before breakfast.   esomeprazole (NEXIUM) 40 MG capsule Take 1 capsule (40 mg total) by mouth at bedtime.   fluticasone (FLONASE) 50 MCG/ACT nasal spray Place 1 spray into both nostrils daily.   sacubitril-valsartan (ENTRESTO) 24-26 MG Take 1 tablet by mouth 2 (two) times daily.   Simethicone (GAS-X EXTRA STRENGTH PO) Take by mouth. Gel caps uses as needed     Allergies:   Patient has no known allergies.   Social History   Socioeconomic History   Marital status: Single    Spouse name: Not on file   Number of children: Not on file   Years of education: Not on file   Highest education level: Not on file  Occupational History   Not on file  Tobacco Use   Smoking status: Former    Types: Cigarettes    Quit date: 2018    Years since quitting: 5.8   Smokeless tobacco: Never  Vaping Use   Vaping Use: Never used  Substance and Sexual Activity   Alcohol use: Never   Drug use: Never   Sexual activity: Not on file  Other Topics Concern   Not on file  Social History Narrative   Not on file   Social Determinants of Health   Financial Resource  Strain: Not on file  Food Insecurity: Not on file  Transportation Needs: Not on file  Physical Activity: Not on file  Stress: Not on file  Social Connections: Not on file     Family History: The patient's family history includes CAD in his brother; CVA in his father.  ROS:   Please see the history of present illness.    Review of Systems  Constitutional:  Negative for chills and fever.  HENT:  Negative for hearing loss.   Eyes:  Negative for blurred vision.  Respiratory:  Negative for shortness of breath.   Cardiovascular:  Negative for chest pain, palpitations, orthopnea, claudication, leg swelling and PND.  Gastrointestinal:  Positive for heartburn. Negative for nausea.  Genitourinary:  Negative for flank pain and hematuria.  Musculoskeletal:  Negative for falls, joint pain and myalgias.  Skin:  Negative for itching and rash.  Neurological:  Negative for dizziness and loss of consciousness.  Endo/Heme/Allergies:  Does not bruise/bleed easily.  Psychiatric/Behavioral:  Negative for substance abuse.     EKGs/Labs/Other Studies Reviewed:    The following studies were reviewed today: TTE 10/2021: IMPRESSIONS     1. Left ventricular ejection fraction, by estimation, is 45%. The left  ventricle has mildly decreased function. The left ventricle demonstrates  regional wall motion abnormalities (apical hypokinesis with no thrombus).  There is mild concentric left  ventricular hypertrophy. Left ventricular diastolic parameters are  consistent with Grade II diastolic dysfunction (pseudonormalization).   2. Right ventricular systolic function is mildly reduced. The right  ventricular size is moderately enlarged.   3. The mitral valve has been repaired/replaced. No evidence of mitral  valve regurgitation. The mean mitral valve gradient is 3.5 mmHg with  average heart rate of 62 bpm. Moderate mitral annular calcification.   4. The aortic valve was not well visualized. Aortic valve  regurgitation  is trivial.   5. Aortic dilatation noted. There is mild dilatation of the aortic root,  measuring 41 mm.   6. The inferior vena cava is normal in size with greater than 50%  respiratory variability, suggesting right atrial pressure of 3 mmHg.   Comparison(s): Prior images reviewed side by side. Compared to prior  study, LVEF has improved. LVEF appears most improved on contrast images  (patient did not consent to these in 2022 study).  Monitor 12/31/20 Patch Wear Time:  6 days and 20 hours   Patient had a min HR of 47 bpm, max HR of 72 bpm, and avg HR of 56 bpm. Predominant underlying rhythm was Sinus Rhythm. Idioventricular Rhythm was present. Isolated SVEs were rare (<1.0%), and no SVE Couplets or SVE Triplets were present.  Isolated VEs were occasional (1.1%, 5776), and no VE C   recommendations  1) insufficient ventricular ectopy to explain cardiomyopathy  2) impressive chronotropic incompetence 3) prob appropriate to proceed with dual chamber ICD  TTE 11/22/20: IMPRESSIONS   1. Left ventricular ejection fraction, by estimation, is 30 to 35%. The  left ventricle has moderately decreased function. The left ventricle has  no regional wall motion abnormalities. There is mild left ventricular  hypertrophy of the basal-septal  segment. Left ventricular diastolic parameters are indeterminate. Elevated  left ventricular end-diastolic pressure.   2. Right ventricular systolic function is normal. The right ventricular  size is normal. Tricuspid regurgitation signal is inadequate for assessing  PA pressure.   3. The mitral valve has been repaired/replaced. Trivial mitral valve  regurgitation. Mild mitral stenosis. The mean mitral valve gradient is 5.0  mmHg.   4. The aortic valve is normal in structure. Aortic valve regurgitation is  mild. No aortic stenosis is present.   TTE 08/21/19:  1. Left ventricular ejection fraction, by estimation, is 25 to 30%. The  left  ventricle has severely decreased function. The left ventricle  demonstrates global hypokinesis. There is mild left ventricular  hypertrophy. Left ventricular diastolic parameters   are consistent with Grade II diastolic dysfunction (pseudonormalization).  Elevated left atrial pressure. Patient refused Definity.   2. Right ventricular systolic function is mildly reduced. The right  ventricular size is mildly enlarged.   3. The mitral valve has been repaired/replaced. There is a 32 mm  prosthetic annuloplasty ring present in the mitral position. Trivial  regurgitation. Mean gradient 4 mmHg.   4.  The aortic valve was not well visualized. Aortic valve regurgitation  is mild.   5. Compared to prior TTE on 01/31/19, no significant change in LV systolic  Function  Cath 05/05/18: ISCHEMIC CARDIOMYOPATHY Hemodynamic findings consistent with moderate pulmonary hypertension -secondary to pulmonary venous hypertension LV end diastolic pressure and pulmonary capillary wedge pressure are moderately elevated. There is severe (4+) mitral regurgitation. SEVERE MULTIVESSEL DISEASE Prox RCA to Mid RCA lesion is 90% stenosed. Mid RCA to Dist RCA lesion is 100% stenosed with 100% stenosed side branch in Post Atrio. Prox LAD lesion is 99% stenosed. Mid LAD-1 lesion is 80% stenosed. Mid LAD-2 lesion is 90% stenosed. Dist LAD lesion is 85% stenosed. Prox Cx lesion is 95% stenosed. 2nd Mrg lesion is 90% stenosed with 90% stenosed side branch in Lat 2nd Mrg.   SUMMARY Severe multivessel CAD: Diffuse proximal RCA disease with mid 100% CTO filling with bridging collaterals and right-to-left collaterals, separate ostium circumflex with proximal 95% stenosis and bifurcation 90% stenosis of OM1, 99% mid LAD after very large septal perforator trunk (perfusing PDA) with bridging collaterals filling mid to distal LAD with a large diagonal branch and diffuse disease throughout the distal LAD. Moderate pulmonary hypertension  likely secondary to pulmonary venous congestion and mitral regurgitation. At least moderate-severe if not severe mitral regurgitation (likely ischemic) Moderately elevated LVEDP and PCWP consistent with acute diastolic heart failure in conjunction with acute systolic heart failure from Ischemic Cardiomyopathy  At least mildly reduced cardiac output of 4.8, index 2.39.     RECOMMENDATION Return to nursing unit for TR band removal. Continue aggressive CHF management The patient would likely best benefit from CABG plus possible mitral valve repair. --We will consult CVTS Will place on IV heparin 8 hours after sheath removal Consider viability study as part of assessment for revascularization options. Percutaneous options would be limited and extremely high risk given his low output and multivessel disease.  Would likely need support with Impella Recommend Aspirin 81mg  daily for severe CAD.  Ultrasound 05/21/20: Abdominal Aorta: There is evidence of abnormal dilatation of the distal  Abdominal aorta. Previous diameter measurement was 4.6 cm obtained on  08/21/19 by CTA.   EKG: 07/22/21: Sinus rhythm, rate 61 bpm  Recent Labs: No results found for requested labs within last 365 days.   Recent Lipid Panel    Component Value Date/Time   CHOL 122 08/03/2018 0819   TRIG 76 08/03/2018 0819   HDL 46 08/03/2018 0819   CHOLHDL 2.7 08/03/2018 0819   CHOLHDL 4.2 05/04/2018 0433   VLDL 11 05/04/2018 0433   LDLCALC 61 08/03/2018 0819     Risk Assessment/Calculations:       Physical Exam:    VS:  BP 110/72   Pulse 70   Ht 5\' 9"  (1.753 m)   Wt 182 lb 9.6 oz (82.8 kg)   SpO2 98%   BMI 26.97 kg/m     Wt Readings from Last 3 Encounters:  04/22/22 182 lb 9.6 oz (82.8 kg)  07/22/21 177 lb 9.6 oz (80.6 kg)  06/24/21 176 lb (79.8 kg)    GEN:  Well nourished, well developed in no acute distress HEENT: Normal NECK: No JVD; No carotid bruits CARDIAC: RRR, no murmurs, rubs,  gallops RESPIRATORY:  Clear to auscultation without rales, wheezing or rhonchi  ABDOMEN: Soft, non-tender, non-distended MUSCULOSKELETAL:  No edema; No deformity  SKIN: Warm and dry NEUROLOGIC:  Alert and oriented x 3 PSYCHIATRIC:  Normal affect   ASSESSMENT:  1. Coronary artery disease of native artery of native heart with stable angina pectoris (HCC)   2. Chronic systolic heart failure (HCC)   3. S/P CABG x 5   4. Infrarenal abdominal aortic aneurysm (AAA) without rupture (HCC)   5. Mixed hyperlipidemia   6. Primary hypertension   7. S/P mitral valve repair   8. Diabetes mellitus with coincident hypertension (HCC)     PLAN:    In order of problems listed above:  #Ischemic Cardiomyopathy: #Chronic Systolic HF: TTE 66/4403 with LVEF 25-30%-->30-35% in 11/2020-->45% in 10/2021. Appears euvolemic today with no HF symptoms. Tolerating medications without issue. BP well controlled. No anginal symptoms. -Continue coreg 3.125mg  BID -Continue entresto 24/26mg  BID -Continue farxiga 10mg  daily -TTE with EF improved to 45%  #CAD s/p 5V CABG with MVR in 2019: No anginal symptoms. Remains active. -Continue ASA 81mg  daily and lipitor 80mg  daily -Continue coreg 3.125mg  BID -Continue entresto 24/26mg  BID as above  #AAA: Monitored by Dr. 2020. Measures 5.0cm on most recent ultrasound 06/2021. -Continue surveillance ultrasounds q24months per Dr. Chestine Spore -Threshold to repair is 5.5cm unless rapid growth or symptoms -Continue aggressive blood pressure control -Continue ASA 81mg  daily and lipitor 80mg  daily -Follow-up with Dr. 07/2021 as scheduled  #Ascending Aortic Aneurysm: Aortic root 75mm on TTE 10/2021. -Serial echoes yearly  #CKD stage IIIa: -Follows with [facility] -Cr stable at 2 with GFR in 30s  #HTN: Controlled and at goal.  -Continue coreg 3.125mg  BID -Continue entresto 24/26mg  BID as above  #HLD: LDL at goal at 45. -Continue lipitor 80mg  daily  #DMII: Diet  controlled. A1C 6.5. -Continue lifestyle modifications -Continue farxiga as above  #GERD: Significantly improved with nexium. -Continue nexium  #Prevention: -Recommend COVID, RSV and flu shots  Medication Adjustments/Labs and Tests Ordered: Current medicines are reviewed at length with the patient today.  Concerns regarding medicines are outlined above.  No orders of the defined types were placed in this encounter.  No orders of the defined types were placed in this encounter.   Patient Instructions  Medication Instructions:   Your physician recommends that you continue on your current medications as directed. Please refer to the Current Medication list given to you today.  *If you need a refill on your cardiac medications before your next appointment, please call your pharmacy*    Follow-Up: At [facility], you and your health needs are our priority.  As part of our continuing mission to provide you with exceptional heart care, we have created designated Provider Care Teams.  These Care Teams include your primary Cardiologist (physician) and Advanced Practice Providers (APPs -  Physician Assistants and Nurse Practitioners) who all work together to provide you with the care you need, when you need it.  We recommend signing up for the patient portal called "MyChart".  Sign up information is provided on this After Visit Summary.  MyChart is used to connect with patients for Virtual Visits (Telemedicine).  Patients are able to view lab/test results, encounter notes, upcoming appointments, etc.  Non-urgent messages can be sent to your provider as well.   To learn more about what you can do with MyChart, go to .    Your next appointment:   6 month(s)  The format for your next appointment:   In Person  Provider:   Dr. Chestine Spore   Important Information About Sugar         Signed, 53m, MD  04/22/2022 10:53 AM    [facility] 

## 2022-04-22 ENCOUNTER — Encounter: Payer: Self-pay | Admitting: Cardiology

## 2022-04-22 ENCOUNTER — Ambulatory Visit: Payer: Medicare HMO | Attending: Cardiology | Admitting: Cardiology

## 2022-04-22 VITALS — BP 110/72 | HR 70 | Ht 69.0 in | Wt 182.6 lb

## 2022-04-22 DIAGNOSIS — E782 Mixed hyperlipidemia: Secondary | ICD-10-CM

## 2022-04-22 DIAGNOSIS — E119 Type 2 diabetes mellitus without complications: Secondary | ICD-10-CM | POA: Diagnosis not present

## 2022-04-22 DIAGNOSIS — Z9889 Other specified postprocedural states: Secondary | ICD-10-CM

## 2022-04-22 DIAGNOSIS — I25118 Atherosclerotic heart disease of native coronary artery with other forms of angina pectoris: Secondary | ICD-10-CM

## 2022-04-22 DIAGNOSIS — I7143 Infrarenal abdominal aortic aneurysm, without rupture: Secondary | ICD-10-CM

## 2022-04-22 DIAGNOSIS — I5022 Chronic systolic (congestive) heart failure: Secondary | ICD-10-CM

## 2022-04-22 DIAGNOSIS — I10 Essential (primary) hypertension: Secondary | ICD-10-CM | POA: Diagnosis not present

## 2022-04-22 DIAGNOSIS — Z951 Presence of aortocoronary bypass graft: Secondary | ICD-10-CM

## 2022-04-22 NOTE — Patient Instructions (Addendum)
Medication Instructions:   Your physician recommends that you continue on your current medications as directed. Please refer to the Current Medication list given to you today.  *If you need a refill on your cardiac medications before your next appointment, please call your pharmacy*   Testing:  Your physician has requested that you have an echocardiogram. Echocardiography is a painless test that uses sound waves to create images of your heart. It provides your doctor with information about the size and shape of your heart and how well your heart's chambers and valves are working. This procedure takes approximately one hour. There are no restrictions for this procedure.  SCHEDULE ECHO TO BE DONE IN MAY 2024 PER DR PEMBERTON Please do NOT wear cologne, perfume, aftershave, or lotions (deodorant is allowed). Please arrive 15 minutes prior to your appointment time.      Follow-Up: At [facility], you and your health needs are our priority.  As part of our continuing mission to provide you with exceptional heart care, we have created designated Provider Care Teams.  These Care Teams include your primary Cardiologist (physician) and Advanced Practice Providers (APPs -  Physician Assistants and Nurse Practitioners) who all work together to provide you with the care you need, when you need it.  We recommend signing up for the patient portal called "MyChart".  Sign up information is provided on this After Visit Summary.  MyChart is used to connect with patients for Virtual Visits (Telemedicine).  Patients are able to view lab/test results, encounter notes, upcoming appointments, etc.  Non-urgent messages can be sent to your provider as well.   To learn more about what you can do with MyChart, go to [email or web address].    Your next appointment:   6 month(s)  The format for your next appointment:   In Person  Provider:   Dr. Johney Frame   Important Information About  Sugar

## 2022-04-22 NOTE — Addendum Note (Signed)
Addended by: Nuala Alpha on: 04/22/2022 10:58 AM   Modules accepted: Orders

## 2022-06-23 ENCOUNTER — Other Ambulatory Visit: Payer: Self-pay

## 2022-06-23 DIAGNOSIS — Z79899 Other long term (current) drug therapy: Secondary | ICD-10-CM

## 2022-06-23 DIAGNOSIS — I25810 Atherosclerosis of coronary artery bypass graft(s) without angina pectoris: Secondary | ICD-10-CM

## 2022-06-23 DIAGNOSIS — Z951 Presence of aortocoronary bypass graft: Secondary | ICD-10-CM

## 2022-06-23 DIAGNOSIS — R7989 Other specified abnormal findings of blood chemistry: Secondary | ICD-10-CM

## 2022-06-23 DIAGNOSIS — I5022 Chronic systolic (congestive) heart failure: Secondary | ICD-10-CM

## 2022-06-23 DIAGNOSIS — I255 Ischemic cardiomyopathy: Secondary | ICD-10-CM

## 2022-06-23 MED ORDER — DAPAGLIFLOZIN PROPANEDIOL 10 MG PO TABS: 10.0000 mg | ORAL_TABLET | Freq: Every day | ORAL | 3 refills | Status: DC

## 2022-07-09 ENCOUNTER — Other Ambulatory Visit: Payer: Self-pay | Admitting: *Deleted

## 2022-07-09 MED ORDER — CARVEDILOL 3.125 MG PO TABS: 3.1250 mg | ORAL_TABLET | Freq: Two times a day (BID) | ORAL | 2 refills | Status: DC

## 2022-08-21 ENCOUNTER — Other Ambulatory Visit: Payer: Self-pay

## 2022-08-21 DIAGNOSIS — Z79899 Other long term (current) drug therapy: Secondary | ICD-10-CM

## 2022-08-21 DIAGNOSIS — I25118 Atherosclerotic heart disease of native coronary artery with other forms of angina pectoris: Secondary | ICD-10-CM

## 2022-08-21 DIAGNOSIS — I5022 Chronic systolic (congestive) heart failure: Secondary | ICD-10-CM

## 2022-08-21 MED ORDER — ESOMEPRAZOLE MAGNESIUM 40 MG PO CPDR: 40.0000 mg | DELAYED_RELEASE_CAPSULE | Freq: Every day | ORAL | 3 refills | Status: DC

## 2022-08-21 NOTE — Telephone Encounter (Signed)
[facility] pharmacy is requesting a refill on Esomeprazole. Would Dr. Johney Frame like to refill this medication? Please address

## 2022-08-24 ENCOUNTER — Encounter: Payer: Self-pay | Admitting: Gastroenterology

## 2022-08-31 ENCOUNTER — Other Ambulatory Visit: Payer: Self-pay | Admitting: *Deleted

## 2022-08-31 DIAGNOSIS — I5022 Chronic systolic (congestive) heart failure: Secondary | ICD-10-CM

## 2022-08-31 DIAGNOSIS — Z951 Presence of aortocoronary bypass graft: Secondary | ICD-10-CM

## 2022-08-31 DIAGNOSIS — R7989 Other specified abnormal findings of blood chemistry: Secondary | ICD-10-CM

## 2022-08-31 DIAGNOSIS — Z79899 Other long term (current) drug therapy: Secondary | ICD-10-CM

## 2022-08-31 DIAGNOSIS — I255 Ischemic cardiomyopathy: Secondary | ICD-10-CM

## 2022-08-31 DIAGNOSIS — I25810 Atherosclerosis of coronary artery bypass graft(s) without angina pectoris: Secondary | ICD-10-CM

## 2022-08-31 MED ORDER — ENTRESTO 24-26 MG PO TABS: 1.0000 | ORAL_TABLET | Freq: Two times a day (BID) | ORAL | 0 refills | Status: DC

## 2022-08-31 NOTE — Addendum Note (Signed)
Addended by: Gaetano Net on: 08/31/2022 09:46 AM   Modules accepted: Orders

## 2022-09-29 DIAGNOSIS — E1169 Type 2 diabetes mellitus with other specified complication: Secondary | ICD-10-CM | POA: Diagnosis not present

## 2022-09-29 DIAGNOSIS — Z1331 Encounter for screening for depression: Secondary | ICD-10-CM | POA: Diagnosis not present

## 2022-09-29 DIAGNOSIS — I5022 Chronic systolic (congestive) heart failure: Secondary | ICD-10-CM | POA: Diagnosis not present

## 2022-09-29 DIAGNOSIS — D6869 Other thrombophilia: Secondary | ICD-10-CM | POA: Diagnosis not present

## 2022-09-29 DIAGNOSIS — Z0000 Encounter for general adult medical examination without abnormal findings: Secondary | ICD-10-CM | POA: Diagnosis not present

## 2022-09-29 DIAGNOSIS — Z125 Encounter for screening for malignant neoplasm of prostate: Secondary | ICD-10-CM | POA: Diagnosis not present

## 2022-09-29 DIAGNOSIS — N200 Calculus of kidney: Secondary | ICD-10-CM | POA: Diagnosis not present

## 2022-09-29 DIAGNOSIS — I25119 Atherosclerotic heart disease of native coronary artery with unspecified angina pectoris: Secondary | ICD-10-CM | POA: Diagnosis not present

## 2022-09-29 DIAGNOSIS — Z1211 Encounter for screening for malignant neoplasm of colon: Secondary | ICD-10-CM | POA: Diagnosis not present

## 2022-09-29 DIAGNOSIS — I714 Abdominal aortic aneurysm, without rupture, unspecified: Secondary | ICD-10-CM | POA: Diagnosis not present

## 2022-09-29 DIAGNOSIS — L602 Onychogryphosis: Secondary | ICD-10-CM | POA: Diagnosis not present

## 2022-10-20 DIAGNOSIS — I129 Hypertensive chronic kidney disease with stage 1 through stage 4 chronic kidney disease, or unspecified chronic kidney disease: Secondary | ICD-10-CM | POA: Diagnosis not present

## 2022-10-20 DIAGNOSIS — Z87442 Personal history of urinary calculi: Secondary | ICD-10-CM | POA: Diagnosis not present

## 2022-10-20 DIAGNOSIS — I255 Ischemic cardiomyopathy: Secondary | ICD-10-CM | POA: Diagnosis not present

## 2022-10-20 DIAGNOSIS — N179 Acute kidney failure, unspecified: Secondary | ICD-10-CM | POA: Diagnosis not present

## 2022-10-20 DIAGNOSIS — E785 Hyperlipidemia, unspecified: Secondary | ICD-10-CM | POA: Diagnosis not present

## 2022-10-20 DIAGNOSIS — N183 Chronic kidney disease, stage 3 unspecified: Secondary | ICD-10-CM | POA: Diagnosis not present

## 2022-10-21 ENCOUNTER — Ambulatory Visit (HOSPITAL_COMMUNITY): Payer: Medicare HMO | Attending: Cardiology

## 2022-10-21 DIAGNOSIS — I7143 Infrarenal abdominal aortic aneurysm, without rupture: Secondary | ICD-10-CM

## 2022-10-21 DIAGNOSIS — I5022 Chronic systolic (congestive) heart failure: Secondary | ICD-10-CM

## 2022-10-21 DIAGNOSIS — Z9889 Other specified postprocedural states: Secondary | ICD-10-CM | POA: Diagnosis not present

## 2022-10-21 LAB — LAB REPORT - SCANNED
Creatinine, POC: 32.7 mg/dL
EGFR: 35
Microalb Creat Ratio: 128

## 2022-10-21 LAB — ECHOCARDIOGRAM COMPLETE
Area-P 1/2: 2.01 cm2
S' Lateral: 2.9 cm

## 2022-10-21 MED ORDER — PERFLUTREN LIPID MICROSPHERE
1.0000 mL | INTRAVENOUS | Status: AC | PRN
  Administered 2022-10-21: 2 mL via INTRAVENOUS

## 2022-10-29 ENCOUNTER — Ambulatory Visit (INDEPENDENT_AMBULATORY_CARE_PROVIDER_SITE_OTHER): Payer: Medicare HMO | Admitting: Podiatry

## 2022-10-29 DIAGNOSIS — B351 Tinea unguium: Secondary | ICD-10-CM

## 2022-10-29 DIAGNOSIS — L603 Nail dystrophy: Secondary | ICD-10-CM | POA: Diagnosis not present

## 2022-10-29 NOTE — Progress Notes (Signed)
  Subjective:  Patient ID: Michael Banks, male    DOB: February 23, 1951,  MRN: [id number]  Chief Complaint  Patient presents with   Nail Problem    Left great toenail concerns.    72 y.o. male presents with concern for left great toe nail discoloration thickening and the abnormal growth.  He says it does not cause him pain at all at this time.  There was some pain previously but he started using tea tree oil on the nail and the pain went away completely he wanted to confirm that this was in fact nail fungus and see what options for treatment  Past Medical History:  Diagnosis Date   Arthritis    Diabetes (HCC)    Dilated cardiomyopathy (HCC)    Gallstones    Hydronephrosis    Hyperglycemia    Hypertension    Kidney stone    UTI (urinary tract infection)     No Known Allergies  ROS: Negative except as per HPI above  Objective:  General: AAO x3, NAD  Dermatological: Left hallux nail with significant yellow discoloration and thickening double nail appearance subungual debris overall consistent with onychomycosis of the left hallux nail with dystrophy.  No pain on palpation.  No drainage.  Vascular:  Dorsalis Pedis artery and Posterior Tibial artery pedal pulses are 2/4 bilateral.  Capillary fill time < 3 sec to all digits.   Neruologic: Grossly intact via light touch bilateral. Protective threshold intact to all sites bilateral.   Musculoskeletal: No gross boney pedal deformities bilateral. No pain, crepitus, or limitation noted with foot and ankle range of motion bilateral. Muscular strength 5/5 in all groups tested bilateral.  Gait: Unassisted, Nonantalgic.   No images are attached to the encounter.   Assessment:   1. Onychodystrophy   2. Onychomycosis      Plan:  Patient was evaluated and treated and all questions answered.  # Onychomycosis of left hallux nail with onychodystrophy -Discussed treatment options in detail with the patient -Could continue with topical  tea tree oil application daily if it is helping which she says it is -I also discussed using topical or oral antifungal however I do not recommend this given the nature of the nail fungal infection -I also discussed total hallux nail removal with phenol and alcohol matrixectomy to prevent regrowth if the patient desired.  He will consider this and if the pain returns I encouraged him to call and we will get him back in for this procedure  Return if symptoms worsen or fail to improve.          Corinna Gab, DPM [facility]

## 2022-11-22 ENCOUNTER — Encounter: Payer: Self-pay | Admitting: Cardiology

## 2022-11-22 DIAGNOSIS — I25810 Atherosclerosis of coronary artery bypass graft(s) without angina pectoris: Secondary | ICD-10-CM

## 2022-11-22 DIAGNOSIS — I5022 Chronic systolic (congestive) heart failure: Secondary | ICD-10-CM

## 2022-11-22 DIAGNOSIS — R7989 Other specified abnormal findings of blood chemistry: Secondary | ICD-10-CM

## 2022-11-22 DIAGNOSIS — Z79899 Other long term (current) drug therapy: Secondary | ICD-10-CM

## 2022-11-22 DIAGNOSIS — I255 Ischemic cardiomyopathy: Secondary | ICD-10-CM

## 2022-11-22 DIAGNOSIS — Z951 Presence of aortocoronary bypass graft: Secondary | ICD-10-CM

## 2022-11-23 MED ORDER — ENTRESTO 24-26 MG PO TABS: 1.0000 | ORAL_TABLET | Freq: Two times a day (BID) | ORAL | 1 refills | Status: DC

## 2022-11-23 MED ORDER — ATORVASTATIN CALCIUM 80 MG PO TABS: ORAL_TABLET | ORAL | 1 refills | Status: DC

## 2022-11-23 NOTE — Telephone Encounter (Signed)
Patient scheduled with Dr. Anne Fu on 11/04 at 10:20am

## 2022-11-25 ENCOUNTER — Ambulatory Visit: Payer: Medicare HMO | Admitting: Physician Assistant

## 2023-01-07 ENCOUNTER — Encounter: Payer: Self-pay | Admitting: Gastroenterology

## 2023-01-11 DIAGNOSIS — Z1211 Encounter for screening for malignant neoplasm of colon: Secondary | ICD-10-CM | POA: Diagnosis not present

## 2023-01-11 DIAGNOSIS — Z1212 Encounter for screening for malignant neoplasm of rectum: Secondary | ICD-10-CM | POA: Diagnosis not present

## 2023-03-15 ENCOUNTER — Other Ambulatory Visit: Payer: Self-pay

## 2023-03-15 DIAGNOSIS — R7989 Other specified abnormal findings of blood chemistry: Secondary | ICD-10-CM

## 2023-03-15 DIAGNOSIS — I255 Ischemic cardiomyopathy: Secondary | ICD-10-CM

## 2023-03-15 DIAGNOSIS — Z951 Presence of aortocoronary bypass graft: Secondary | ICD-10-CM

## 2023-03-15 DIAGNOSIS — I5022 Chronic systolic (congestive) heart failure: Secondary | ICD-10-CM

## 2023-03-15 DIAGNOSIS — Z79899 Other long term (current) drug therapy: Secondary | ICD-10-CM

## 2023-03-15 DIAGNOSIS — I25810 Atherosclerosis of coronary artery bypass graft(s) without angina pectoris: Secondary | ICD-10-CM

## 2023-03-15 MED ORDER — DAPAGLIFLOZIN PROPANEDIOL 10 MG PO TABS: 10.0000 mg | ORAL_TABLET | Freq: Every day | ORAL | 0 refills | Status: DC

## 2023-03-26 ENCOUNTER — Other Ambulatory Visit: Payer: Self-pay | Admitting: *Deleted

## 2023-03-26 MED ORDER — CARVEDILOL 3.125 MG PO TABS: 3.1250 mg | ORAL_TABLET | Freq: Two times a day (BID) | ORAL | 0 refills | Status: DC

## 2023-03-31 DIAGNOSIS — R300 Dysuria: Secondary | ICD-10-CM | POA: Diagnosis not present

## 2023-04-26 ENCOUNTER — Ambulatory Visit: Payer: Medicare HMO | Attending: Cardiology | Admitting: Cardiology

## 2023-04-26 ENCOUNTER — Encounter: Payer: Self-pay | Admitting: Cardiology

## 2023-04-26 VITALS — BP 120/64 | HR 66 | Ht 69.0 in | Wt 184.8 lb

## 2023-04-26 DIAGNOSIS — I5022 Chronic systolic (congestive) heart failure: Secondary | ICD-10-CM | POA: Diagnosis not present

## 2023-04-26 DIAGNOSIS — Z951 Presence of aortocoronary bypass graft: Secondary | ICD-10-CM

## 2023-04-26 DIAGNOSIS — Z09 Encounter for follow-up examination after completed treatment for conditions other than malignant neoplasm: Secondary | ICD-10-CM

## 2023-04-26 DIAGNOSIS — I255 Ischemic cardiomyopathy: Secondary | ICD-10-CM | POA: Diagnosis not present

## 2023-04-26 NOTE — Patient Instructions (Signed)

## 2023-04-26 NOTE — Progress Notes (Signed)
Cardiology Office Note:  .   Date:  04/26/2023  ID:  Michael Banks, DOB [birth year], MRN [id number] PCP: Lorenda Ishihara, MD  [name] Cardiologist:  Donato Schultz, MD     History of Present Illness: .   Michael Banks is a 72 y.o. male Discussed with the use of AI scribe software   History of Present Illness   The patient, a 72 year old with a history of coronary artery disease status post CABG and mitral valve replacement in 2019, hypertension, hyperlipidemia, tobacco use, and type 2 diabetes, presents for follow-up. In 2019, he was admitted with ischemic cardiomyopathy with an EF of 20% and moderate mitral regurgitation. Post CABG, he experienced brief atrial fibrillation and had difficulty affording Entresto. He also has an abdominal aortic aneurysm, currently measuring approximately 5cm, monitored by Dr. Chestine Spore.  The patient reports feeling better on the current medication regimen, with an improvement in his heart function. However, he expresses concern about acid reflux, which he describes as the worst pain he's ever experienced. He is currently managing this with Nexium and Gas X, but it remains a significant issue. He has seen a gastroenterologist in the past for this issue.  Despite his heart condition, the patient has made lifestyle changes, including giving up smoking, exercising, and eating right. He reports walking everywhere as he no longer drives and has reduced his food intake, although he admits to frequently eating processed foods.          ROS: +GERD, no SOB, no syncope  Studies Reviewed: Marland Kitchen   EKG Interpretation Date/Time:  Monday April 26 2023 10:28:49 EST Ventricular Rate:  66 PR Interval:  182 QRS Duration:  146 QT Interval:  458 QTC Calculation: 480 R Axis:   -50  Text Interpretation: Normal sinus rhythm Right bundle branch block Left anterior fascicular block Bifascicular block Possible Lateral infarct (cited on or before  04-May-2018) Inferior infarct (cited on or before 04-May-2018) When compared with ECG of 14-May-2018 07:03, Right bundle branch block is new Confirmed by Donato Schultz ([id number]) on 04/26/2023 10:31:08 AM    Results LABS LDL: 50 (09/29/2022) Hemoglobin A1c: 6.9 (09/29/2022) Creatinine: 6.1 (09/29/2022)  RADIOLOGY Aortic root measurement: 41 mm, mildly dilated (11/08/2021)  DIAGNOSTIC Transthoracic echocardiogram: Ejection fraction 45% (11/08/2021) EKG: Stable  Risk Assessment/Calculations:            Physical Exam:   VS:  BP 120/64   Pulse 66   Ht 5\' 9"  (1.753 m)   Wt 184 lb 12.8 oz (83.8 kg)   SpO2 97%   BMI 27.29 kg/m    Wt Readings from Last 3 Encounters:  04/26/23 184 lb 12.8 oz (83.8 kg)  04/22/22 182 lb 9.6 oz (82.8 kg)  07/22/21 177 lb 9.6 oz (80.6 kg)    GEN: Well nourished, well developed in no acute distress NECK: No JVD; No carotid bruits CARDIAC: CABG scar RRR, no murmurs, no rubs, no gallops RESPIRATORY:  Clear to auscultation without rales, wheezing or rhonchi  ABDOMEN: Soft, non-tender, non-distended EXTREMITIES:  No edema; No deformity   ASSESSMENT AND PLAN: .       Ischemic Cardiomyopathy with Chronic Systolic Heart Failure CAD/CABG EF improved from 20% to 45% after CABG and mitral valve repair in 2019. Currently on Carvedilol 3.125mg , Entresto 24/26mg  BID, and Farxiga 10mg  daily. -Continue current medications. -Annual follow-up.  Abdominal Aortic Aneurysm Monitored by Dr. Chestine Spore, currently at 5mm. Repair threshold is 5.73mm. -Continue monitoring with Dr. Chestine Spore.  Hyperlipidemia LDL at goal (  50 on 09/29/22). -Continue current management.  Type 2 Diabetes Hemoglobin A1c 6.1, improved with dietary modifications and Farxiga. -Continue current management.  Gastroesophageal Reflux Disease Severe symptoms managed with Nexium and Gas X. -Continue current management. -Encourage dietary modifications.  Mitral Valve Replacement Dental prophylaxis due to  mitral valve replacement in 2019. -Continue dental prophylaxis as needed.  Tobacco Use Quit prior to CABG in 2019. -Encourage continued abstinence.             Dispo: 1 yr  Signed, Donato Schultz, MD

## 2023-05-04 DIAGNOSIS — I129 Hypertensive chronic kidney disease with stage 1 through stage 4 chronic kidney disease, or unspecified chronic kidney disease: Secondary | ICD-10-CM | POA: Diagnosis not present

## 2023-05-04 DIAGNOSIS — Z87442 Personal history of urinary calculi: Secondary | ICD-10-CM | POA: Diagnosis not present

## 2023-05-04 DIAGNOSIS — N183 Chronic kidney disease, stage 3 unspecified: Secondary | ICD-10-CM | POA: Diagnosis not present

## 2023-05-04 DIAGNOSIS — E785 Hyperlipidemia, unspecified: Secondary | ICD-10-CM | POA: Diagnosis not present

## 2023-05-04 DIAGNOSIS — N179 Acute kidney failure, unspecified: Secondary | ICD-10-CM | POA: Diagnosis not present

## 2023-05-04 DIAGNOSIS — I255 Ischemic cardiomyopathy: Secondary | ICD-10-CM | POA: Diagnosis not present

## 2023-05-19 ENCOUNTER — Other Ambulatory Visit: Payer: Self-pay

## 2023-05-19 DIAGNOSIS — R7989 Other specified abnormal findings of blood chemistry: Secondary | ICD-10-CM

## 2023-05-19 DIAGNOSIS — Z951 Presence of aortocoronary bypass graft: Secondary | ICD-10-CM

## 2023-05-19 DIAGNOSIS — Z79899 Other long term (current) drug therapy: Secondary | ICD-10-CM

## 2023-05-19 DIAGNOSIS — I25810 Atherosclerosis of coronary artery bypass graft(s) without angina pectoris: Secondary | ICD-10-CM

## 2023-05-19 DIAGNOSIS — I5022 Chronic systolic (congestive) heart failure: Secondary | ICD-10-CM

## 2023-05-19 DIAGNOSIS — I255 Ischemic cardiomyopathy: Secondary | ICD-10-CM

## 2023-05-19 MED ORDER — ENTRESTO 24-26 MG PO TABS: 1.0000 | ORAL_TABLET | Freq: Two times a day (BID) | ORAL | 3 refills | Status: DC

## 2023-06-22 ENCOUNTER — Other Ambulatory Visit: Payer: Self-pay | Admitting: Cardiology

## 2023-07-13 ENCOUNTER — Other Ambulatory Visit: Payer: Self-pay

## 2023-07-13 MED ORDER — ATORVASTATIN CALCIUM 80 MG PO TABS: ORAL_TABLET | ORAL | 3 refills | Status: DC

## 2023-08-04 ENCOUNTER — Other Ambulatory Visit: Payer: Self-pay

## 2023-08-04 DIAGNOSIS — I25118 Atherosclerotic heart disease of native coronary artery with other forms of angina pectoris: Secondary | ICD-10-CM

## 2023-08-04 DIAGNOSIS — I5022 Chronic systolic (congestive) heart failure: Secondary | ICD-10-CM

## 2023-08-04 DIAGNOSIS — Z79899 Other long term (current) drug therapy: Secondary | ICD-10-CM

## 2023-08-04 MED ORDER — ESOMEPRAZOLE MAGNESIUM 40 MG PO CPDR: 40.0000 mg | DELAYED_RELEASE_CAPSULE | Freq: Every day | ORAL | 3 refills | Status: AC

## 2023-08-04 NOTE — Telephone Encounter (Signed)
Pt's pharmacy is requesting a refill on medication esomeprazole. Would Dr. Anne Fu like to refill this non cardiac medication? Please address

## 2023-08-11 ENCOUNTER — Other Ambulatory Visit: Payer: Self-pay | Admitting: Cardiology

## 2023-08-11 DIAGNOSIS — Z951 Presence of aortocoronary bypass graft: Secondary | ICD-10-CM

## 2023-08-11 DIAGNOSIS — I5022 Chronic systolic (congestive) heart failure: Secondary | ICD-10-CM

## 2023-08-11 DIAGNOSIS — R7989 Other specified abnormal findings of blood chemistry: Secondary | ICD-10-CM

## 2023-08-11 DIAGNOSIS — I255 Ischemic cardiomyopathy: Secondary | ICD-10-CM

## 2023-08-11 DIAGNOSIS — Z79899 Other long term (current) drug therapy: Secondary | ICD-10-CM

## 2023-08-11 DIAGNOSIS — I25810 Atherosclerosis of coronary artery bypass graft(s) without angina pectoris: Secondary | ICD-10-CM

## 2023-10-27 DIAGNOSIS — N179 Acute kidney failure, unspecified: Secondary | ICD-10-CM | POA: Diagnosis not present

## 2023-10-27 DIAGNOSIS — N183 Chronic kidney disease, stage 3 unspecified: Secondary | ICD-10-CM | POA: Diagnosis not present

## 2023-10-27 DIAGNOSIS — I129 Hypertensive chronic kidney disease with stage 1 through stage 4 chronic kidney disease, or unspecified chronic kidney disease: Secondary | ICD-10-CM | POA: Diagnosis not present

## 2023-10-27 DIAGNOSIS — E785 Hyperlipidemia, unspecified: Secondary | ICD-10-CM | POA: Diagnosis not present

## 2023-10-27 DIAGNOSIS — Z87442 Personal history of urinary calculi: Secondary | ICD-10-CM | POA: Diagnosis not present

## 2023-10-27 DIAGNOSIS — I255 Ischemic cardiomyopathy: Secondary | ICD-10-CM | POA: Diagnosis not present

## 2023-11-20 DIAGNOSIS — I5022 Chronic systolic (congestive) heart failure: Secondary | ICD-10-CM | POA: Diagnosis not present

## 2023-11-20 DIAGNOSIS — I25119 Atherosclerotic heart disease of native coronary artery with unspecified angina pectoris: Secondary | ICD-10-CM | POA: Diagnosis not present

## 2023-11-20 DIAGNOSIS — I255 Ischemic cardiomyopathy: Secondary | ICD-10-CM | POA: Diagnosis not present

## 2023-11-20 DIAGNOSIS — N1832 Chronic kidney disease, stage 3b: Secondary | ICD-10-CM | POA: Diagnosis not present

## 2023-12-20 DIAGNOSIS — I255 Ischemic cardiomyopathy: Secondary | ICD-10-CM | POA: Diagnosis not present

## 2023-12-20 DIAGNOSIS — I25119 Atherosclerotic heart disease of native coronary artery with unspecified angina pectoris: Secondary | ICD-10-CM | POA: Diagnosis not present

## 2023-12-20 DIAGNOSIS — I5022 Chronic systolic (congestive) heart failure: Secondary | ICD-10-CM | POA: Diagnosis not present

## 2023-12-20 DIAGNOSIS — N1832 Chronic kidney disease, stage 3b: Secondary | ICD-10-CM | POA: Diagnosis not present

## 2024-01-20 DIAGNOSIS — I5022 Chronic systolic (congestive) heart failure: Secondary | ICD-10-CM | POA: Diagnosis not present

## 2024-01-20 DIAGNOSIS — I255 Ischemic cardiomyopathy: Secondary | ICD-10-CM | POA: Diagnosis not present

## 2024-01-20 DIAGNOSIS — I25119 Atherosclerotic heart disease of native coronary artery with unspecified angina pectoris: Secondary | ICD-10-CM | POA: Diagnosis not present

## 2024-01-20 DIAGNOSIS — N1832 Chronic kidney disease, stage 3b: Secondary | ICD-10-CM | POA: Diagnosis not present

## 2024-02-20 DIAGNOSIS — I255 Ischemic cardiomyopathy: Secondary | ICD-10-CM | POA: Diagnosis not present

## 2024-02-20 DIAGNOSIS — I5022 Chronic systolic (congestive) heart failure: Secondary | ICD-10-CM | POA: Diagnosis not present

## 2024-02-20 DIAGNOSIS — I25119 Atherosclerotic heart disease of native coronary artery with unspecified angina pectoris: Secondary | ICD-10-CM | POA: Diagnosis not present

## 2024-02-20 DIAGNOSIS — N1832 Chronic kidney disease, stage 3b: Secondary | ICD-10-CM | POA: Diagnosis not present

## 2024-03-21 DIAGNOSIS — I5022 Chronic systolic (congestive) heart failure: Secondary | ICD-10-CM | POA: Diagnosis not present

## 2024-03-21 DIAGNOSIS — I255 Ischemic cardiomyopathy: Secondary | ICD-10-CM | POA: Diagnosis not present

## 2024-03-21 DIAGNOSIS — N1832 Chronic kidney disease, stage 3b: Secondary | ICD-10-CM | POA: Diagnosis not present

## 2024-03-21 DIAGNOSIS — I25119 Atherosclerotic heart disease of native coronary artery with unspecified angina pectoris: Secondary | ICD-10-CM | POA: Diagnosis not present

## 2024-04-03 DIAGNOSIS — Z0000 Encounter for general adult medical examination without abnormal findings: Secondary | ICD-10-CM | POA: Diagnosis not present

## 2024-04-03 DIAGNOSIS — Z1331 Encounter for screening for depression: Secondary | ICD-10-CM | POA: Diagnosis not present

## 2024-04-03 DIAGNOSIS — Z23 Encounter for immunization: Secondary | ICD-10-CM | POA: Diagnosis not present

## 2024-04-07 NOTE — Progress Notes (Addendum)
 Michael Banks                                          MRN: [id number]   07/11/2024   The [facility] Quality Team Specialist reviewed this patient medical record for the purposes of chart review for care gap closure. The following were reviewed: chart review for care gap closure-diabetic eye exam, glycemic status assessment, and kidney health evaluation for diabetes:eGFR  and uACR.    [facility] Quality Team

## 2024-04-21 DIAGNOSIS — N1832 Chronic kidney disease, stage 3b: Secondary | ICD-10-CM | POA: Diagnosis not present

## 2024-04-21 DIAGNOSIS — I255 Ischemic cardiomyopathy: Secondary | ICD-10-CM | POA: Diagnosis not present

## 2024-04-21 DIAGNOSIS — I25119 Atherosclerotic heart disease of native coronary artery with unspecified angina pectoris: Secondary | ICD-10-CM | POA: Diagnosis not present

## 2024-04-21 DIAGNOSIS — I5022 Chronic systolic (congestive) heart failure: Secondary | ICD-10-CM | POA: Diagnosis not present

## 2024-05-21 DIAGNOSIS — I5022 Chronic systolic (congestive) heart failure: Secondary | ICD-10-CM | POA: Diagnosis not present

## 2024-05-21 DIAGNOSIS — N1832 Chronic kidney disease, stage 3b: Secondary | ICD-10-CM | POA: Diagnosis not present

## 2024-05-21 DIAGNOSIS — I25119 Atherosclerotic heart disease of native coronary artery with unspecified angina pectoris: Secondary | ICD-10-CM | POA: Diagnosis not present

## 2024-05-21 DIAGNOSIS — I255 Ischemic cardiomyopathy: Secondary | ICD-10-CM | POA: Diagnosis not present

## 2024-05-22 ENCOUNTER — Telehealth: Payer: Self-pay | Admitting: Cardiology

## 2024-05-22 ENCOUNTER — Other Ambulatory Visit (HOSPITAL_COMMUNITY): Payer: Self-pay

## 2024-05-22 DIAGNOSIS — I5022 Chronic systolic (congestive) heart failure: Secondary | ICD-10-CM

## 2024-05-22 DIAGNOSIS — Z951 Presence of aortocoronary bypass graft: Secondary | ICD-10-CM

## 2024-05-22 DIAGNOSIS — Z79899 Other long term (current) drug therapy: Secondary | ICD-10-CM

## 2024-05-22 DIAGNOSIS — I25810 Atherosclerosis of coronary artery bypass graft(s) without angina pectoris: Secondary | ICD-10-CM

## 2024-05-22 DIAGNOSIS — I255 Ischemic cardiomyopathy: Secondary | ICD-10-CM

## 2024-05-22 DIAGNOSIS — R7989 Other specified abnormal findings of blood chemistry: Secondary | ICD-10-CM

## 2024-05-22 MED ORDER — SACUBITRIL-VALSARTAN 24-26 MG PO TABS
1.0000 | ORAL_TABLET | Freq: Two times a day (BID) | ORAL | 0 refills | Status: AC
  Filled 2024-05-22: qty 180, 90d supply, fill #0

## 2024-05-22 NOTE — Telephone Encounter (Signed)
*  STAT* If patient is at the pharmacy, call can be transferred to refill team.   1. Which medications need to be refilled? (please list name of each medication and dose if known) sacubitril -valsartan  (ENTRESTO ) 24-26 MG    2. Would you like to learn more about the convenience, safety, & potential cost savings by using the [facility]?    3. Are you open to using the [facility] (Type [facility].  ).   4. Which pharmacy/location (including street and city if local pharmacy) is medication to be sent to? [id number] - [place], [place] - [address]    5. Do they need a 30 day or 90 day supply? 90 day

## 2024-06-30 ENCOUNTER — Other Ambulatory Visit: Payer: Self-pay | Admitting: Cardiology

## 2024-07-03 ENCOUNTER — Ambulatory Visit: Admitting: Cardiology

## 2024-07-08 ENCOUNTER — Other Ambulatory Visit: Payer: Self-pay | Admitting: Cardiology

## 2024-07-23 ENCOUNTER — Other Ambulatory Visit: Payer: Self-pay | Admitting: Cardiology

## 2024-07-26 NOTE — Telephone Encounter (Signed)
 In accordance with refill protocols, please review and address the following requirements before this medication refill can be authorized:  Labs  It states that pt had labs scanned in epic on 10/27/2023, but I don't see it.

## 2024-08-01 ENCOUNTER — Ambulatory Visit: Admitting: Physician Assistant
